# Patient Record
Sex: Female | Born: 1935 | Race: Black or African American | Hispanic: No | Smoking: Former smoker
Health system: Southern US, Community
[De-identification: ages and names within clinical notes are randomized; demographics above are authoritative.]

## PROBLEM LIST (undated history)

## (undated) DIAGNOSIS — K219 Gastro-esophageal reflux disease without esophagitis: Secondary | ICD-10-CM

## (undated) DIAGNOSIS — F329 Major depressive disorder, single episode, unspecified: Secondary | ICD-10-CM

## (undated) DIAGNOSIS — I639 Cerebral infarction, unspecified: Secondary | ICD-10-CM

## (undated) DIAGNOSIS — E215 Disorder of parathyroid gland, unspecified: Secondary | ICD-10-CM

## (undated) DIAGNOSIS — H269 Unspecified cataract: Secondary | ICD-10-CM

## (undated) DIAGNOSIS — J449 Chronic obstructive pulmonary disease, unspecified: Secondary | ICD-10-CM

## (undated) DIAGNOSIS — I10 Essential (primary) hypertension: Secondary | ICD-10-CM

## (undated) DIAGNOSIS — M1610 Unilateral primary osteoarthritis, unspecified hip: Secondary | ICD-10-CM

## (undated) HISTORY — DX: Unilateral primary osteoarthritis, unspecified hip: M16.10

## (undated) HISTORY — DX: Essential (primary) hypertension: I10

## (undated) HISTORY — DX: Major depressive disorder, single episode, unspecified: F32.9

## (undated) HISTORY — DX: Disorder of parathyroid gland, unspecified: E21.5

## (undated) HISTORY — DX: Chronic obstructive pulmonary disease, unspecified: J44.9

## (undated) HISTORY — PX: COLOSTOMY: SHX63

## (undated) HISTORY — DX: Cerebral infarction, unspecified: I63.9

## (undated) HISTORY — DX: Gastro-esophageal reflux disease without esophagitis: K21.9

## (undated) HISTORY — DX: Unspecified cataract: H26.9

---

## 1998-07-26 ENCOUNTER — Other Ambulatory Visit: Admission: RE | Admit: 1998-07-26 | Discharge: 1998-07-26 | Payer: Self-pay | Admitting: Gynecology

## 1999-09-15 ENCOUNTER — Encounter: Admission: RE | Admit: 1999-09-15 | Discharge: 1999-09-15 | Payer: Self-pay | Admitting: Gynecology

## 1999-09-15 ENCOUNTER — Other Ambulatory Visit: Admission: RE | Admit: 1999-09-15 | Discharge: 1999-09-15 | Payer: Self-pay | Admitting: Gynecology

## 1999-10-27 ENCOUNTER — Encounter (INDEPENDENT_AMBULATORY_CARE_PROVIDER_SITE_OTHER): Payer: Self-pay

## 1999-10-27 ENCOUNTER — Ambulatory Visit (HOSPITAL_COMMUNITY): Admission: RE | Admit: 1999-10-27 | Discharge: 1999-10-27 | Payer: Self-pay | Admitting: Gastroenterology

## 2000-09-27 ENCOUNTER — Encounter: Admission: RE | Admit: 2000-09-27 | Discharge: 2000-09-27 | Payer: Self-pay | Admitting: Gynecology

## 2000-09-27 ENCOUNTER — Encounter: Payer: Self-pay | Admitting: Gynecology

## 2000-09-27 ENCOUNTER — Other Ambulatory Visit: Admission: RE | Admit: 2000-09-27 | Discharge: 2000-09-27 | Payer: Self-pay | Admitting: Gynecology

## 2001-10-03 ENCOUNTER — Other Ambulatory Visit: Admission: RE | Admit: 2001-10-03 | Discharge: 2001-10-03 | Payer: Self-pay | Admitting: Gynecology

## 2001-10-03 ENCOUNTER — Encounter: Payer: Self-pay | Admitting: Gynecology

## 2001-10-03 ENCOUNTER — Encounter: Admission: RE | Admit: 2001-10-03 | Discharge: 2001-10-03 | Payer: Self-pay | Admitting: Gynecology

## 2002-01-02 ENCOUNTER — Ambulatory Visit (HOSPITAL_COMMUNITY): Admission: RE | Admit: 2002-01-02 | Discharge: 2002-01-02 | Payer: Self-pay | Admitting: Gastroenterology

## 2002-10-16 ENCOUNTER — Other Ambulatory Visit: Admission: RE | Admit: 2002-10-16 | Discharge: 2002-10-16 | Payer: Self-pay | Admitting: Gynecology

## 2002-10-16 ENCOUNTER — Encounter: Payer: Self-pay | Admitting: Gynecology

## 2002-10-16 ENCOUNTER — Encounter: Admission: RE | Admit: 2002-10-16 | Discharge: 2002-10-16 | Payer: Self-pay | Admitting: Gynecology

## 2003-08-08 ENCOUNTER — Encounter: Admission: RE | Admit: 2003-08-08 | Discharge: 2003-08-08 | Payer: Self-pay | Admitting: Cardiology

## 2003-08-08 ENCOUNTER — Encounter: Payer: Self-pay | Admitting: Cardiology

## 2003-10-22 ENCOUNTER — Encounter: Admission: RE | Admit: 2003-10-22 | Discharge: 2003-10-22 | Payer: Self-pay | Admitting: Gynecology

## 2003-10-22 ENCOUNTER — Other Ambulatory Visit: Admission: RE | Admit: 2003-10-22 | Discharge: 2003-10-22 | Payer: Self-pay | Admitting: Gynecology

## 2004-10-27 ENCOUNTER — Other Ambulatory Visit: Admission: RE | Admit: 2004-10-27 | Discharge: 2004-10-27 | Payer: Self-pay | Admitting: Gynecology

## 2004-10-27 ENCOUNTER — Encounter: Admission: RE | Admit: 2004-10-27 | Discharge: 2004-10-27 | Payer: Self-pay | Admitting: Gynecology

## 2005-01-20 ENCOUNTER — Inpatient Hospital Stay (HOSPITAL_COMMUNITY): Admission: EM | Admit: 2005-01-20 | Discharge: 2005-01-27 | Payer: Self-pay | Admitting: Emergency Medicine

## 2005-03-09 ENCOUNTER — Ambulatory Visit: Payer: Self-pay | Admitting: Family Medicine

## 2005-04-20 ENCOUNTER — Ambulatory Visit: Payer: Self-pay | Admitting: Family Medicine

## 2005-10-28 ENCOUNTER — Other Ambulatory Visit: Admission: RE | Admit: 2005-10-28 | Discharge: 2005-10-28 | Payer: Self-pay | Admitting: Gynecology

## 2005-11-25 ENCOUNTER — Encounter: Admission: RE | Admit: 2005-11-25 | Discharge: 2005-11-25 | Payer: Self-pay | Admitting: Gynecology

## 2006-04-21 ENCOUNTER — Ambulatory Visit: Payer: Self-pay | Admitting: Family Medicine

## 2006-05-04 ENCOUNTER — Encounter: Admission: RE | Admit: 2006-05-04 | Discharge: 2006-05-04 | Payer: Self-pay | Admitting: Family Medicine

## 2006-06-23 ENCOUNTER — Ambulatory Visit: Payer: Self-pay | Admitting: Family Medicine

## 2006-06-30 ENCOUNTER — Ambulatory Visit: Payer: Self-pay | Admitting: Family Medicine

## 2006-10-28 ENCOUNTER — Other Ambulatory Visit: Admission: RE | Admit: 2006-10-28 | Discharge: 2006-10-28 | Payer: Self-pay | Admitting: Gynecology

## 2007-04-11 ENCOUNTER — Ambulatory Visit: Payer: Self-pay | Admitting: Family Medicine

## 2007-04-11 LAB — CONVERTED CEMR LAB
ALT: 16 units/L (ref 0–40)
AST: 23 units/L (ref 0–37)
Basophils Relative: 0.1 % (ref 0.0–1.0)
Bilirubin, Direct: 0.1 mg/dL (ref 0.0–0.3)
CO2: 33 meq/L — ABNORMAL HIGH (ref 19–32)
Calcium: 11.2 mg/dL — ABNORMAL HIGH (ref 8.4–10.5)
Chloride: 104 meq/L (ref 96–112)
Creatinine, Ser: 0.9 mg/dL (ref 0.4–1.2)
Eosinophils Relative: 4.6 % (ref 0.0–5.0)
Glucose, Bld: 99 mg/dL (ref 70–99)
HDL: 49.2 mg/dL (ref >39.0)
Lymphocytes Relative: 23.2 % (ref 12.0–46.0)
Neutro Abs: 3.4 10*3/uL (ref 1.4–7.7)
Platelets: 231 10*3/uL (ref 150–400)
RBC: 4.14 M/uL (ref 3.87–5.11)
Total Bilirubin: 0.6 mg/dL (ref 0.3–1.2)
Total Protein: 7.6 g/dL (ref 6.0–8.3)
VLDL: 27 mg/dL (ref 0–40)
WBC: 5.6 10*3/uL (ref 4.5–10.5)

## 2007-04-19 ENCOUNTER — Ambulatory Visit: Payer: Self-pay | Admitting: Internal Medicine

## 2007-07-08 DIAGNOSIS — F3289 Other specified depressive episodes: Secondary | ICD-10-CM

## 2007-07-08 DIAGNOSIS — F32A Depression, unspecified: Secondary | ICD-10-CM | POA: Insufficient documentation

## 2007-07-08 DIAGNOSIS — K219 Gastro-esophageal reflux disease without esophagitis: Secondary | ICD-10-CM

## 2007-07-08 DIAGNOSIS — I10 Essential (primary) hypertension: Secondary | ICD-10-CM

## 2007-07-08 DIAGNOSIS — F329 Major depressive disorder, single episode, unspecified: Secondary | ICD-10-CM

## 2007-07-08 HISTORY — DX: Gastro-esophageal reflux disease without esophagitis: K21.9

## 2007-07-08 HISTORY — DX: Major depressive disorder, single episode, unspecified: F32.9

## 2007-07-08 HISTORY — DX: Essential (primary) hypertension: I10

## 2007-07-08 HISTORY — DX: Other specified depressive episodes: F32.89

## 2007-10-26 ENCOUNTER — Other Ambulatory Visit: Admission: RE | Admit: 2007-10-26 | Discharge: 2007-10-26 | Payer: Self-pay | Admitting: Gynecology

## 2008-04-18 ENCOUNTER — Ambulatory Visit: Payer: Self-pay | Admitting: Family Medicine

## 2008-04-18 DIAGNOSIS — J4489 Other specified chronic obstructive pulmonary disease: Secondary | ICD-10-CM

## 2008-04-18 DIAGNOSIS — J449 Chronic obstructive pulmonary disease, unspecified: Secondary | ICD-10-CM

## 2008-04-18 DIAGNOSIS — M1610 Unilateral primary osteoarthritis, unspecified hip: Secondary | ICD-10-CM

## 2008-04-18 DIAGNOSIS — H269 Unspecified cataract: Secondary | ICD-10-CM

## 2008-04-18 HISTORY — DX: Chronic obstructive pulmonary disease, unspecified: J44.9

## 2008-04-18 HISTORY — DX: Unspecified cataract: H26.9

## 2008-04-18 HISTORY — DX: Other specified chronic obstructive pulmonary disease: J44.89

## 2008-04-18 HISTORY — DX: Unilateral primary osteoarthritis, unspecified hip: M16.10

## 2008-04-18 LAB — CONVERTED CEMR LAB
Albumin: 3.9 g/dL (ref 3.5–5.2)
Alkaline Phosphatase: 68 units/L (ref 39–117)
BUN: 15 mg/dL (ref 6–23)
Calcium: 11.3 mg/dL — ABNORMAL HIGH (ref 8.4–10.5)
Eosinophils Absolute: 0.2 10*3/uL (ref 0.0–0.7)
Eosinophils Relative: 3.4 % (ref 0.0–5.0)
GFR calc Af Amer: 78 mL/min
GFR calc non Af Amer: 65 mL/min
HCT: 38.2 % (ref 36.0–46.0)
MCV: 94.6 fL (ref 78.0–100.0)
Monocytes Absolute: 0.6 10*3/uL (ref 0.1–1.0)
Neutro Abs: 4.2 10*3/uL (ref 1.4–7.7)
Platelets: 218 10*3/uL (ref 150–400)
Potassium: 4.1 meq/L (ref 3.5–5.1)
Protein, U semiquant: NEGATIVE
RDW: 14.4 % (ref 11.5–14.6)
Sodium: 141 meq/L (ref 135–145)
TSH: 1.01 microintl units/mL (ref 0.35–5.50)
Urobilinogen, UA: 0.2
WBC Urine, dipstick: NEGATIVE

## 2008-05-09 ENCOUNTER — Ambulatory Visit: Payer: Self-pay | Admitting: Family Medicine

## 2008-06-18 ENCOUNTER — Telehealth: Payer: Self-pay | Admitting: *Deleted

## 2008-10-29 ENCOUNTER — Encounter: Admission: RE | Admit: 2008-10-29 | Discharge: 2008-10-29 | Payer: Self-pay | Admitting: Gynecology

## 2009-08-07 ENCOUNTER — Telehealth: Payer: Self-pay | Admitting: Family Medicine

## 2009-10-09 ENCOUNTER — Encounter (INDEPENDENT_AMBULATORY_CARE_PROVIDER_SITE_OTHER): Payer: Self-pay | Admitting: *Deleted

## 2009-10-09 ENCOUNTER — Ambulatory Visit: Payer: Self-pay | Admitting: Family Medicine

## 2009-10-09 LAB — CONVERTED CEMR LAB
Bilirubin Urine: NEGATIVE
Glucose, Urine, Semiquant: NEGATIVE
Ketones, urine, test strip: NEGATIVE
Specific Gravity, Urine: 1.02
Urobilinogen, UA: 0.2
pH: 6

## 2009-10-10 ENCOUNTER — Ambulatory Visit: Payer: Self-pay | Admitting: Family Medicine

## 2009-10-10 LAB — CONVERTED CEMR LAB
ALT: 15 units/L (ref 0–35)
AST: 24 units/L (ref 0–37)
Albumin: 4.2 g/dL (ref 3.5–5.2)
Alkaline Phosphatase: 66 units/L (ref 39–117)
Basophils Absolute: 0 10*3/uL (ref 0.0–0.1)
Calcium: 11.5 mg/dL — ABNORMAL HIGH (ref 8.4–10.5)
Eosinophils Relative: 4.1 % (ref 0.0–5.0)
GFR calc non Af Amer: 89.3 mL/min (ref >60)
HCT: 39.5 % (ref 36.0–46.0)
Hemoglobin: 13.3 g/dL (ref 12.0–15.0)
Lymphocytes Relative: 25 % (ref 12.0–46.0)
Lymphs Abs: 1.2 10*3/uL (ref 0.7–4.0)
Monocytes Relative: 10.1 % (ref 3.0–12.0)
Neutro Abs: 2.8 10*3/uL (ref 1.4–7.7)
Potassium: 4.4 meq/L (ref 3.5–5.1)
RBC: 3.99 M/uL (ref 3.87–5.11)
Sodium: 139 meq/L (ref 135–145)
TSH: 0.9 microintl units/mL (ref 0.35–5.50)
WBC: 4.7 10*3/uL (ref 4.5–10.5)

## 2009-10-18 LAB — CONVERTED CEMR LAB
Calcium, Total (PTH): 11.8 mg/dL — ABNORMAL HIGH (ref 8.4–10.5)
PTH: 67.6 pg/mL (ref 14.0–72.0)

## 2010-01-20 ENCOUNTER — Ambulatory Visit: Payer: Self-pay | Admitting: Family Medicine

## 2010-01-20 LAB — CONVERTED CEMR LAB
Calcium, Total (PTH): 11.4 mg/dL — ABNORMAL HIGH (ref 8.4–10.5)
PTH: 76.1 pg/mL — ABNORMAL HIGH (ref 14.0–72.0)
Phosphorus: 3.4 mg/dL (ref 2.3–4.6)

## 2010-01-21 ENCOUNTER — Telehealth: Payer: Self-pay | Admitting: Family Medicine

## 2010-02-17 ENCOUNTER — Ambulatory Visit (HOSPITAL_COMMUNITY): Admission: RE | Admit: 2010-02-17 | Discharge: 2010-02-17 | Payer: Self-pay | Admitting: Surgery

## 2010-03-11 ENCOUNTER — Telehealth: Payer: Self-pay | Admitting: Family Medicine

## 2010-09-03 ENCOUNTER — Telehealth: Payer: Self-pay | Admitting: Family Medicine

## 2010-10-06 ENCOUNTER — Ambulatory Visit (HOSPITAL_COMMUNITY): Admission: RE | Admit: 2010-10-06 | Discharge: 2010-10-06 | Payer: Self-pay | Admitting: Surgery

## 2010-11-03 ENCOUNTER — Encounter: Payer: Self-pay | Admitting: Family Medicine

## 2010-11-03 ENCOUNTER — Ambulatory Visit: Payer: Self-pay | Admitting: Family Medicine

## 2010-11-03 LAB — CONVERTED CEMR LAB
Bilirubin Urine: NEGATIVE
Glucose, Urine, Semiquant: NEGATIVE
Ketones, urine, test strip: NEGATIVE
Nitrite: NEGATIVE
Protein, U semiquant: NEGATIVE
Specific Gravity, Urine: 1.02
Urobilinogen, UA: 0.2
pH: 6

## 2010-11-04 LAB — CONVERTED CEMR LAB
AST: 24 units/L (ref 0–37)
Alkaline Phosphatase: 94 units/L (ref 39–117)
BUN: 25 mg/dL — ABNORMAL HIGH (ref 6–23)
Basophils Absolute: 0.1 10*3/uL (ref 0.0–0.1)
Bilirubin, Direct: 0 mg/dL (ref 0.0–0.3)
Calcium: 12.1 mg/dL — ABNORMAL HIGH (ref 8.4–10.5)
Eosinophils Relative: 4.3 % (ref 0.0–5.0)
GFR calc non Af Amer: 52.72 mL/min (ref >30.00)
Glucose, Bld: 94 mg/dL (ref 70–99)
Lymphocytes Relative: 23.3 % (ref 12.0–46.0)
Lymphs Abs: 1.5 10*3/uL (ref 0.7–4.0)
Monocytes Relative: 10 % (ref 3.0–12.0)
Neutrophils Relative %: 61.6 % (ref 43.0–77.0)
Platelets: 233 10*3/uL (ref 150.0–400.0)
RDW: 14 % (ref 11.5–14.6)
TSH: 0.87 microintl units/mL (ref 0.35–5.50)
Total Bilirubin: 0.6 mg/dL (ref 0.3–1.2)
WBC: 6.3 10*3/uL (ref 4.5–10.5)

## 2010-11-07 ENCOUNTER — Encounter: Payer: Self-pay | Admitting: Family Medicine

## 2010-11-07 ENCOUNTER — Ambulatory Visit: Payer: Self-pay | Admitting: Family Medicine

## 2010-11-14 DIAGNOSIS — E215 Disorder of parathyroid gland, unspecified: Secondary | ICD-10-CM

## 2010-11-14 HISTORY — DX: Disorder of parathyroid gland, unspecified: E21.5

## 2010-11-14 LAB — CONVERTED CEMR LAB: Calcium, Total (PTH): 11.6 mg/dL — ABNORMAL HIGH (ref 8.4–10.5)

## 2010-12-30 NOTE — Progress Notes (Signed)
Summary: fyi  Phone Note Call from Patient Call back at Home Phone [phone]   Caller: Patient-----voicemail Summary of Call: Need to speak to nurse about some tests? Initial call taken by: Warnell Forester,  March 11, 2010 3:54 PM  Follow-up for Phone Call        patient is calling for results from Dr Tenna Child office.  Additional Follow-up for Phone Call Additional follow up Details #1::        I advised patient to call Dr Tenna Child office in the morning. Additional Follow-up by: Kern Reap CMA Duncan Dull),  March 11, 2010 5:10 PM

## 2010-12-30 NOTE — Progress Notes (Signed)
  Phone Note Outgoing Call   Summary of Call: Adriana Middleton's calcium is elevated, and her PTH is increasing, therefore, referred to Dr. Cicero Duck for consultation regarding hyperparathyroidism Initial call taken by: Roderick Pee MD,  January 21, 2010 1:39 PM

## 2010-12-30 NOTE — Assessment & Plan Note (Signed)
Summary: emp/pt fasting/cjr/pt rescd from bump//ccm   Vital Signs:  Patient profile:   77 year old female Menstrual status:  postmenopausal Height:      61.5 inches Weight:      152 pounds BMI:     28.36 O2 Sat:      97 % on Room air Temp:     98.2 degrees F oral Pulse rate:   72 / minute BP sitting:   140 / 88  (left arm) Cuff size:   regular  Vitals Entered By: Kathlene November LPN (November 03, 2010 10:13 AM)  O2 Flow:  Room air CC: CPE no pap   CC:  CPE no pap.  History of Present Illness: Nil is a 77 year old female, who comes in today for evaluation of hypertension.  She takes Tenoretic one half tab daily BP 140/88.  She states she had a Pap smear a week ago, however, she 77 years old.  Has had no history of GYN problems, and, therefore, really doesn't need a Pap.  She states she gets routine eye care, dental care, last mammogram two years ago, does not check her breasts monthly, declines any flu shot tetanus or Pneumovax, and basically says she doesn't want to be examined.  She does want her medicine renewed.  I explained to her that does not have we do things that we do a complete physical examination.  She has a history of vague allergies and still declines to take any vaccinations  Current Medications (verified): 1)  Aspirin 81 Mg  Tabs (Aspirin) .... Take 1 Tablet By Mouth Once A Day 2)  Tylenol 325 Mg  Tabs (Acetaminophen) .... As Needed 3)  B-12 250 Mcg  Tabs (Cyanocobalamin) .... Once Daily 4)  Tenoretic 50 50-25 Mg  Tabs (Atenolol-Chlorthalidone) .... 1/2 Qam 5)  Multivitamins  Tabs (Multiple Vitamin) .... Once Daily  Allergies (verified): 1)  ! Codeine 2)  ! Zocor  Comments:  Nurse/Medical Assistant: The patient's medications and allergies were reviewed with the patient and were updated in the Medication and Allergy Lists. Kathlene November LPN (November 03, 2010 10:14 AM)  Past History:  Past medical, surgical, family and social histories (including risk  factors) reviewed, and no changes noted (except as noted below).  Past Medical History: Reviewed history from 07/08/2007 and no changes required. GERD Hypertension Depression  Past Surgical History: Reviewed history from 07/08/2007 and no changes required. Colostomy-partial  Family History: Reviewed history from 07/08/2007 and no changes required. Family History Diabetes 1st degree relative Family History Hypertension Family History of Prostate CA 1st degree relative <50 Family History of Cardiovascular disorder  Social History: Reviewed history from 07/08/2007 and no changes required. Retired Former Smoker Alcohol use-yes Drug use-no Regular exercise-no  Review of Systems      See HPI  Physical Exam  General:  Well-developed,well-nourished,in no acute distress; alert,appropriate and cooperative throughout examination Head:  Normocephalic and atraumatic without obvious abnormalities. No apparent alopecia or balding. Eyes:  No corneal or conjunctival inflammation noted. EOMI. Perrla. Funduscopic exam benign, without hemorrhages, exudates or papilledema. Vision grossly normal. Ears:  External ear exam shows no significant lesions or deformities.  Otoscopic examination reveals clear canals, tympanic membranes are intact bilaterally without bulging, retraction, inflammation or discharge. Hearing is grossly normal bilaterally. Nose:  External nasal examination shows no deformity or inflammation. Nasal mucosa are pink and moist without lesions or exudates. Mouth:  Oral mucosa and oropharynx without lesions or exudates.  Teeth in good repair. Neck:  No deformities, masses, or tenderness noted. Chest Wall:  No deformities, masses, or tenderness noted. Breasts:  No mass, nodules, thickening, tenderness, bulging, retraction, inflamation, nipple discharge or skin changes noted.   Lungs:  Normal respiratory effort, chest expands symmetrically. Lungs are clear to auscultation, no crackles  or wheezes. Heart:  Normal rate and regular rhythm. S1 and S2 normal without gallop, murmur, click, rub or other extra sounds. Abdomen:  Bowel sounds positive,abdomen soft and non-tender without masses, organomegaly or hernias noted. Msk:  No deformity or scoliosis noted of thoracic or lumbar spine.   Pulses:  R and L carotid,radial,femoral,dorsalis pedis and posterior tibial pulses are full and equal bilaterally Extremities:  No clubbing, cyanosis, edema, or deformity noted with normal full range of motion of all joints.   Neurologic:  No cranial nerve deficits noted. Station and gait are normal. Plantar reflexes are down-going bilaterally. DTRs are symmetrical throughout. Sensory, motor and coordinative functions appear intact. Skin:  Intact without suspicious lesions or rashes Cervical Nodes:  No lymphadenopathy noted Axillary Nodes:  No palpable lymphadenopathy Inguinal Nodes:  No significant adenopathy Psych:  Cognition and judgment appear intact. Alert and cooperative with normal attention span and concentration. No apparent delusions, illusions, hallucinations   Impression & Recommendations:  Problem # 1:  HYPERTENSION (ICD-401.9) Assessment Improved  Her updated medication list for this problem includes:    Tenoretic 50 50-25 Mg Tabs (Atenolol-chlorthalidone) .Marland Kitchen... 1/2 qam  Orders: Venipuncture (04540) Prescription Created Electronically [phone]) Urinalysis-dipstick only (Medicare patient) (14782NF) Specimen Handling (62130) TLB-BMP (Basic Metabolic Panel-BMET) (80048-METABOL) TLB-CBC Platelet - w/Differential (85025-CBCD) TLB-Hepatic/Liver Function Pnl (80076-HEPATIC) TLB-TSH (Thyroid Stimulating Hormone) (84443-TSH)  Complete Medication List: 1)  Aspirin 81 Mg Tabs (Aspirin) .... Take 1 tablet by mouth once a day 2)  Tylenol 325 Mg Tabs (Acetaminophen) .... As needed 3)  B-12 250 Mcg Tabs (Cyanocobalamin) .... Once daily 4)  Tenoretic 50 50-25 Mg Tabs  (Atenolol-chlorthalidone) .... 1/2 qam 5)  Multivitamins Tabs (Multiple vitamin) .... Once daily  Patient Instructions: 1)  continue current medications follow-up in one year. 2)  I would recommend you update your vaccinations. 3)  Take calcium and vitamin D, and aspirin daily for bone strength 4)  Schedule your mammogram. 5)  Schedule a colonoscopy/sigmoidoscopy to help detect colon cancer. Prescriptions: TENORETIC 50 50-25 MG  TABS (ATENOLOL-CHLORTHALIDONE) 1/2 qam  #100 x 1   Entered and Authorized by:   Roderick Pee MD   Signed by:   Roderick Pee MD on 11/03/2010   Method used:   Electron[facility] to        [facility] [phone]* (retail)       [address]       [place], [place]  [address]       Ph: [phone] or [phone]       Fax: [phone]   RxID:   [id number]    Orders Added: 1)  Venipuncture [60630] 2)  Prescription Created Electronically [G8553] 3)  Est. Patient Level IV [16010] 4)  Urinalysis-dipstick only (Medicare patient) [81003QW] 5)  Specimen Handling [99000] 6)  TLB-BMP (Basic Metabolic Panel-BMET) [80048-METABOL] 7)  TLB-CBC Platelet - w/Differential [85025-CBCD] 8)  TLB-Hepatic/Liver Function Pnl [80076-HEPATIC] 9)  TLB-TSH (Thyroid Stimulating Hormone) [84443-TSH]    Laboratory Results   Urine Tests    Routine Urinalysis   Color: yellow Appearance: Clear Glucose: negative   (Normal Range: Negative) Bilirubin: negative   (Normal Range: Negative) Ketone: negative   (  Normal Range: Negative) Spec. Gravity: 1.020   (Normal Range: 1.003-1.035) Blood: trace-lysed   (Normal Range: Negative) pH: 6.0   (Normal Range: 5.0-8.0) Protein: negative   (Normal Range: Negative) Urobilinogen: 0.2   (Normal Range: 0-1) Nitrite: negative   (Normal Range: Negative) Leukocyte Esterace: 1+   (Normal Range: Negative)    Comments: Rita Ohara  November 03, 2010 1:51 PM

## 2010-12-30 NOTE — Progress Notes (Signed)
Summary: Refill Atenolol-Chlorthal  Phone Note From Pharmacy      Prescriptions: TENORETIC 50 50-25 MG  TABS (ATENOLOL-CHLORTHALIDONE) 1/2 qam  #100 x 1   Entered by:   Kathrynn Speed CMA   Authorized by:   Roderick Pee MD   Signed by:   Kathrynn Speed CMA on 09/03/2010   Method used:   [facility]        [facility]  [address] [phone]* (retail)       [address]       [place], [place]  [address]       Ph: [phone] or [phone]       Fax: [phone]   RxID:   [id number]

## 2011-03-30 ENCOUNTER — Other Ambulatory Visit (HOSPITAL_COMMUNITY): Payer: Self-pay | Admitting: Ophthalmology

## 2011-03-30 ENCOUNTER — Encounter (HOSPITAL_COMMUNITY)
Admission: RE | Admit: 2011-03-30 | Discharge: 2011-03-30 | Disposition: A | Discharge status: Home / Self Care | Payer: Medicare Other | Source: Ambulatory Visit | Attending: Ophthalmology | Admitting: Ophthalmology

## 2011-03-30 ENCOUNTER — Ambulatory Visit (HOSPITAL_COMMUNITY)
Admission: RE | Admit: 2011-03-30 | Discharge: 2011-03-30 | Disposition: A | Discharge status: Home / Self Care | Payer: Medicare Other | Source: Ambulatory Visit | Attending: Ophthalmology | Admitting: Ophthalmology

## 2011-03-30 DIAGNOSIS — H269 Unspecified cataract: Secondary | ICD-10-CM | POA: Insufficient documentation

## 2011-03-30 DIAGNOSIS — Z01818 Encounter for other preprocedural examination: Secondary | ICD-10-CM | POA: Insufficient documentation

## 2011-03-30 DIAGNOSIS — Z01812 Encounter for preprocedural laboratory examination: Secondary | ICD-10-CM | POA: Insufficient documentation

## 2011-03-30 DIAGNOSIS — I517 Cardiomegaly: Secondary | ICD-10-CM | POA: Insufficient documentation

## 2011-03-30 LAB — BASIC METABOLIC PANEL
Chloride: 102 mEq/L (ref 96–112)
GFR calc non Af Amer: 41 mL/min — ABNORMAL LOW (ref >60)
Glucose, Bld: 93 mg/dL (ref 70–99)
Potassium: 3.8 mEq/L (ref 3.5–5.1)
Sodium: 138 mEq/L (ref 135–145)

## 2011-03-30 LAB — CBC
HCT: 40.7 % (ref 36.0–46.0)
RBC: 4.36 MIL/uL (ref 3.87–5.11)
RDW: 13.5 % (ref 11.5–15.5)
WBC: 7.5 10*3/uL (ref 4.0–10.5)

## 2011-04-01 ENCOUNTER — Ambulatory Visit (HOSPITAL_COMMUNITY)
Admission: RE | Admit: 2011-04-01 | Discharge: 2011-04-01 | Disposition: A | Discharge status: Home / Self Care | Payer: Medicare Other | Source: Ambulatory Visit | Attending: Ophthalmology | Admitting: Ophthalmology

## 2011-04-01 DIAGNOSIS — J449 Chronic obstructive pulmonary disease, unspecified: Secondary | ICD-10-CM | POA: Insufficient documentation

## 2011-04-01 DIAGNOSIS — I10 Essential (primary) hypertension: Secondary | ICD-10-CM | POA: Insufficient documentation

## 2011-04-01 DIAGNOSIS — H269 Unspecified cataract: Secondary | ICD-10-CM | POA: Insufficient documentation

## 2011-04-01 DIAGNOSIS — J4489 Other specified chronic obstructive pulmonary disease: Secondary | ICD-10-CM | POA: Insufficient documentation

## 2011-04-14 NOTE — Assessment & Plan Note (Signed)
[facility]                             PULMONARY OFFICE NOTE   NAME:Adriana Middleton, Adriana Middleton                         MRN:          [id number]  DATE:04/19/2007                            DOB:          07/17/1930    REASON FOR CONSULTATION:  Cough/dyspnea.   HISTORY:  A 77 year old black female states she has had problems with  seasonal rhinitis for years associated with a cough immediately after  lying down that has been waxing and waning for the last 5-10 years.  The  cough waxes and wanes but has been worse this spring than ever but now  it is all better.  When it is bad, she coughs so hard she gags and  vomits and also notices urinary incontinence.  She also loses her breath  during coughing paroxysms.   The patient denies any improvement with Claritin.  Has not been treated  with prednisone or PPI therapy.   PAST MEDICAL HISTORY:  Significant for hypertension.  Patient denies  that she is on any ACE inhibitors.   ALLERGIES:  None known.   Medications include Celexa  and Tenoretic.   SOCIAL HISTORY:  She quit smoking eight years ago and had no pulmonary  symptoms then.  She is retired with no unusual travel, pet, or hobby  exposure.   FAMILY HISTORY:  Recorded in detail and negative for atypia or asthma.   REVIEW OF SYSTEMS:  Taken in detail on the work sheet and essentially  negative except as outlined above.   PHYSICAL EXAMINATION:  GENERAL:  This is a pleasant ambulatory black  female in no acute distress.  VITAL SIGNS:  She is afebrile with normal vital signs.  HEENT:  Minimal turbinate edema but no cyanosis, pallor, or polyps.  Oropharynx is clear.  No evidence of excessive postnasal drainage.  NECK:  Supple without cervical adenopathy or tenderness.  Trachea is  midline.  No thyromegaly.  LUNGS:  Perfectly clear bilaterally to auscultation and percussion.  HEART:  There is a regular rhythm without murmur, rub or gallop.  ABDOMEN:  Soft,  benign.  EXTREMITIES:  Warm without calf tenderness, clubbing, cyanosis or edema.   X-ray is not available for review.   IMPRESSION:  A chronic cough that dates back 5-10 years and waxes and  wanes with seasonal variation to initially suggest an allergic  mechanism; however, the more I talk with the patient, the more vague she  became about the exact setting in which she notes aggravation of chronic  cough.   DISCUSSION:  She decided that the cough comes and goes with no pattern  at all.  The fact that it does not respond to antihistamines rules  against allergic postnasal drip syndrome, although note the  antihistamines she has used are the newer generation antihistamines with  no anticholinergic properties, and therefore, we have not totally  excluded an intermittent postnasal drip syndrome from the differential.  The most likely explanation for the cough, although she does have mild  seasonal rhinitis, she has developed reflux as a secondary problem and  needs to  therefore try the following strategy:  Since she is better  now, all I would recommend is that she observe a prudent diet, which  includes the avoidance of cough drops (because they frequently contain  mint and/or menthol) and follow the diet I reviewed with her.  If the  cough begins to bother her, I have recommended a trial of Prilosec 20 mg  dose 30 minutes before supper (because the cough seems worse in the  evening) for at least a two week trial and then return here at the end  of two weeks if not impressed that it is helping.  One option might be  to proceed directly to a methacholine challenge test in that event (the  other option would be to add an anticholinergic antihistamine by  chlorpheniramine or brompheniramine, as per the National Cough  Guidelines, to rule out postnasal drip syndrome in the differential).     Charlaine Dalton. Sherene Sires, MD, [facility]  Electronically Signed    MBW/MedQ  DD: 04/19/2007  DT: 04/20/2007   Job #: [id number]   cc:   Tinnie Gens A. Tawanna Cooler, MD

## 2011-04-15 NOTE — Op Note (Signed)
  [name], Adriana Middleton                  ACCOUNT NO.:  [id number]  MEDICAL RECORD NO.:  [id number]           PATIENT TYPE:  O  LOCATION:  SDSC                         FACILITY:  [facility]  PHYSICIAN:  Shade Flood, MD       DATE OF BIRTH:  01/29/1930  DATE OF PROCEDURE:  04/01/2011 DATE OF DISCHARGE:  04/01/2011                              OPERATIVE REPORT   PREOPERATIVE DIAGNOSIS:  Cataract, right eye.  POSTOPERATIVE DIAGNOSIS:  Cataract, right eye.  PROCEDURE PERFORMED:  Phacoemulsification with posterior chamber intraocular lens, right eye.  There were no complications.  No specimens for pathology.  The patient was prepared and draped in the usual fashion for ocular surgery on the right eye and a solid lid speculum was placed.  A peripheral clear corneal groove was made with a Grieshaber 01 blade and then the keratome was used to make a shelving self-sealing incision into the anterior chamber.  Provisc was instilled and a bent 26-gauge needlewas used to perform a capsulorrhexis.  The lens was hydrodissected using balanced salt solution and the Chang chopper was used to rotate the lens within the capsular bag to ensure adequate mobility.  A separate stab incision was made at that 2:30 meridian of the cornea and the Chang chopper and phaco handpiece were inserted.  A combined phaco/chop technique was employed fracturing the lens into 4 sections and each section was then removed with the phacoemulsification handpiece.    The irrigation-aspiration cannula was then used to remove cortex from the capsular bag.  Provisc was then again instilled to distend the posterior capsule bag and deepen the anterior chamber.  The Monarch injector was then used to place folded AcrySof MA50BM intraocular lens into the capsular bag.  The trailing haptic was dialed in with the Sinskey lens hook.  The IA cannula was then used to remove viscoelastic from the anterior chamber and the wound was closed with an  interrupted 10-0 nylon suture.  The patient was given 100 mg of Ancef in the subconjunctival space with the needle directly visualized and then, the patient was additionally given 4 mg of Decadron in the subconjunctival space with the need directly visualized.  The lid speculum was removed and the patient's eye was patched with polymyxin/bacitracin ointment.  A plastic shield was placed and she was transferred alert and conversant from the operating room to the short-stay recovery area.          ______________________________ Shade Flood, MD     GG/MEDQ  D:  04/01/2011  T:  04/02/2011  Job:  [id number]  Electronically Signed by Shade Flood MD on 04/15/2011 08:50:38 AM

## 2011-04-17 NOTE — H&P (Signed)
Adriana Middleton, Adriana Middleton                  ACCOUNT NO.:  [id number]   MEDICAL RECORD NO.:  [id number]          PATIENT TYPE:  EMS   LOCATION:  MAJO                         FACILITY:  [facility]   PHYSICIAN:  Sharlet Salina T. Hoxworth, M.D.DATE OF BIRTH:  1930/06/20   DATE OF ADMISSION:  01/20/2005  DATE OF DISCHARGE:                                HISTORY & PHYSICAL   CHIEF COMPLAINT:  Abdominal pain.   HISTORY OF PRESENT ILLNESS:  Adriana Middleton is a 77 year old black female who  presents to the [facility] Emergency Room with a 24 hour history of  abdominal pain.  She was awakened last night with lower abdominal pain and  had frequent nausea and vomiting.  The pain continued.  She presented today.  She describes pain across her lower abdomen, right and left.  She remains  nauseated.  No fever or chills.  She has a history of diverticulitis and  suspected she had the same problem.  She has not had any recent abdominal  pain, change of bowel habits.   PAST MEDICAL HISTORY:   SURGERY:  1.  Status post colectomy for diverticulitis, about 1994.  2.  She states she has also had a laparotomy for postoperative bowel      obstruction in 1995.  3.  She has had lumbar back surgery x 2.   MEDICALLY:  She is treated for hypertension.   Her primary is Dr. Shana Chute.   MEDICATIONS:  Avalide 12.5/300, one daily.   ALLERGIES:  CODEINE.   SOCIAL HISTORY:  She is single.  Does not smoke cigarettes.  Drinks an  occasional drink.   FAMILY HISTORY:  Noncontributory.   REVIEW OF SYSTEMS:  GENERAL:  No fever, chills, malaise, weight change.  RESPIRATORY:  Denies shortness of breath, cough, wheezing.  CARDIAC:  Denies  cardiac disease, chest pain, palpitations.  ABDOMEN:  GI as above.  GU:  No  urinary burning, frequency, blood.  MUSCULOSKELETAL:  Positive for chronic  joint pain.   PHYSICAL EXAMINATION:  VITAL SIGNS:  Temperature is 99, pulse 93,  respirations 18, blood pressure 152/85.  GENERAL:  A mildly  overweight, alert, black female in no acute distress.  SKIN:  Warm and dry without rash or infection.  HEENT:  No palpable masses or thyromegaly.  Sclerae nonicteric.  LUNGS:  Clear to auscultation without increased work of breathing or  wheezing.  CARDIAC:  Regular rate and rhythm without murmurs.  No edema.  No JVD.  LYMPH NODES:  No cervical, supraclavicular, or inguinal nodes palpable.  ABDOMEN:  Mildly distended.  Well healed incision.  No hernias.  There is  moderate lower abdominal tenderness, greatest in the midline, equal  bilaterally with some guarding but no peritoneal signs, no masses, no  organomegaly appreciated.  EXTREMITIES:  No joint swelling, deformity.  NEUROLOGIC:  Alert and oriented.  Motor and sensory exams are grossly  normal.   LABORATORY:  Electrolytes, LFTs normal.  CBC normal.  Urinalysis negative.  Flat and upright abdomen with chest x-ray negative.  CT scan of the abdomen  and pelvis is reviewed which  shows definite evidence of acute appendicitis  without apparent abscess or perforation.   ASSESSMENT:  Acute appendicitis.   PLAN:  The patient will require appendectomy.  With her previous extensive  abdominal surgery, we will plan to do this with an open right lower quadrant  incision.  She is admitted for this procedure.  She has received broad  spectrum antibiotics.      BTH/MEDQ  D:  01/20/2005  T:  01/20/2005  Job:  [id number]

## 2011-04-17 NOTE — Discharge Summary (Signed)
[name], Adriana Middleton                  ACCOUNT NO.:  [id number]   MEDICAL RECORD NO.:  [id number]          PATIENT TYPE:  INP   LOCATION:  5736                         FACILITY:  [facility]   PHYSICIAN:  Guy Franco, P.A.       DATE OF BIRTH:  1930/05/17   DATE OF ADMISSION:  01/20/2005  DATE OF DISCHARGE:  01/27/2005                                 DISCHARGE SUMMARY   DISCHARGE DIAGNOSES:  1.  Small bowel obstruction, status post laparotomy and lysis of adhesions      for small bowel obstruction.  2.  Hypertension.  3.  History of diverticulosis in 1994 with colectomy.  4.  History of postoperative bowel obstruction in 1995 with subsequent      laparotomy.  5.  Status post back surgery times two.   HOSPITAL COURSE:  Adriana Middleton is a 77 year old female who presented to [facility] with a 24 hour history of abdominal pain, nausea, and vomiting. Her  pain was diffusely over the lower right quadrant. She does have a history of  diverticulitis and the patient thought she may have the same problem. CT  scan of the abdomen and pelvis were reviewed and showed definite evidence of  acute appendicitis with no apparent abscess or perforation. A flat and  upright abdomen was negative.   The patient went to the operating room under the care of Dr. Glenna Fellows. Intraoperatively, she was found to have a small bowel obstruction  and underwent a laparotomy and lysis of adhesions for such obstruction.  Intraoperatively, it was also noted that the appendix had been previously  removed.   The patient was taken to the recovery room in stable condition. Over the  next several days she began to eat and gradually began functioning with good  flatus and she was able to eat.   By postoperative day #7, she was felt to be ready for discharge home. She  was eating without problems, she was walking without problems, and was not  in any pain.   Laboratory studies during her stay included a sodium of 132,  potassium 3.5,  BUN 4, creatinine 0.8, white count 6.5, hemoglobin 11.9, hematocrit 34.4,  platelet count 268,000.   DISCHARGE MEDICATIONS:  She is to resume her Avalide one tablet daily, may  use Tylenol as needed for pain.   No straining or lifting over 10 pounds. Remain on a low fat diet. Clean her  incision with soap and water. No scrubbing. She is to call [phone] for  questions or concerns. She is to follow up with Dr. Odie Sera on February 19, 2005, at 10:30 a.m.      LB/MEDQ  D:  01/27/2005  T:  01/27/2005  Job:  [id number]   cc:   Lorne Skeens. Hoxworth, M.D.  [address]  [place]  [place] [address]   Osvaldo Shipper. Spruill, M.D.  [address] [address]  [place]  [place] [address]  Fax: [phone]

## 2011-04-17 NOTE — Op Note (Signed)
Adriana Middleton, Adriana Middleton                  ACCOUNT NO.:  [id number]   MEDICAL RECORD NO.:  [id number]          PATIENT TYPE:  INP   LOCATION:  1850                         FACILITY:  [facility]   PHYSICIAN:  Sharlet Salina T. Hoxworth, M.D.DATE OF BIRTH:  25-Jun-1930   DATE OF PROCEDURE:  01/20/2005  DATE OF DISCHARGE:                                 OPERATIVE REPORT   PREOPERATIVE DIAGNOSIS:  Acute appendicitis.   POSTOPERATIVE DIAGNOSIS:  Small-bowel obstruction.   SURGICAL PROCEDURES:  Laparotomy and lysis of adhesions for small bowel  obstruction.   SURGEON:  Lorne Skeens. Hoxworth, M.D.   ASSISTANT:  Gita Kudo, M.D.   ANESTHESIA:  General.   BRIEF HISTORY:  Adriana Middleton is a 77 year old black female with a previous  history of diverticulitis with colon resection and then laparotomy for small  bowel obstruction some years ago.  She presents with 24 hours of history of  diffuse lower abdominal pain and nausea and vomiting.  She has had a CT scan  from the emergency room has been interpreted as showing acute appendicitis.  She has fairly diffuse lower abdominal tenderness.  With these findings, I  recommended proceeding with appendectomy.  The nature of the  procedure,  indications, risks of bleeding, infection were discussed and understood  preoperatively.  She was brought out through this procedure.   DESCRIPTION OF OPERATION:  The patient was brought to the operating room and  placed in supine position on the operating table and general endotracheal  anesthesia was induced.  She received preoperative broad-spectrum  antibiotics.  The abdomen was widely sterilely prepped and draped.  An oblique incision  was made over McBurney's point and dissection carried down through  subcutaneous tissue.  External oblique was divided along the lines of its  fibers.  The internal oblique was bluntly split along the lines of its  fibers and the transversus peritoneum elevated and the peritoneum  entered  under direct vision.  There was a moderate amount of straw-colored  peritoneal fluid.  The cecum was brought up into the wound and it was seen  that the appendix had been previously removed.  There were quite a few  adhesions in the lower abdomen that made exploration through this small  right lower quadrant incision difficult, but I was able to see that there  were some quite thickened loops of small bowel with adhesions down into the  pelvis and some more dilated proximal small bowel.  In the operating room  the CT scan was reviewed again with the radiologist on the phone, and it was  felt that what had been identified as the appendix on the CT scan likely was  a thickened loop of terminal ileum and that there was moderately dilated  proximal small bowel consistent with a small bowel obstruction.  With these  findings and taking to account the patient's significant pain and tenderness  preoperatively, I felt that the wisest course was to proceed with laparotomy  for her small-bowel obstruction.  This could not be accomplished through the  small right lower quadrant incision, and therefore  a low midline incision  was used.  The abdomen had been widely prepped.  Dissection was carried down  through the subcutaneous tissue and midline fascia.  There were adhesions of the omentum to the anterior abdominal wall that were  all lysed.  There were quite extensive small bowel adhesions in the pelvis.  These were carefully sequentially lysed, which took approximately an hour  and half of dissection to get all this freed up.  The terminal ileum was  densely adherent down to the peritoneum around the bladder and tubes and  actually contained solid fecal material at this point and was quite  thickened.  The bowel more proximal to this was clearly dilated consistent  with a fairly high-grade obstruction.  All small bowel adhesions were  completely lysed from the terminal ileum to the ligament  of Treitz.  The  solid material in the terminal ileum was milked distally into the right  colon.  The left colon and previous anastomosis could be seen, and there was  no evidence of diverticulitis or abnormality here.  Following this, the  abdomen was copiously irrigated with warm saline.  The viscera were returned  to their anatomic position.  The NG tube was confirmed in position in the  stomach.  The right lower quadrant incision was then closed in two layers  with running 0 PDS on the peritoneum and transversus and then a running #1  PDS on the external oblique.  The midline incision was closed with running  #1 PDS begun at either end of the incision and tied centrally.  The  subcutaneous tissue was irrigated and both incisions closed with staples.  Sponge, needle and instrument counts were correct.  Dry sterile dressings  were applied, and the patient taken to the recovery room in good condition.      BTH/MEDQ  D:  01/20/2005  T:  01/21/2005  Job:  [id number]

## 2011-10-13 ENCOUNTER — Other Ambulatory Visit: Payer: Self-pay | Admitting: Family Medicine

## 2011-10-13 MED ORDER — ATENOLOL-CHLORTHALIDONE 50-25 MG PO TABS: 1.0000 | ORAL_TABLET | Freq: Every day | ORAL | Status: DC

## 2011-10-13 NOTE — Telephone Encounter (Signed)
Addended by: Duard Brady I on: 10/13/2011 05:38 PM   Modules accepted: Orders

## 2011-10-13 NOTE — Telephone Encounter (Signed)
Pt need refill on tenoretic 50-25 mg call into [address] [phone].Pt is out

## 2011-11-23 ENCOUNTER — Other Ambulatory Visit: Payer: Self-pay | Admitting: Family Medicine

## 2011-11-27 ENCOUNTER — Encounter: Payer: Self-pay | Admitting: Family Medicine

## 2011-11-27 ENCOUNTER — Encounter: Payer: Self-pay | Admitting: *Deleted

## 2011-12-03 ENCOUNTER — Ambulatory Visit (INDEPENDENT_AMBULATORY_CARE_PROVIDER_SITE_OTHER): Payer: Medicare Other | Admitting: Family Medicine

## 2011-12-03 ENCOUNTER — Encounter: Payer: Self-pay | Admitting: Family Medicine

## 2011-12-03 DIAGNOSIS — F329 Major depressive disorder, single episode, unspecified: Secondary | ICD-10-CM

## 2011-12-03 DIAGNOSIS — H269 Unspecified cataract: Secondary | ICD-10-CM

## 2011-12-03 DIAGNOSIS — I10 Essential (primary) hypertension: Secondary | ICD-10-CM

## 2011-12-03 LAB — CBC WITH DIFFERENTIAL/PLATELET
Eosinophils Relative: 3.2 % (ref 0.0–5.0)
HCT: 39.3 % (ref 36.0–46.0)
Hemoglobin: 13.2 g/dL (ref 12.0–15.0)
Lymphs Abs: 1.4 10*3/uL (ref 0.7–4.0)
Monocytes Relative: 8.5 % (ref 3.0–12.0)
Neutro Abs: 5.1 10*3/uL (ref 1.4–7.7)
RDW: 13.7 % (ref 11.5–14.6)
WBC: 7.4 10*3/uL (ref 4.5–10.5)

## 2011-12-03 LAB — HEPATIC FUNCTION PANEL
ALT: 12 U/L (ref 0–35)
AST: 24 U/L (ref 0–37)
Albumin: 4.2 g/dL (ref 3.5–5.2)

## 2011-12-03 LAB — POCT URINALYSIS DIPSTICK
Bilirubin, UA: NEGATIVE
Blood, UA: NEGATIVE
Ketones, UA: NEGATIVE
pH, UA: 6.5

## 2011-12-03 LAB — BASIC METABOLIC PANEL
BUN: 25 mg/dL — ABNORMAL HIGH (ref 6–23)
Creatinine, Ser: 1.2 mg/dL (ref 0.4–1.2)
GFR: 55.09 mL/min — ABNORMAL LOW (ref >60.00)
Glucose, Bld: 95 mg/dL (ref 70–99)
Potassium: 5.3 mEq/L — ABNORMAL HIGH (ref 3.5–5.1)

## 2011-12-03 MED ORDER — ATENOLOL-CHLORTHALIDONE 50-25 MG PO TABS: 1.0000 | ORAL_TABLET | Freq: Every day | ORAL | Status: DC

## 2011-12-03 NOTE — Patient Instructions (Signed)
Continue your blood pressure medication daily.  Walk outside at 20 minutes daily.  Use the earwax dissolving drops.......Marland Kitchen Two drops in each ear canal at bedtime for two weeks then returned for removal or  the earwax kit.  There is a bulb syringe which you can try to flush your ears, yourself with warm water  Return in one year, sooner if any problem.  Remember to call in October for a  January appointment

## 2011-12-03 NOTE — Progress Notes (Signed)
  Subjective:    Patient ID: Adriana Middleton, female    DOB: 11/09/1930, 78 y.o.   MRN: [id number]  [name] is a 78 year old single female, ex-smoker, who comes in today for Medicare wellness examination.  She has a history of underlying hypertension, for which he takes Tenoretic, one tablet daily.  BP today 132/78.  She recently had her right cataract removed.  She declines a flu vaccine, Pneumovax, tetanus booster 2006, declines a shingles, flexing.  She gets routine eye care as noted above, no dental care, her hearing is diminished, cognitive function, normal.  She does own finances.  Home health safety reviewed.  No issues identified.  No guns in the house.  She does not have a healthcare power of attorney, nor a living well.  She does not exercise on a regular basis.    Review of Systems  Constitutional: Negative.   HENT: Negative.   Eyes: Negative.   Respiratory: Negative.   Cardiovascular: Negative.   Gastrointestinal: Negative.   Genitourinary: Negative.   Musculoskeletal: Negative.   Neurological: Negative.   Hematological: Negative.   Psychiatric/Behavioral: Negative.        Objective:   Physical Exam  Constitutional: She appears well-developed and well-nourished.  HENT:  Head: Normocephalic and atraumatic.  Right Ear: External ear normal.  Left Ear: External ear normal.  Nose: Nose normal.  Mouth/Throat: Oropharynx is clear and moist.  Eyes: EOM are normal. Pupils are equal, round, and reactive to light.  Neck: Normal range of motion. Neck supple. No thyromegaly present.  Cardiovascular: Normal rate, regular rhythm, normal heart sounds and intact distal pulses.  Exam reveals no gallop and no friction rub.   No murmur heard. Pulmonary/Chest: Effort normal and breath sounds normal.  Abdominal: Soft. Bowel sounds are normal. She exhibits no distension and no mass. There is no tenderness. There is no rebound.  Musculoskeletal: Normal range of motion.  Lymphadenopathy:      She has no cervical adenopathy.  Neurological: She is alert. She has normal reflexes. No cranial nerve deficit. She exhibits normal muscle tone. Coordination normal.  Skin: Skin is warm and dry.  Psychiatric: She has a normal mood and affect. Her behavior is normal. Judgment and thought content normal.       Very combative personality  Bilateral cerumen impactions        Assessment & Plan:  Healthy female.  Hypertension.  Continue Tenoretic.  Hearing loss, secondary to ear wax used the earwax dissolving drops.  Return in two weeks for follow-up

## 2012-12-06 ENCOUNTER — Ambulatory Visit (INDEPENDENT_AMBULATORY_CARE_PROVIDER_SITE_OTHER): Payer: Medicare Other | Admitting: Family Medicine

## 2012-12-06 ENCOUNTER — Encounter: Payer: Self-pay | Admitting: Family Medicine

## 2012-12-06 VITALS — BP 160/90 | Temp 98.3°F | Ht 61.5 in | Wt 148.0 lb

## 2012-12-06 DIAGNOSIS — I10 Essential (primary) hypertension: Secondary | ICD-10-CM

## 2012-12-06 DIAGNOSIS — Z0000 Encounter for general adult medical examination without abnormal findings: Secondary | ICD-10-CM

## 2012-12-06 LAB — CBC WITH DIFFERENTIAL/PLATELET
Eosinophils Relative: 4 % (ref 0.0–5.0)
Monocytes Relative: 9.3 % (ref 3.0–12.0)
Neutrophils Relative %: 64.9 % (ref 43.0–77.0)
Platelets: 214 10*3/uL (ref 150.0–400.0)
RBC: 4.27 Mil/uL (ref 3.87–5.11)
WBC: 5.5 10*3/uL (ref 4.5–10.5)

## 2012-12-06 LAB — POCT URINALYSIS DIPSTICK
Bilirubin, UA: NEGATIVE
Glucose, UA: NEGATIVE
Ketones, UA: NEGATIVE

## 2012-12-06 LAB — BASIC METABOLIC PANEL
Chloride: 103 mEq/L (ref 96–112)
Creatinine, Ser: 1.2 mg/dL (ref 0.4–1.2)
Potassium: 4.9 mEq/L (ref 3.5–5.1)
Sodium: 140 mEq/L (ref 135–145)

## 2012-12-06 LAB — TSH: TSH: 0.94 u[IU]/mL (ref 0.35–5.50)

## 2012-12-06 MED ORDER — ATENOLOL-CHLORTHALIDONE 50-25 MG PO TABS: 1.0000 | ORAL_TABLET | Freq: Every day | ORAL | Status: DC

## 2012-12-06 NOTE — Progress Notes (Signed)
  Subjective:    Patient ID: Adriana Middleton, female    DOB: October 26, 1930, 79 y.o.   MRN: [id number]  HPI Adriana Middleton is a delightful 79 year old single female nonsmoker who comes in today accompanied by her daughter for a Medicare wellness examination  She takes Tenoretic one tablet daily for hypertension BP today 160/90. She states she didn't take her medication today. She also takes an aspirin tablet  Social history she,,,,,,,,,,,,,,, she lives with her daughter she is very sedentary she does not really leave the house.  Cognitive function normal she does not exercise on a regular basis, home health safety reviewed no issues identified, no guns in the house, she does not have a health care power of attorney and living will was encouraged to do that. She says her niece is an Pensions consultant and is in the process of doing that for her.   Review of Systems  Constitutional: Negative.   HENT: Negative.   Eyes: Negative.   Respiratory: Negative.   Cardiovascular: Negative.   Gastrointestinal: Negative.   Genitourinary: Negative.   Musculoskeletal: Negative.   Neurological: Negative.   Hematological: Negative.   Psychiatric/Behavioral: Negative.        Objective:   Physical Exam  Constitutional: She appears well-developed and well-nourished.  HENT:  Head: Normocephalic and atraumatic.  Right Ear: External ear normal.  Left Ear: External ear normal.  Nose: Nose normal.  Mouth/Throat: Oropharynx is clear and moist.  Eyes: EOM are normal. Pupils are equal, round, and reactive to light.  Neck: Normal range of motion. Neck supple. No thyromegaly present.  Cardiovascular: Normal rate, regular rhythm, normal heart sounds and intact distal pulses.  Exam reveals no gallop and no friction rub.   No murmur heard. Pulmonary/Chest: Effort normal and breath sounds normal.  Abdominal: Soft. Bowel sounds are normal. She exhibits no distension and no mass. There is no tenderness. There is no rebound and no  guarding.  Musculoskeletal: Normal range of motion.  Lymphadenopathy:    She has no cervical adenopathy.  Neurological: She is alert. She has normal reflexes. No cranial nerve deficit. She exhibits normal muscle tone. Coordination normal.  Skin: Skin is warm and dry.  Psychiatric: She has a normal mood and affect. Her behavior is normal. Judgment and thought content normal.          Assessment & Plan:  Healthy 79 year old female  Hypertension continue Tenoretic  Return in one year sooner if any problems  Health care power of attorney and living well

## 2012-12-06 NOTE — Patient Instructions (Signed)
Take your blood pressure medication daily  Have your family attorney draw up the health care power of attorney and living will  Labs today  Return in one year for general physical examination sooner if any problems

## 2012-12-31 ENCOUNTER — Other Ambulatory Visit: Payer: Self-pay | Admitting: Family Medicine

## 2014-03-12 ENCOUNTER — Other Ambulatory Visit: Payer: Self-pay | Admitting: Family Medicine

## 2014-12-30 ENCOUNTER — Other Ambulatory Visit: Payer: Self-pay | Admitting: Family Medicine

## 2015-02-18 DIAGNOSIS — H902 Conductive hearing loss, unspecified: Secondary | ICD-10-CM | POA: Diagnosis not present

## 2015-02-18 DIAGNOSIS — I10 Essential (primary) hypertension: Secondary | ICD-10-CM | POA: Diagnosis not present

## 2015-02-18 DIAGNOSIS — E780 Pure hypercholesterolemia: Secondary | ICD-10-CM | POA: Diagnosis not present

## 2015-02-28 DIAGNOSIS — H93299 Other abnormal auditory perceptions, unspecified ear: Secondary | ICD-10-CM | POA: Diagnosis not present

## 2015-02-28 DIAGNOSIS — H9313 Tinnitus, bilateral: Secondary | ICD-10-CM | POA: Diagnosis not present

## 2015-02-28 DIAGNOSIS — H905 Unspecified sensorineural hearing loss: Secondary | ICD-10-CM | POA: Diagnosis not present

## 2015-03-18 DIAGNOSIS — H9319 Tinnitus, unspecified ear: Secondary | ICD-10-CM | POA: Diagnosis not present

## 2015-03-18 DIAGNOSIS — H9041 Sensorineural hearing loss, unilateral, right ear, with unrestricted hearing on the contralateral side: Secondary | ICD-10-CM | POA: Diagnosis not present

## 2015-03-18 DIAGNOSIS — H905 Unspecified sensorineural hearing loss: Secondary | ICD-10-CM | POA: Diagnosis not present

## 2015-04-03 ENCOUNTER — Other Ambulatory Visit: Payer: Self-pay | Admitting: Family Medicine

## 2015-04-18 DIAGNOSIS — E780 Pure hypercholesterolemia: Secondary | ICD-10-CM | POA: Diagnosis not present

## 2015-04-18 DIAGNOSIS — I10 Essential (primary) hypertension: Secondary | ICD-10-CM | POA: Diagnosis not present

## 2015-04-18 DIAGNOSIS — H902 Conductive hearing loss, unspecified: Secondary | ICD-10-CM | POA: Diagnosis not present

## 2015-06-10 ENCOUNTER — Encounter (HOSPITAL_COMMUNITY): Payer: Self-pay | Admitting: Emergency Medicine

## 2015-06-10 ENCOUNTER — Emergency Department (HOSPITAL_COMMUNITY): Payer: Medicare Other

## 2015-06-10 ENCOUNTER — Inpatient Hospital Stay (HOSPITAL_COMMUNITY): Payer: Medicare Other

## 2015-06-10 ENCOUNTER — Inpatient Hospital Stay (HOSPITAL_COMMUNITY)
Admission: EM | Admit: 2015-06-10 | Discharge: 2015-06-10 | DRG: 066 | Discharge status: Against Medical Advice | Payer: Medicare Other | Attending: Emergency Medicine | Admitting: Emergency Medicine

## 2015-06-10 ENCOUNTER — Ambulatory Visit (INDEPENDENT_AMBULATORY_CARE_PROVIDER_SITE_OTHER): Payer: Self-pay | Admitting: Emergency Medicine

## 2015-06-10 VITALS — BP 180/110 | HR 110 | Temp 99.2°F | Resp 17

## 2015-06-10 DIAGNOSIS — R531 Weakness: Secondary | ICD-10-CM | POA: Diagnosis not present

## 2015-06-10 DIAGNOSIS — Z7982 Long term (current) use of aspirin: Secondary | ICD-10-CM

## 2015-06-10 DIAGNOSIS — R29810 Facial weakness: Secondary | ICD-10-CM | POA: Diagnosis not present

## 2015-06-10 DIAGNOSIS — J449 Chronic obstructive pulmonary disease, unspecified: Secondary | ICD-10-CM | POA: Diagnosis present

## 2015-06-10 DIAGNOSIS — Z87891 Personal history of nicotine dependence: Secondary | ICD-10-CM | POA: Diagnosis not present

## 2015-06-10 DIAGNOSIS — R4182 Altered mental status, unspecified: Secondary | ICD-10-CM

## 2015-06-10 DIAGNOSIS — Z79899 Other long term (current) drug therapy: Secondary | ICD-10-CM | POA: Diagnosis not present

## 2015-06-10 DIAGNOSIS — I638 Other cerebral infarction: Secondary | ICD-10-CM | POA: Diagnosis not present

## 2015-06-10 DIAGNOSIS — R29898 Other symptoms and signs involving the musculoskeletal system: Secondary | ICD-10-CM | POA: Diagnosis not present

## 2015-06-10 DIAGNOSIS — I639 Cerebral infarction, unspecified: Secondary | ICD-10-CM

## 2015-06-10 DIAGNOSIS — I6789 Other cerebrovascular disease: Secondary | ICD-10-CM | POA: Diagnosis not present

## 2015-06-10 DIAGNOSIS — I10 Essential (primary) hypertension: Secondary | ICD-10-CM | POA: Diagnosis not present

## 2015-06-10 DIAGNOSIS — G459 Transient cerebral ischemic attack, unspecified: Secondary | ICD-10-CM

## 2015-06-10 LAB — CBC WITH DIFFERENTIAL/PLATELET
BASOS PCT: 1 % (ref 0–1)
Basophils Absolute: 0 10*3/uL (ref 0.0–0.1)
EOS ABS: 0.1 10*3/uL (ref 0.0–0.7)
Eosinophils Relative: 2 % (ref 0–5)
HEMATOCRIT: 39.6 % (ref 36.0–46.0)
Hemoglobin: 13.3 g/dL (ref 12.0–15.0)
LYMPHS ABS: 1.1 10*3/uL (ref 0.7–4.0)
LYMPHS PCT: 22 % (ref 12–46)
MCH: 32.3 pg (ref 26.0–34.0)
MCHC: 33.6 g/dL (ref 30.0–36.0)
MCV: 96.1 fL (ref 78.0–100.0)
MONO ABS: 0.4 10*3/uL (ref 0.1–1.0)
Monocytes Relative: 8 % (ref 3–12)
NEUTROS ABS: 3.4 10*3/uL (ref 1.7–7.7)
Neutrophils Relative %: 67 % (ref 43–77)
PLATELETS: 157 10*3/uL (ref 150–400)
RBC: 4.12 MIL/uL (ref 3.87–5.11)
RDW: 12.8 % (ref 11.5–15.5)
WBC: 5 10*3/uL (ref 4.0–10.5)

## 2015-06-10 LAB — GLUCOSE, POCT (MANUAL RESULT ENTRY): POC Glucose: 98 mg/dl (ref 70–99)

## 2015-06-10 LAB — COMPREHENSIVE METABOLIC PANEL
ALBUMIN: 3.9 g/dL (ref 3.5–5.0)
ALT: 11 U/L — AB (ref 14–54)
AST: 24 U/L (ref 15–41)
Alkaline Phosphatase: 63 U/L (ref 38–126)
Anion gap: 9 (ref 5–15)
BILIRUBIN TOTAL: 0.6 mg/dL (ref 0.3–1.2)
BUN: 21 mg/dL — AB (ref 6–20)
CALCIUM: 11.3 mg/dL — AB (ref 8.9–10.3)
CO2: 26 mmol/L (ref 22–32)
Chloride: 101 mmol/L (ref 101–111)
Creatinine, Ser: 1.36 mg/dL — ABNORMAL HIGH (ref 0.44–1.00)
GFR calc Af Amer: 40 mL/min — ABNORMAL LOW (ref >60)
GFR, EST NON AFRICAN AMERICAN: 35 mL/min — AB (ref >60)
GLUCOSE: 87 mg/dL (ref 65–99)
Potassium: 3.5 mmol/L (ref 3.5–5.1)
Sodium: 136 mmol/L (ref 135–145)
TOTAL PROTEIN: 7.1 g/dL (ref 6.5–8.1)

## 2015-06-10 LAB — PROTIME-INR
INR: 1.12 (ref 0.00–1.49)
Prothrombin Time: 14.6 seconds (ref 11.6–15.2)

## 2015-06-10 LAB — I-STAT TROPONIN, ED: TROPONIN I, POC: 0.01 ng/mL (ref 0.00–0.08)

## 2015-06-10 MED ORDER — ASPIRIN EC 325 MG PO TBEC: 325.0000 mg | DELAYED_RELEASE_TABLET | Freq: Every day | ORAL | Status: DC

## 2015-06-10 MED ORDER — ASPIRIN 81 MG PO CHEW
324.0000 mg | CHEWABLE_TABLET | Freq: Once | ORAL | Status: AC
  Administered 2015-06-10: 324 mg via ORAL
  Filled 2015-06-10: qty 4

## 2015-06-10 NOTE — ED Notes (Signed)
To ED from [facility] for eval of left facial droop. Possibly started yesterday.

## 2015-06-10 NOTE — ED Provider Notes (Signed)
CSN: [id number]     Arrival date & time 06/10/15  1204 History   First MD Initiated Contact with Patient 06/10/15 1212     Chief Complaint  Patient presents with  . Facial Droop   (Consider location/radiation/quality/duration/timing/severity/associated sxs/prior Treatment) Patient is a 81 y.o. female presenting with neurologic complaint. The history is provided by the patient and a relative. No language interpreter was used.  Neurologic Problem This is a new problem. The current episode started yesterday. The problem occurs constantly. The problem has been unchanged. Associated symptoms include weakness. Pertinent negatives include no abdominal pain, change in bowel habit, chest pain, chills, congestion, coughing, diaphoresis, fatigue, headaches, myalgias, nausea, numbness, visual change or vomiting. Nothing aggravates the symptoms. She has tried nothing for the symptoms.    Past Medical History  Diagnosis Date  . DEPRESSION 07/08/2007  . CATARACTS, BILATERAL 04/18/2008  . HYPERTENSION 07/08/2007  . COPD 04/18/2008  . GERD 07/08/2007  . ARTHRITIS, LEFT HIP 04/18/2008  . 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 Colostomy      partial   Family History  Problem Relation Age of Onset  . Diabetes Other   . Hypertension Other   . Cancer Other     prostate  . Heart disease Other    History  Substance Use Topics  . Smoking status: Former Games developer  . Smokeless tobacco: Never Used     Comment: quit 15 yrs ago.    . Alcohol Use: Yes   OB History    No data available     Review of Systems  Constitutional: Negative for chills, diaphoresis and fatigue.  HENT: Negative for congestion.   Respiratory: Negative for cough, chest tightness and shortness of breath.   Cardiovascular: Negative for chest pain.  Gastrointestinal: Negative for nausea, vomiting, abdominal pain and change in bowel habit.  Musculoskeletal: Negative for  myalgias and gait problem.  Neurological: Positive for facial asymmetry and weakness. Negative for speech difficulty, light-headedness, numbness and headaches.  Psychiatric/Behavioral: Negative for confusion.  All other systems reviewed and are negative.     Allergies  Codeine and Simvastatin  Home Medications   Prior to Admission medications   Medication Sig Start Date End Date Taking? Authorizing Provider  aspirin 81 MG tablet Take 160 mg by mouth daily.      Historical Provider, MD  atenolol-chlorthalidone (TENORETIC) 50-25 MG per tablet Take 1 tablet by mouth daily. 12/06/12   Roderick Pee, MD  atenolol-chlorthalidone (TENORETIC) 50-25 MG per tablet TAKE 1 TABLET BY MOUTH DAILY. 12/31/14   Roderick Pee, MD  vitamin B-12 (CYANOCOBALAMIN) 250 MCG tablet Take 250 mcg by mouth daily.      Historical Provider, MD   BP 170/83 mmHg  Pulse 63  Temp(Src) 99.3 F (37.4 C) (Oral)  Resp 18  SpO2 99%   Filed Vitals:   06/10/15 1235 06/10/15 1300 06/10/15 1315 06/10/15 1330  BP: 170/83 163/65 166/58 162/98  Pulse:  57 59 59  Temp:      TempSrc:      Resp: 18 22 20 18   SpO2:  94% 98% 100%   Physical Exam  Constitutional: She is oriented to person, place, and time. She appears well-developed and well-nourished. No distress.  HENT:  Head: Normocephalic and atraumatic.  Nose: Nose normal.  Mouth/Throat: Oropharynx is clear and moist. No oropharyngeal exudate.  Eyes: EOM are normal. Pupils are equal, round, and reactive to light.  Neck:  Normal range of motion. Neck supple.  Cardiovascular: Normal rate, regular rhythm, normal heart sounds and intact distal pulses.   No murmur heard. Pulmonary/Chest: Effort normal and breath sounds normal. No respiratory distress. She has no wheezes. She exhibits no tenderness.  Abdominal: Soft. There is no tenderness. There is no rebound and no guarding.  Musculoskeletal: Normal range of motion. She exhibits no tenderness.  Lymphadenopathy:    She  has no cervical adenopathy.  Neurological: She is alert and oriented to person, place, and time. No cranial nerve deficit. Coordination normal.  Alert and oriented, clear and fluent speech, mild left facial droop with nasolabial flattening on left.  Moving all extremities.  4/5 left grip strength and left ankle flexion/exteion, vs 5/5 in other extremities.  Sensation throughout   Skin: Skin is warm and dry. She is not diaphoretic.  Psychiatric: She has a normal mood and affect. Her behavior is normal. Judgment and thought content normal.  Nursing note and vitals reviewed.   ED Course  Procedures (including critical care time) Labs Review Labs Reviewed  COMPREHENSIVE METABOLIC PANEL - Abnormal; Notable for the following:    BUN 21 (*)    Creatinine, Ser 1.36 (*)    Calcium 11.3 (*)    ALT 11 (*)    GFR calc non Af Amer 35 (*)    GFR calc Af Amer 40 (*)    All other components within normal limits  CBC WITH DIFFERENTIAL/PLATELET  PROTIME-INR  I-STAT TROPOININ, ED    Imaging Review Ct Head Wo Contrast  06/10/2015   CLINICAL DATA:  Left-sided weakness.  EXAM: CT HEAD WITHOUT CONTRAST  TECHNIQUE: Contiguous axial images were obtained from the base of the skull through the vertex without intravenous contrast.  COMPARISON:  None.  FINDINGS: There is no evidence of mass effect, midline shift, or extra-axial fluid collections. There is no evidence of a space-occupying lesion or intracranial hemorrhage. There is no evidence of a cortical-based area of acute infarction. There is generalized cerebral atrophy. There is periventricular white matter low attenuation likely secondary to microangiopathy.  The ventricles and sulci are appropriate for the patient's age. The basal cisterns are patent.  Visualized portions of the orbits are unremarkable. There is a small left ethmoid sinus osteoma. Cerebrovascular atherosclerotic calcifications are noted.  The osseous structures are unremarkable.  IMPRESSION:  1. No acute intracranial pathology. 2. Chronic microvascular disease and cerebral atrophy.   Electronically Signed   By: Elige Ko   On: 06/10/2015 14:38     EKG Interpretation None      MDM   Final diagnoses:  Facial droop  Left-sided weakness   Pt is a 81 yo F with hx of MVP, HTN, COPD, and chronic back pain who presented with family concerned for possible facial droop.  Reports that people thought her "face was twisted up" at church yesterday morning.  She was still able to ambulate normally, denies numbness, weakness, speech deficits, vision changes, or confusion.  Her daughter saw her this morning and was concerned for persistent left facial droop, so called EMS.   Pt arrived alert and oriented, clear and fluent speech, mild left facial droop.  Moving all extremities.  4/5 left grip strength and left ankle flexion vs 5/5 in other extremities.   Last normal was over 24 hours ago, so well out of the window for any intervention.  Will work up for possible CVA with CT head.  Labs sent CT head showed chronic microvascular disease but no  acute pathology.  Given ASA 324.    Hosptialist and neuro teams paged.   1530: Hospitalist presented to bedside to discuss admission when patient voiced disagreement with admission now.  Previously was ok with getting admitted for further work up but now requested dc home.  Will obtain an MRI brain to help evaluate further.  Pt agreeable to this.   Plan to obtain MRI brain.  Ok to [facility] if normal vs strongly encourage admission if abnormal, but she is alert and oriented and able to make her own medical decisions and ok to sign out AMA if she requests.   Please see Dr. Arlington CalixPickering's notes for final disposition of the patient after MRI.     If performed, labs, EKGs, and imaging were reviewed and interpreted by myself and my attending, and incorporated in the medical decision making.  Patient was seen with ED Attending, Dr. Suzzette RighterGentry  Kayston Jodoin,  MD  Lenell AntuJamie Koron Godeaux, MD 06/10/15 2313  Mirian MoMatthew Gentry, MD 06/14/15 [phone]

## 2015-06-10 NOTE — ED Provider Notes (Signed)
  Physical Exam  BP 157/102 mmHg  Pulse 61  Temp(Src) 99.3 F (37.4 C) (Oral)  Resp 18  SpO2 99%  Physical Exam  ED Course  Procedures  MDM Patient's MRI shows a stroke. She is still unwilling to stay. She is aware the risks including death or serious disability. Will discharge home and start on full dose aspirin.      Benjiman CoreNathan Keionte Swicegood, MD 06/10/15 [phone]

## 2015-06-10 NOTE — ED Notes (Signed)
Pt returned from CT scan.

## 2015-06-10 NOTE — Progress Notes (Signed)
   Subjective:  This chart was scribed for Earl LitesSteve Brianni Manthe, MD by [facility] Abu Hashem, [facility] sc[facility].The patient was seen in exam room 07 and the patient's care was started at 11:22 AM.   Patient ID: Adriana Middleton, female    DOB: 1930-01-25, 81 y.o.   MRN: [id number] Chief Complaint  Patient presents with  . Altered Mental Status    disoriented   . Facial Droop    L side   HPI HPI Comments: Adriana Kovelyn E Graver is a 81 y.o. female who presents to [facility] for left sided facial drooping, altered mental status and left sided weakness. Members of her church first noticed the facial drooping yesterday at church but she refused to go to the hospital. Her daughter says she is disoriented, she has left the stove on. Pt is able to ambulate on her own. She quit smoking 30 years ago. She denies abdominal pain, chest pain and shortness of breath   Review of Systems  Respiratory: Negative for shortness of breath.   Cardiovascular: Negative for chest pain.  Gastrointestinal: Negative for abdominal pain.  Neurological: Positive for facial asymmetry and weakness.  Psychiatric/Behavioral: Positive for confusion.       Objective:  BP 180/110 mmHg  Pulse 110  Temp(Src) 99.2 F (37.3 C) (Oral)  Resp 17  SpO2 83% Physical Exam  Vitals reviewed. CONSTITUTIONAL: Well developed/well nourished, alert and cooperative, answers questions  Oriented time and person but not place. Recognizes her daughter and knows her birthday. HEAD: Normocephalic/atraumatic EYES: EOMI/PERRL ENMT: Mucous membranes moist NECK: supple no meningeal signs SPINE/BACK:entire spine nontender CV: S1/S2 noted, no murmurs/rubs/gallops noted, no carotid bruit LUNGS: Lungs are clear to auscultation bilaterally, no apparent distress ABDOMEN: soft, nontender, no rebound or guarding, bowel sounds noted throughout abdomen GU:no cva tenderness NEURO: Pt is awake/alert/appropriate, moves all  extremitiesx4.  Minimal amount of weakness of the left upper lip. Appears to have decreased left grip strength and left leg weakness. Reflexes are 2+ and toes are down going. EXTREMITIES: pulses normal/equal, full ROM SKIN: warm, color normal PSYCH: no abnormalities of mood noted, alert and oriented to situation 11:25 AM- 2L oxygen placed    Assessment & Plan:   Patient presents with a greater than 24 hour history of facial droop and altered mental status. She is sent to the hospital or further evaluation. Note she has a low grade temp of 99.2 so infection can be a trigger for these symptoms, need to rule out a CVA. She was initially hypoxic on arrival O2 started immediately went to 98 on 2 L this was decreased to 1 L.

## 2015-06-10 NOTE — ED Notes (Signed)
Pt returned from MRI,

## 2015-06-10 NOTE — Consult Note (Signed)
this 81 year old African-American femaleH/ohSBO, HTN, diverticulosis, status post back surgery [facility] being [facility] this morning. She was at her church on 7/10 and it was noticed by the choir that patient was not looking like "her normal self" At baseline highly functional able to cook clean and do all of her ADLs and still drives. She lives with her daughter and has done so for the past 5 years. Her husband died about 12 years ago . She states that she "wasn't feeling any different than previously" according to notes in Epic from Dr. Cleta Albertsaub, daughter said she was disoriented left the stove on.  It was noted thatUrgent medical that pulse is 110 blood pressure was elevated and her sats were 83% with mild temperature of 99.2 in the emergency department she wastriaged appropriately and CT scan of the head showed chronic microvascular disease and atrophy  She is somewhat hungry and states that she wants to go home She is amenable to discussion but really feels like there is no problem going on with her and that she is at her baseline labs reveal a mild drop in her GFR from 45-50  WBC is 5 CT scan head as above  alert pleasant oriented no apparent distress No facial asymmetry at present buccinator is within normal limits, masseter within normal limits Sternocleidomastoid normal limits Shoulder shrug power full and strong Biceps triceps strength 5/5 Finger-nose-finger test normal Straight leg raise test and knee to chest normal on the right somewhat diminished on the left secondary to back issues Sensory is grossly intact S1-S2 no murmur rub or gallop, no JVD, no bruit, no abdominal pain no tenderness  patient passed a swallow eval and tells me that she wants to go home I explained to her for  15 minutes with use of the white board in the room that the highest risk for CVAs within the 24-48 hours outside of a TIA which I think she has had She's not having  any active chest pain and is looking fair on telemetry I do not think an EKG or troponin is warranted I do however think that she at least needs an MRI and should be admitted for full stroke workup inclusive of carotid Doppler, echocardiogram, lipid panel, A1c, and should be evaluated by therapy services  The emergency room physician is to let me know what the patient decides-I've left in the patient's hands that she is completely competent and able to make a decision for herself She understands that [facility] and [facility] will in no way be liable if something were to happen to her one she leans the confines of the compound  Pleas KochJai Zurisadai Helminiak, MD [facility] [phone]

## 2015-06-10 NOTE — ED Notes (Signed)
Patient transported to MRI 

## 2015-06-10 NOTE — Discharge Instructions (Signed)
You have had a stroke in her leaving against advice. Feel free to return for follow-up as soon as possible with your primary care doctor and neurology. Start taking a full dose aspirin once a day.

## 2015-06-12 ENCOUNTER — Telehealth: Payer: Self-pay | Admitting: *Deleted

## 2015-06-12 NOTE — Telephone Encounter (Signed)
Amy of [employer] called to confirm pt was not admitted to floor. Amy states she will work up so OBS billing can be completed.

## 2015-06-13 DIAGNOSIS — H902 Conductive hearing loss, unspecified: Secondary | ICD-10-CM | POA: Diagnosis not present

## 2015-06-13 DIAGNOSIS — I639 Cerebral infarction, unspecified: Secondary | ICD-10-CM | POA: Diagnosis not present

## 2015-06-13 DIAGNOSIS — I10 Essential (primary) hypertension: Secondary | ICD-10-CM | POA: Diagnosis not present

## 2015-06-16 ENCOUNTER — Inpatient Hospital Stay (HOSPITAL_COMMUNITY): Payer: Medicare Other

## 2015-06-16 ENCOUNTER — Inpatient Hospital Stay (HOSPITAL_COMMUNITY)
Admission: EM | Admit: 2015-06-16 | Discharge: 2015-06-19 | DRG: 065 | Disposition: A | Discharge status: Skilled Nursing Facility | Payer: Medicare Other | Attending: Internal Medicine | Admitting: Internal Medicine

## 2015-06-16 ENCOUNTER — Encounter (HOSPITAL_COMMUNITY): Payer: Self-pay | Admitting: Emergency Medicine

## 2015-06-16 DIAGNOSIS — R262 Difficulty in walking, not elsewhere classified: Secondary | ICD-10-CM | POA: Diagnosis not present

## 2015-06-16 DIAGNOSIS — N183 Chronic kidney disease, stage 3 (moderate): Secondary | ICD-10-CM | POA: Diagnosis not present

## 2015-06-16 DIAGNOSIS — I700 Atherosclerosis of aorta: Secondary | ICD-10-CM | POA: Insufficient documentation

## 2015-06-16 DIAGNOSIS — I10 Essential (primary) hypertension: Secondary | ICD-10-CM | POA: Diagnosis present

## 2015-06-16 DIAGNOSIS — J449 Chronic obstructive pulmonary disease, unspecified: Secondary | ICD-10-CM | POA: Diagnosis not present

## 2015-06-16 DIAGNOSIS — M6281 Muscle weakness (generalized): Secondary | ICD-10-CM | POA: Diagnosis not present

## 2015-06-16 DIAGNOSIS — G8194 Hemiplegia, unspecified affecting left nondominant side: Secondary | ICD-10-CM | POA: Diagnosis not present

## 2015-06-16 DIAGNOSIS — R001 Bradycardia, unspecified: Secondary | ICD-10-CM | POA: Diagnosis present

## 2015-06-16 DIAGNOSIS — S0990XA Unspecified injury of head, initial encounter: Secondary | ICD-10-CM | POA: Diagnosis not present

## 2015-06-16 DIAGNOSIS — I351 Nonrheumatic aortic (valve) insufficiency: Secondary | ICD-10-CM | POA: Diagnosis not present

## 2015-06-16 DIAGNOSIS — I6601 Occlusion and stenosis of right middle cerebral artery: Secondary | ICD-10-CM | POA: Diagnosis not present

## 2015-06-16 DIAGNOSIS — I63411 Cerebral infarction due to embolism of right middle cerebral artery: Principal | ICD-10-CM | POA: Diagnosis present

## 2015-06-16 DIAGNOSIS — R4189 Other symptoms and signs involving cognitive functions and awareness: Secondary | ICD-10-CM | POA: Diagnosis not present

## 2015-06-16 DIAGNOSIS — Q211 Atrial septal defect: Secondary | ICD-10-CM

## 2015-06-16 DIAGNOSIS — I1310 Hypertensive heart and chronic kidney disease without heart failure, with stage 1 through stage 4 chronic kidney disease, or unspecified chronic kidney disease: Secondary | ICD-10-CM | POA: Diagnosis present

## 2015-06-16 DIAGNOSIS — R404 Transient alteration of awareness: Secondary | ICD-10-CM | POA: Diagnosis not present

## 2015-06-16 DIAGNOSIS — E785 Hyperlipidemia, unspecified: Secondary | ICD-10-CM | POA: Diagnosis present

## 2015-06-16 DIAGNOSIS — R4781 Slurred speech: Secondary | ICD-10-CM | POA: Diagnosis not present

## 2015-06-16 DIAGNOSIS — I63511 Cerebral infarction due to unspecified occlusion or stenosis of right middle cerebral artery: Secondary | ICD-10-CM | POA: Diagnosis not present

## 2015-06-16 DIAGNOSIS — F329 Major depressive disorder, single episode, unspecified: Secondary | ICD-10-CM | POA: Diagnosis not present

## 2015-06-16 DIAGNOSIS — I341 Nonrheumatic mitral (valve) prolapse: Secondary | ICD-10-CM | POA: Diagnosis not present

## 2015-06-16 DIAGNOSIS — Z87891 Personal history of nicotine dependence: Secondary | ICD-10-CM | POA: Diagnosis not present

## 2015-06-16 DIAGNOSIS — I6789 Other cerebrovascular disease: Secondary | ICD-10-CM | POA: Diagnosis not present

## 2015-06-16 DIAGNOSIS — R531 Weakness: Secondary | ICD-10-CM | POA: Diagnosis not present

## 2015-06-16 DIAGNOSIS — R29810 Facial weakness: Secondary | ICD-10-CM | POA: Diagnosis not present

## 2015-06-16 DIAGNOSIS — I639 Cerebral infarction, unspecified: Secondary | ICD-10-CM | POA: Diagnosis not present

## 2015-06-16 DIAGNOSIS — Z7982 Long term (current) use of aspirin: Secondary | ICD-10-CM

## 2015-06-16 DIAGNOSIS — I63239 Cerebral infarction due to unspecified occlusion or stenosis of unspecified carotid arteries: Secondary | ICD-10-CM | POA: Diagnosis not present

## 2015-06-16 DIAGNOSIS — I6523 Occlusion and stenosis of bilateral carotid arteries: Secondary | ICD-10-CM | POA: Diagnosis not present

## 2015-06-16 DIAGNOSIS — I253 Aneurysm of heart: Secondary | ICD-10-CM | POA: Diagnosis not present

## 2015-06-16 DIAGNOSIS — R414 Neurologic neglect syndrome: Secondary | ICD-10-CM | POA: Diagnosis present

## 2015-06-16 DIAGNOSIS — I6529 Occlusion and stenosis of unspecified carotid artery: Secondary | ICD-10-CM

## 2015-06-16 DIAGNOSIS — Z8673 Personal history of transient ischemic attack (TIA), and cerebral infarction without residual deficits: Secondary | ICD-10-CM | POA: Diagnosis not present

## 2015-06-16 DIAGNOSIS — I69392 Facial weakness following cerebral infarction: Secondary | ICD-10-CM | POA: Diagnosis not present

## 2015-06-16 DIAGNOSIS — I4891 Unspecified atrial fibrillation: Secondary | ICD-10-CM | POA: Diagnosis not present

## 2015-06-16 DIAGNOSIS — M6289 Other specified disorders of muscle: Secondary | ICD-10-CM | POA: Diagnosis not present

## 2015-06-16 LAB — CBC WITH DIFFERENTIAL/PLATELET
BASOS ABS: 0 10*3/uL (ref 0.0–0.1)
Basophils Relative: 0 % (ref 0–1)
Eosinophils Absolute: 0.1 10*3/uL (ref 0.0–0.7)
Eosinophils Relative: 2 % (ref 0–5)
HCT: 36.9 % (ref 36.0–46.0)
Hemoglobin: 12.9 g/dL (ref 12.0–15.0)
Lymphocytes Relative: 15 % (ref 12–46)
Lymphs Abs: 1 10*3/uL (ref 0.7–4.0)
MCH: 32.2 pg (ref 26.0–34.0)
MCHC: 35 g/dL (ref 30.0–36.0)
MCV: 92 fL (ref 78.0–100.0)
Monocytes Absolute: 1 10*3/uL (ref 0.1–1.0)
Monocytes Relative: 15 % — ABNORMAL HIGH (ref 3–12)
NEUTROS ABS: 4.4 10*3/uL (ref 1.7–7.7)
Neutrophils Relative %: 68 % (ref 43–77)
PLATELETS: 195 10*3/uL (ref 150–400)
RBC: 4.01 MIL/uL (ref 3.87–5.11)
RDW: 12.3 % (ref 11.5–15.5)
WBC: 6.5 10*3/uL (ref 4.0–10.5)

## 2015-06-16 LAB — URINALYSIS, ROUTINE W REFLEX MICROSCOPIC
BILIRUBIN URINE: NEGATIVE
GLUCOSE, UA: NEGATIVE mg/dL
Hgb urine dipstick: NEGATIVE
Ketones, ur: NEGATIVE mg/dL
Nitrite: NEGATIVE
PH: 5 (ref 5.0–8.0)
Protein, ur: NEGATIVE mg/dL
SPECIFIC GRAVITY, URINE: 1.007 (ref 1.005–1.030)
Urobilinogen, UA: 1 mg/dL (ref 0.0–1.0)

## 2015-06-16 LAB — BASIC METABOLIC PANEL
ANION GAP: 7 (ref 5–15)
BUN: 21 mg/dL — ABNORMAL HIGH (ref 6–20)
CHLORIDE: 93 mmol/L — AB (ref 101–111)
CO2: 31 mmol/L (ref 22–32)
Calcium: 11.5 mg/dL — ABNORMAL HIGH (ref 8.9–10.3)
Creatinine, Ser: 1.45 mg/dL — ABNORMAL HIGH (ref 0.44–1.00)
GFR, EST AFRICAN AMERICAN: 37 mL/min — AB (ref >60)
GFR, EST NON AFRICAN AMERICAN: 32 mL/min — AB (ref >60)
Glucose, Bld: 118 mg/dL — ABNORMAL HIGH (ref 65–99)
POTASSIUM: 3.2 mmol/L — AB (ref 3.5–5.1)
Sodium: 131 mmol/L — ABNORMAL LOW (ref 135–145)

## 2015-06-16 LAB — URINE MICROSCOPIC-ADD ON

## 2015-06-16 LAB — I-STAT TROPONIN, ED: Troponin i, poc: 0 ng/mL (ref 0.00–0.08)

## 2015-06-16 MED ORDER — ATENOLOL-CHLORTHALIDONE 50-25 MG PO TABS: 1.0000 | ORAL_TABLET | Freq: Every day | ORAL | Status: DC

## 2015-06-16 MED ORDER — ASPIRIN EC 81 MG PO TBEC
162.0000 mg | DELAYED_RELEASE_TABLET | Freq: Every day | ORAL | Status: DC
  Administered 2015-06-17: 162 mg via ORAL
  Filled 2015-06-16: qty 2

## 2015-06-16 MED ORDER — HEPARIN SODIUM (PORCINE) 5000 UNIT/ML IJ SOLN
5000.0000 [IU] | Freq: Three times a day (TID) | INTRAMUSCULAR | Status: DC
  Administered 2015-06-17 – 2015-06-18 (×6): 5000 [IU] via SUBCUTANEOUS
  Filled 2015-06-16 (×6): qty 1

## 2015-06-16 MED ORDER — ATORVASTATIN CALCIUM 10 MG PO TABS
10.0000 mg | ORAL_TABLET | Freq: Every day | ORAL | Status: DC
  Administered 2015-06-17: 10 mg via ORAL
  Filled 2015-06-16: qty 1

## 2015-06-16 MED ORDER — ATENOLOL 25 MG PO TABS
50.0000 mg | ORAL_TABLET | Freq: Every day | ORAL | Status: DC
  Administered 2015-06-17 – 2015-06-19 (×3): 50 mg via ORAL
  Filled 2015-06-16 (×3): qty 2

## 2015-06-16 MED ORDER — STROKE: EARLY STAGES OF RECOVERY BOOK
Freq: Once | Status: AC
  Administered 2015-06-17: 02:00:00
  Filled 2015-06-16: qty 1

## 2015-06-16 MED ORDER — CHLORTHALIDONE 25 MG PO TABS
25.0000 mg | ORAL_TABLET | Freq: Every day | ORAL | Status: DC
  Administered 2015-06-17 – 2015-06-19 (×3): 25 mg via ORAL
  Filled 2015-06-16 (×3): qty 1

## 2015-06-16 MED ORDER — IBUPROFEN 200 MG PO TABS: 400.0000 mg | ORAL_TABLET | Freq: Every day | ORAL | Status: DC | PRN

## 2015-06-16 NOTE — ED Notes (Signed)
Provider at the bedside.  

## 2015-06-16 NOTE — ED Notes (Signed)
Attempted report.  States call us back in about ten minutes.

## 2015-06-16 NOTE — H&P (Signed)
[facility] History and Physical  Adriana Middleton BJY:[id number] DOB: [birth year] DOA: 06/16/2015  Referring physician: EDP PCP: No primary care provider on file.   Chief Complaint: Lef sided Weakness   HPI: Adriana Middleton is a 81 y.o. female diagnosed with acute multifocal R MCA territory ischemic stroke on 06/10/15 when she presented to the ED at that time.  Attempt was made by EDP and our service to admit the patient; however, patient elected to leave AMA instead.  Since leaving the hospital she has had ongoing difficulty with left sided weakness, slurred-speech, memory difficulty, and fall yesterday.  Symptoms havent worsened since that time but havent improved any either.  Review of Systems: Systems reviewed.  As above, otherwise negative  Past Medical History  Diagnosis Date  . DEPRESSION 07/08/2007  . CATARACTS, BILATERAL 04/18/2008  . HYPERTENSION 07/08/2007  . COPD 04/18/2008  . GERD 07/08/2007  . ARTHRITIS, LEFT HIP 04/18/2008  . 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 Colostomy      partial   Social History:  reports that she has quit smoking. She has never used smokeless tobacco. She reports that she drinks alcohol. Her drug history is not on file.  Allergies  Allergen Reactions  . Codeine Itching  . Simvastatin Itching    Family History  Problem Relation Age of Onset  . Diabetes Other   . Hypertension Other   . Cancer Other     prostate  . Heart disease Other      Prior to Admission medications   Medication Sig Start Date End Date Taking? Authorizing Provider  aspirin EC 81 MG tablet Take 162 mg by mouth daily.   Yes Historical Provider, MD  atenolol-chlorthalidone (TENORETIC) 50-25 MG per tablet TAKE 1 TABLET BY MOUTH DAILY. 12/31/14  Yes Roderick Pee, MD  atorvastatin (LIPITOR) 10 MG tablet Take 10 mg by mouth daily.   Yes Historical Provider, MD  ibuprofen (ADVIL,MOTRIN) 200 MG tablet Take  400 mg by mouth daily as needed (pain).   Yes Historical Provider, MD   Physical Exam: Filed Vitals:   06/16/15 2104  BP: 147/112  Pulse: 58  Temp: 98.5 F (36.9 C)  Resp: 16    BP 147/112 mmHg  Pulse 58  Temp(Src) 98.5 F (36.9 C) (Oral)  Resp 16  Ht  (1.575 m)  Wt 61.689 kg (136 lb)  BMI 24.87 kg/m2  SpO2 100%  General Appearance:    Alert, oriented, no distress, appears stated age  Head:    Normocephalic, atraumatic  Eyes:    PERRL, EOMI, sclera non-icteric        Nose:   Nares without drainage or epistaxis. Mucosa, turbinates normal  Throat:   Moist mucous membranes. Oropharynx without erythema or exudate.  Neck:   Supple. No carotid bruits.  No thyromegaly.  No lymphadenopathy.   Back:     No CVA tenderness, no spinal tenderness  Lungs:     Clear to auscultation bilaterally, without wheezes, rhonchi or rales  Chest wall:    No tenderness to palpitation  Heart:    Regular rate and rhythm without murmurs, gallops, rubs  Abdomen:     Soft, non-tender, nondistended, normal bowel sounds, no organomegaly  Genitalia:    deferred  Rectal:    deferred  Extremities:   No clubbing, cyanosis or edema.  Pulses:   2+ and symmetric all extremities  Skin:   Skin color, texture,  turgor normal, no rashes or lesions  Lymph nodes:   Cervical, supraclavicular, and axillary nodes normal  Neurologic:   CNII-XII intact. Normal strength, sensation and reflexes      throughout    Labs on Admission:  Basic Metabolic Panel:  Recent Labs Lab 06/10/15 1352 06/16/15 2130  NA 136 131*  K 3.5 3.2*  CL 101 93*  CO2 26 31  GLUCOSE 87 118*  BUN 21* 21*  CREATININE 1.36* 1.45*  CALCIUM 11.3* 11.5*   Liver Function Tests:  Recent Labs Lab 06/10/15 1352  AST 24  ALT 11*  ALKPHOS 63  BILITOT 0.6  PROT 7.1  ALBUMIN 3.9   No results for input(s): LIPASE, AMYLASE in the last 168 hours. No results for input(s): AMMONIA in the last 168 hours. CBC:  Recent Labs Lab  06/10/15 1352 06/16/15 2130  WBC 5.0 6.5  NEUTROABS 3.4 4.4  HGB 13.3 12.9  HCT 39.6 36.9  MCV 96.1 92.0  PLT 157 195   Cardiac Enzymes: No results for input(s): CKTOTAL, CKMB, CKMBINDEX, TROPONINI in the last 168 hours.  BNP (last 3 results) No results for input(s): PROBNP in the last 8760 hours. CBG: No results for input(s): GLUCAP in the last 168 hours.  Radiological Exams on Admission: No results found.  EKG: Independently reviewed.  Assessment/Plan Principal Problem:   Acute right arterial ischemic stroke, MCA (middle cerebral artery) Active Problems:   Essential hypertension   Left-sided weakness   1. Acute R MCA ischemic stroke - Now subacute 1. Stroke pathway 2. Repeat MRI to see if there is any extension of stroke area 3. MRA ordered 4. Neurology consulted and going to see patient 5. PT/OT/SLP 6. NPO until she passes bedside swallow at least (or formal SLP swallow if needed). 2. HTN - continue home meds    Code Status: Full Code  Family Communication: Daughter at bedside Disposition Plan: Admit to inpatient   Time spent: 70 min  GARDNER, JARED M. [facility] Pager [phone]  If 7AM-7PM, please contact the day team taking care of the patient [email or web address] Password [email or web address] 06/16/2015, 10:20 PM

## 2015-06-16 NOTE — ED Provider Notes (Signed)
CSN: [id number]     Arrival date & time 06/16/15  2052 History   First MD Initiated Contact with Patient 06/16/15 2058     Chief Complaint  Patient presents with  . Weakness     (Consider location/radiation/quality/duration/timing/severity/associated sxs/prior Treatment) HPI Comments: Patient presents emergency department with chief complaint of generalized weakness. Patient states that she had a stroke 1 week ago. She left the hospital AMA. She has a outpatient neurology appointment scheduled for tomorrow. She states that she cannot tolerate the weakness anymore and needs something to be done. She complains of left-sided weakness, some slurred speech, and memory difficulty. She has also had difficulty with chewing and swallowing. There have been no improvements or worsening of her symptoms. She states that she lost her balance yesterday and felt to the ground. She denies any pain. She normally walks with a walker. Additionally, she states that she has been urinating on herself, which is new. She is accompanied by her daughter, who states that the patient is now willing to be admitted showed the need arise.  The history is provided by the patient. No language interpreter was used.    Past Medical History  Diagnosis Date  . DEPRESSION 07/08/2007  . CATARACTS, BILATERAL 04/18/2008  . HYPERTENSION 07/08/2007  . COPD 04/18/2008  . GERD 07/08/2007  . ARTHRITIS, LEFT HIP 04/18/2008  . 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 Colostomy      partial   Family History  Problem Relation Age of Onset  . Diabetes Other   . Hypertension Other   . Cancer Other     prostate  . Heart disease Other    History  Substance Use Topics  . Smoking status: Former Games developer  . Smokeless tobacco: Never Used     Comment: quit 15 yrs ago.    . Alcohol Use: Yes   OB History    No data available     Review of Systems  Constitutional: Negative  for fever and chills.  Respiratory: Negative for shortness of breath.   Cardiovascular: Negative for chest pain.  Gastrointestinal: Negative for nausea, vomiting, diarrhea and constipation.  Genitourinary: Negative for dysuria.  Neurological: Positive for speech difficulty and weakness.  All other systems reviewed and are negative.     Allergies  Codeine and Simvastatin  Home Medications   Prior to Admission medications   Medication Sig Start Date End Date Taking? Authorizing Provider  aspirin EC 325 MG tablet Take 1 tablet (325 mg total) by mouth daily. 06/10/15   Benjiman Core, MD  atenolol-chlorthalidone (TENORETIC) 50-25 MG per tablet Take 1 tablet by mouth daily. 12/06/12   Roderick Pee, MD  atenolol-chlorthalidone (TENORETIC) 50-25 MG per tablet TAKE 1 TABLET BY MOUTH DAILY. Patient not taking: Reported on 06/10/2015 12/31/14   Roderick Pee, MD  atorvastatin (LIPITOR) 10 MG tablet Take 10 mg by mouth daily.    Historical Provider, MD  vitamin B-12 (CYANOCOBALAMIN) 250 MCG tablet Take 250 mcg by mouth daily.      Historical Provider, MD   BP 147/112 mmHg  Pulse 58  Temp(Src) 98.5 F (36.9 C) (Oral)  Resp 16  Ht 5\' 2"  (1.575 m)  Wt 136 lb (61.689 kg)  BMI 24.87 kg/m2  SpO2 100% Physical Exam  Constitutional: She is oriented to person, place, and time. She appears well-developed and well-nourished.  HENT:  Head: Normocephalic and atraumatic.  Eyes: Conjunctivae and EOM are normal.  Pupils are equal, round, and reactive to light.  Neck: Normal range of motion. Neck supple.  Cardiovascular: Normal rate and regular rhythm.  Exam reveals no gallop and no friction rub.   No murmur heard. Pulmonary/Chest: Effort normal and breath sounds normal. No respiratory distress. She has no wheezes. She has no rales. She exhibits no tenderness.  Abdominal: Soft. Bowel sounds are normal. She exhibits no distension and no mass. There is no tenderness. There is no rebound and no guarding.   Musculoskeletal: Normal range of motion. She exhibits no edema or tenderness.  Neurological: She is alert and oriented to person, place, and time.  4 out of 5 strength of left upper and lower extremities, 5 out of 5 strength of right upper and lower extremities  Normal finger to nose, CN III-12 grossly intact, mild loss of left-sided nasolabial fold, mildly asymmetric smile  Speech is clear and nonpressured  Skin: Skin is warm and dry.  Psychiatric: She has a normal mood and affect. Her behavior is normal. Judgment and thought content normal.  Nursing note and vitals reviewed.   ED Course  Procedures (including critical care time) Results for orders placed or performed during the hospital encounter of 06/16/15  CBC with Differential/Platelet  Result Value Ref Range   WBC 6.5 4.0 - 10.5 K/uL   RBC 4.01 3.87 - 5.11 MIL/uL   Hemoglobin 12.9 12.0 - 15.0 g/dL   HCT 16.1 09.6 - 04.5 %   MCV 92.0 78.0 - 100.0 fL   MCH 32.2 26.0 - 34.0 pg   MCHC 35.0 30.0 - 36.0 g/dL   RDW 40.9 81.1 - 91.4 %   Platelets 195 150 - 400 K/uL   Neutrophils Relative % 68 43 - 77 %   Neutro Abs 4.4 1.7 - 7.7 K/uL   Lymphocytes Relative 15 12 - 46 %   Lymphs Abs 1.0 0.7 - 4.0 K/uL   Monocytes Relative 15 (H) 3 - 12 %   Monocytes Absolute 1.0 0.1 - 1.0 K/uL   Eosinophils Relative 2 0 - 5 %   Eosinophils Absolute 0.1 0.0 - 0.7 K/uL   Basophils Relative 0 0 - 1 %   Basophils Absolute 0.0 0.0 - 0.1 K/uL  Basic metabolic panel  Result Value Ref Range   Sodium 131 (L) 135 - 145 mmol/L   Potassium 3.2 (L) 3.5 - 5.1 mmol/L   Chloride 93 (L) 101 - 111 mmol/L   CO2 31 22 - 32 mmol/L   Glucose, Bld 118 (H) 65 - 99 mg/dL   BUN 21 (H) 6 - 20 mg/dL   Creatinine, Ser 7.82 (H) 0.44 - 1.00 mg/dL   Calcium 95.6 (H) 8.9 - 10.3 mg/dL   GFR calc non Af Amer 32 (L) >60 mL/min   GFR calc Af Amer 37 (L) >60 mL/min   Anion gap 7 5 - 15  Urinalysis, Routine w reflex microscopic (not at [facility])  Result Value Ref Range    Color, Urine YELLOW YELLOW   APPearance CLEAR CLEAR   Specific Gravity, Urine 1.007 1.005 - 1.030   pH 5.0 5.0 - 8.0   Glucose, UA NEGATIVE NEGATIVE mg/dL   Hgb urine dipstick NEGATIVE NEGATIVE   Bilirubin Urine NEGATIVE NEGATIVE   Ketones, ur NEGATIVE NEGATIVE mg/dL   Protein, ur NEGATIVE NEGATIVE mg/dL   Urobilinogen, UA 1.0 0.0 - 1.0 mg/dL   Nitrite NEGATIVE NEGATIVE   Leukocytes, UA TRACE (A) NEGATIVE  Urine microscopic-add on  Result Value Ref Range  Squamous Epithelial / LPF RARE RARE   WBC, UA 0-2 <3 WBC/hpf  I-stat troponin, ED  Result Value Ref Range   Troponin i, poc 0.00 0.00 - 0.08 ng/mL   Comment 3           Ct Head Wo Contrast  06/10/2015   CLINICAL DATA:  Left-sided weakness.  EXAM: CT HEAD WITHOUT CONTRAST  TECHNIQUE: Contiguous axial images were obtained from the base of the skull through the vertex without intravenous contrast.  COMPARISON:  None.  FINDINGS: There is no evidence of mass effect, midline shift, or extra-axial fluid collections. There is no evidence of a space-occupying lesion or intracranial hemorrhage. There is no evidence of a cortical-based area of acute infarction. There is generalized cerebral atrophy. There is periventricular white matter low attenuation likely secondary to microangiopathy.  The ventricles and sulci are appropriate for the patient's age. The basal cisterns are patent.  Visualized portions of the orbits are unremarkable. There is a small left ethmoid sinus osteoma. Cerebrovascular atherosclerotic calcifications are noted.  The osseous structures are unremarkable.  IMPRESSION: 1. No acute intracranial pathology. 2. Chronic microvascular disease and cerebral atrophy.   Electronically Signed   By: Elige Ko   On: 06/10/2015 14:38   Mr Brain Wo Contrast  06/10/2015   CLINICAL DATA:  Left-sided facial droop. Left upper and lower extremity weakness. Altered mental status.  EXAM: MRI HEAD WITHOUT CONTRAST  TECHNIQUE: Multiplanar, multiecho  pulse sequences of the brain and surrounding structures were obtained without intravenous contrast.  COMPARISON:  CT head without contrast from the same day.  FINDINGS: Acute nonhemorrhagic infarct involves the right insular cortex, external capsule, and anterior right lentiform nucleus. The infarct extends into the right coronal radiata with tear through other punctate cortical lesions noted in the right temporal and frontal lobe.  T2 changes are associated.  Moderate generalized atrophy and white matter disease is also present.  Markedly attenuated flow is present in the cavernous right internal carotid artery. There is no definite flow in the right middle cerebral artery or at the right MCA bifurcation. Flow is present in both ACA vessels and in the posterior circulation. The left internal carotid artery demonstrates normal flow.  No acute hemorrhage or mass lesion is present. The sagittal images are significantly degraded by patient motion.  IMPRESSION: 1. Multi focal areas of acute nonhemorrhagic infarction involving the right MCA territory. These predominantly within the right insular cortex, portions the right temporal lobe and extending superiorly in the external capsule and coronal radiata. Additional foci are present in the right frontal lobe. 2. Attenuated or occluded flow in the cavernous right internal carotid artery and right M1 segment to lease the bifurcation. 3. Moderate atrophy and white matter disease otherwise without other acute foci of infarction. These results were called by telephone at the time of interpretation on 06/10/2015 at 5:26 pm to Dr. Carmell Austria , who verbally acknowledged these results.   Electronically Signed   By: Marin Roberts M.D.   On: 06/10/2015 17:30      EKG Interpretation None      MDM   Final diagnoses:  Weakness    Patient with generalized weakness. She had a stroke last week. She left AGAINST MEDICAL ADVICE. She returns with persistent  left-sided weakness.  Patient seen by and discussed with Dr. Rhunette Croft, who recommends consulting neurology and hospitalist.  Appreciate consult by Dr. Cyril Mourning, who recommends admission and MRI/MRA.  Also appreciate Dr. Julian Reil for admitting the patient.  Patient understands that she is going to be admitted.  She is willing to stay this time, though she comments she is not happy about it.    Roxy Horsemanobert Thais Silberstein, PA-C 06/16/15 2223  Derwood KaplanAnkit Nanavati, MD 06/17/15 [phone]

## 2015-06-16 NOTE — ED Notes (Signed)
Per EMS patient had a stroke a week ago.  Has left sided weakness, slurred speech, memory difficulty, off balance at times and difficulty chewing and swallowing.  No change since last week.  C/O just feeling weak now.  Fell yesterday.  Denies any injury just states I lost balance because I didn't have my walker.  Alert and Oriented X 4.

## 2015-06-16 NOTE — Consult Note (Signed)
Referring Physician: Dr Kathrynn Humble    Chief Complaint: left sided weakness, imbalance, fall, recent right brain infarct  HPI:                                                                                                                                         Adriana Middleton is an 81 y.o. female with a past medical history significant for HTN , MVP, depression, COPD, brought in by family for further evaluation of the above stgated symptoms. Patient was seen in this ED on 7/11 with complains of left face weakness, slurred speech, and left sided weakness and had MRI brain that demonstrated an multifocal areas of acute right MCA distribution infarcts as well as findings suggestive of markedly attenuated flow in the cavernous right internal  carotid artery and  no definite flow in the right middle cerebral artery or at the right MCA bifurcation. Patient refused to be admitted to the hospital and left against medical advised. She has been taking aspirin. Adriana Middleton returns to the ED today complaining of poor balance needing to use a walker in the house, fall at least x 1 time, and increasing weakness. She states that she cannot tolerate the weakness anymore and needs something to be done. She complains of left-sided weakness. Daughter at the bedside said that her speech is actually improved. Denies HA, vertigo, double vision, difficulty swallowing, confusion, slurred speech, language or vision impairment. Serologies significant for Na 31, K 3.2, Cr 1.45. UA without obvious infection At baseline highly functional able to cook clean and do all of her ADLs and was still driving until 3 weeks ago. She lives with her daughter and has done so for the past 5 years. Date last known well: 7/10 Time last known well: late presentation tPA Given: no, late presentation   Past Medical History  Diagnosis Date  . DEPRESSION 07/08/2007  . CATARACTS, BILATERAL 04/18/2008  . HYPERTENSION 07/08/2007  . COPD 04/18/2008  . GERD  07/08/2007  . ARTHRITIS, LEFT HIP 04/18/2008  . 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 Colostomy      partial    Family History  Problem Relation Age of Onset  . Diabetes Other   . Hypertension Other   . Cancer Other     prostate  . Heart disease Other    Social History:  reports that she has quit smoking. She has never used smokeless tobacco. She reports that she drinks alcohol. Her drug history is not on file.  Allergies:  Allergies  Allergen Reactions  . Codeine Itching  . Simvastatin Itching    Medications:  I have reviewed the patient's current medications.  ROS:                                                                                                                                       History obtained from daughter, chart review and the patient  General ROS: negative for - chills, fatigue, fever, night sweats, weight gain or weight loss Psychological ROS: negative for - behavioral disorder, hallucinations, memory difficulties, mood swings or suicidal ideation Ophthalmic ROS: negative for - blurry vision, double vision, eye pain or loss of vision ENT ROS: negative for - epistaxis, nasal discharge, oral lesions, sore throat, tinnitus or vertigo Allergy and Immunology ROS: negative for - hives or itchy/watery eyes Hematological and Lymphatic ROS: negative for - bleeding problems, bruising or swollen lymph nodes Endocrine ROS: negative for - galactorrhea, hair pattern changes, polydipsia/polyuria or temperature intolerance Respiratory ROS: negative for - cough, hemoptysis, shortness of breath or wheezing Cardiovascular ROS: negative for - chest pain, dyspnea on exertion, edema or irregular heartbeat Gastrointestinal ROS: negative for - abdominal pain, diarrhea, hematemesis,  nausea/vomiting or stool incontinence Genito-Urinary ROS: negative for - dysuria, hematuria, incontinence or urinary frequency/urgency Musculoskeletal ROS: negative for - joint swelling Neurological ROS: as noted in HPI Dermatological ROS: negative for rash and skin lesion changes  Physical exam: pleasant female in no apparent distress. Blood pressure 147/112, pulse 58, temperature 98.5 F (36.9 C), temperature source Oral, resp. rate 16, height 5' 2"  (1.575 m), weight 61.689 kg (136 lb), SpO2 100 %. Head: normocephalic. Neck: supple, no bruits, no JVD. Cardiac: no murmurs. Lungs: clear. Abdomen: soft, no tender, no mass. Extremities: no edema. Skin: no rash  Neurologic Examination:                                                                                                      General: Mental Status: Alert, oriented, thought content appropriate.  Speech fluent without evidence of aphasia.  Able to follow 3 step commands without difficulty. Cranial Nerves: II: Discs flat bilaterally; Visual fields grossly normal, pupils equal, round, reactive to light and accommodation III,IV, VI: ptosis not present, extra-ocular motions intact bilaterally V,VII: smile asymmetric due to mild left lower face wekness, facial light touch sensation normal bilaterally VIII: hearing normal bilaterally IX,X: uvula rises symmetrically XI: bilateral shoulder shrug XII: midline tongue extension without atrophy or fasciculations Motor: Significant for subtle left arm weakness Tone and bulk:normal tone throughout; no atrophy noted Sensory: Pinprick and light touch intact throughout,  bilaterally Deep Tendon Reflexes:  Right: Upper Extremity   Left: Upper extremity   biceps (C-5 to C-6) 2/4   biceps (C-5 to C-6) 2/4 tricep (C7) 2/4    triceps (C7) 2/4 Brachioradialis (C6) 2/4  Brachioradialis (C6) 2/4  Lower Extremity Lower Extremity  quadriceps (L-2 to L-4) 2/4   quadriceps (L-2 to L-4) 2/4 Achilles  (S1) 2/4   Achilles (S1) 2/4  Plantars: Right: downgoing   Left: downgoing Cerebellar: normal finger-to-nose,  normal heel-to-shin test Gait:  No tested due to multiple leads  Results for orders placed or performed during the hospital encounter of 06/16/15 (from the past 48 hour(s))  Urinalysis, Routine w reflex microscopic (not at [facility])     Status: Abnormal   Collection Time: 06/16/15  9:18 PM  Result Value Ref Range   Color, Urine YELLOW YELLOW   APPearance CLEAR CLEAR   Specific Gravity, Urine 1.007 1.005 - 1.030   pH 5.0 5.0 - 8.0   Glucose, UA NEGATIVE NEGATIVE mg/dL   Hgb urine dipstick NEGATIVE NEGATIVE   Bilirubin Urine NEGATIVE NEGATIVE   Ketones, ur NEGATIVE NEGATIVE mg/dL   Protein, ur NEGATIVE NEGATIVE mg/dL   Urobilinogen, UA 1.0 0.0 - 1.0 mg/dL   Nitrite NEGATIVE NEGATIVE   Leukocytes, UA TRACE (A) NEGATIVE  Urine microscopic-add on     Status: None   Collection Time: 06/16/15  9:18 PM  Result Value Ref Range   Squamous Epithelial / LPF RARE RARE   WBC, UA 0-2 <3 WBC/hpf  CBC with Differential/Platelet     Status: Abnormal   Collection Time: 06/16/15  9:30 PM  Result Value Ref Range   WBC 6.5 4.0 - 10.5 K/uL   RBC 4.01 3.87 - 5.11 MIL/uL   Hemoglobin 12.9 12.0 - 15.0 g/dL   HCT 36.9 36.0 - 46.0 %   MCV 92.0 78.0 - 100.0 fL   MCH 32.2 26.0 - 34.0 pg   MCHC 35.0 30.0 - 36.0 g/dL   RDW 12.3 11.5 - 15.5 %   Platelets 195 150 - 400 K/uL   Neutrophils Relative % 68 43 - 77 %   Neutro Abs 4.4 1.7 - 7.7 K/uL   Lymphocytes Relative 15 12 - 46 %   Lymphs Abs 1.0 0.7 - 4.0 K/uL   Monocytes Relative 15 (H) 3 - 12 %   Monocytes Absolute 1.0 0.1 - 1.0 K/uL   Eosinophils Relative 2 0 - 5 %   Eosinophils Absolute 0.1 0.0 - 0.7 K/uL   Basophils Relative 0 0 - 1 %   Basophils Absolute 0.0 0.0 - 0.1 K/uL  Basic metabolic panel     Status: Abnormal   Collection Time: 06/16/15  9:30 PM  Result Value Ref Range   Sodium 131 (L) 135 - 145 mmol/L   Potassium 3.2 (L) 3.5 -  5.1 mmol/L   Chloride 93 (L) 101 - 111 mmol/L   CO2 31 22 - 32 mmol/L   Glucose, Bld 118 (H) 65 - 99 mg/dL   BUN 21 (H) 6 - 20 mg/dL   Creatinine, Ser 1.45 (H) 0.44 - 1.00 mg/dL   Calcium 11.5 (H) 8.9 - 10.3 mg/dL   GFR calc non Af Amer 32 (L) >60 mL/min   GFR calc Af Amer 37 (L) >60 mL/min    Comment: (NOTE) The eGFR has been calculated using the CKD EPI equation. This calculation has not been validated in all clinical situations. eGFR's persistently <60 mL/min signify possible Chronic Kidney Disease.  Anion gap 7 5 - 15  I-stat troponin, ED     Status: None   Collection Time: 06/16/15  9:33 PM  Result Value Ref Range   Troponin i, poc 0.00 0.00 - 0.08 ng/mL   Comment 3            Comment: Due to the release kinetics of cTnI, a negative result within the first hours of the onset of symptoms does not rule out myocardial infarction with certainty. If myocardial infarction is still suspected, repeat the test at appropriate intervals.    No results found.   Assessment: 81 y.o. female with recent (06/10/15) ischemic infarct right MCA distribution. She refused admission to the hospital on 7/11 but comes back today with complains of worsening left sided weakness, imbalance, and fall x 1. Agree with admission to the hospital, complete stroke work up, PT. Aspirin pending results stroke work up. Stroke team will resume care tomorrow.  Stroke Risk Factors - age, HTN, recent stroke  Plan: 1. HgbA1c, fasting lipid panel 2. MRI, MRA  of the brain without contrast 3. Echocardiogram 4. Carotid dopplers 5. Prophylactic therapy-aspirin 6. Risk factor modification 7. Telemetry monitoring 8. Frequent neuro checks 9. PT/OT SLP 10. NPO  Dorian Pod, MD [facility] [phone]  06/16/2015, 10:35 PM

## 2015-06-16 NOTE — ED Notes (Signed)
Information booklet sent on stroke.  Given to daughter.

## 2015-06-17 ENCOUNTER — Encounter (HOSPITAL_COMMUNITY): Payer: Self-pay | Admitting: Radiology

## 2015-06-17 ENCOUNTER — Ambulatory Visit: Payer: Self-pay | Admitting: Neurology

## 2015-06-17 ENCOUNTER — Inpatient Hospital Stay (HOSPITAL_COMMUNITY): Payer: Medicare Other

## 2015-06-17 DIAGNOSIS — Z8673 Personal history of transient ischemic attack (TIA), and cerebral infarction without residual deficits: Secondary | ICD-10-CM | POA: Insufficient documentation

## 2015-06-17 DIAGNOSIS — I63511 Cerebral infarction due to unspecified occlusion or stenosis of right middle cerebral artery: Secondary | ICD-10-CM

## 2015-06-17 DIAGNOSIS — I6523 Occlusion and stenosis of bilateral carotid arteries: Secondary | ICD-10-CM

## 2015-06-17 DIAGNOSIS — I10 Essential (primary) hypertension: Secondary | ICD-10-CM

## 2015-06-17 DIAGNOSIS — E785 Hyperlipidemia, unspecified: Secondary | ICD-10-CM | POA: Insufficient documentation

## 2015-06-17 DIAGNOSIS — I6789 Other cerebrovascular disease: Secondary | ICD-10-CM

## 2015-06-17 DIAGNOSIS — I639 Cerebral infarction, unspecified: Secondary | ICD-10-CM

## 2015-06-17 DIAGNOSIS — I6529 Occlusion and stenosis of unspecified carotid artery: Secondary | ICD-10-CM | POA: Insufficient documentation

## 2015-06-17 LAB — LIPID PANEL
CHOL/HDL RATIO: 4 ratio
Cholesterol: 209 mg/dL — ABNORMAL HIGH (ref 0–200)
HDL: 52 mg/dL (ref >40)
LDL Cholesterol: 138 mg/dL — ABNORMAL HIGH (ref 0–99)
Triglycerides: 96 mg/dL (ref <150)
VLDL: 19 mg/dL (ref 0–40)

## 2015-06-17 LAB — BASIC METABOLIC PANEL
Anion gap: 10 (ref 5–15)
BUN: 18 mg/dL (ref 6–20)
CALCIUM: 11.6 mg/dL — AB (ref 8.9–10.3)
CHLORIDE: 95 mmol/L — AB (ref 101–111)
CO2: 26 mmol/L (ref 22–32)
CREATININE: 1.42 mg/dL — AB (ref 0.44–1.00)
GFR calc Af Amer: 38 mL/min — ABNORMAL LOW (ref >60)
GFR calc non Af Amer: 33 mL/min — ABNORMAL LOW (ref >60)
Glucose, Bld: 103 mg/dL — ABNORMAL HIGH (ref 65–99)
Potassium: 3.5 mmol/L (ref 3.5–5.1)
Sodium: 131 mmol/L — ABNORMAL LOW (ref 135–145)

## 2015-06-17 LAB — CBC
HCT: 38.4 % (ref 36.0–46.0)
HEMOGLOBIN: 13.2 g/dL (ref 12.0–15.0)
MCH: 31.8 pg (ref 26.0–34.0)
MCHC: 34.4 g/dL (ref 30.0–36.0)
MCV: 92.5 fL (ref 78.0–100.0)
Platelets: 174 10*3/uL (ref 150–400)
RBC: 4.15 MIL/uL (ref 3.87–5.11)
RDW: 12.4 % (ref 11.5–15.5)
WBC: 5 10*3/uL (ref 4.0–10.5)

## 2015-06-17 MED ORDER — ATORVASTATIN CALCIUM 10 MG PO TABS
20.0000 mg | ORAL_TABLET | Freq: Every day | ORAL | Status: DC
  Administered 2015-06-18 – 2015-06-19 (×2): 20 mg via ORAL
  Filled 2015-06-17 (×2): qty 2

## 2015-06-17 MED ORDER — ASPIRIN EC 325 MG PO TBEC
325.0000 mg | DELAYED_RELEASE_TABLET | Freq: Every day | ORAL | Status: DC
  Administered 2015-06-18 – 2015-06-19 (×2): 325 mg via ORAL
  Filled 2015-06-17 (×2): qty 1

## 2015-06-17 MED ORDER — IOHEXOL 350 MG/ML SOLN
50.0000 mL | Freq: Once | INTRAVENOUS | Status: AC | PRN
  Administered 2015-06-17: 50 mL via INTRAVENOUS

## 2015-06-17 NOTE — Progress Notes (Signed)
  Echocardiogram 2D Echocardiogram has been performed.  Adriana SavoyCasey N Kiaria Middleton 06/17/2015, 10:38 AM

## 2015-06-17 NOTE — Consult Note (Signed)
ELECTROPHYSIOLOGY CONSULT NOTE  Patient ID: Adriana Middleton MRN: [id number], DOB/AGE: Jul 15, 1930   Admit date: 06/16/2015 Date of Consult: 06/17/2015  Primary Physician: Geraldo Pitter, MD Primary Cardiologist: new- saw Dr. Daphane Shepherd in past Reason for Consultation: Cryptogenic stroke; recommendations regarding Implantable Loop Recorder  History of Present Illness Adriana Middleton was admitted on 06/16/2015 with acute multifocal R MCA territory ischemic stroke on 06/10/15 pt was to be admitted but left AMA.  She continued with Lt sided weakness, slurred speech and a fall.  She returned on 06/16/15.    Imaging demonstrated MRI: Multifocal acute on chronic RIGHT middle cerebral artery territory infarct .  MRA: MRA Occluded R MCA, high grade stenosis R P2, possible R ICA origin stenosis   SHE has undergone workup for stroke including echocardiogram and carotid dopplers ( which are Pending).  The patient has been monitored on telemetry which has demonstrated sinus rhythm with no arrhythmias.  Inpatient stroke work-up is to be completed with a TEE.   She has hx of MVP per pt.  No recent cardiology visits.  Occ has palpitations.  occ chest pain- "drinks a beer and goes to bed"  Echocardiogram this admission demonstrated - Left ventricle: The cavity size was normal. Systolic function was normal. The estimated ejection fraction was in the range of 55% to 60%. Wall motion was normal; there were no regional wall motion abnormalities. There was an increased relative contribution of atrial contraction to ventricular filling. Doppler parameters are consistent with abnormal left ventricular relaxation (grade 1 diastolic dysfunction). - Aortic valve: There was mild regurgitation. - Mitral valve: Calcified annulus .    Lab work is remarkable for Na 131, K+ 3.5, Cr 1.42, LDL 138;.CBC normal.  Prior to admission, the patient denies shortness of breath, dizziness, occ. palpitations, no syncope.  They  are recovering from their stroke with plans to currently go to CIR at discharge.  She has been fatigued recently.   EP has been asked to evaluate for placement of an implantable loop recorder to monitor for atrial fibrillation.  ROS is negative except as outlined above.    Past Medical History  Diagnosis Date  . DEPRESSION 07/08/2007  . CATARACTS, BILATERAL 04/18/2008  . HYPERTENSION 07/08/2007  . COPD 04/18/2008  . GERD 07/08/2007  . ARTHRITIS, LEFT HIP 04/18/2008  . 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 Colostomy      partial     Prescriptions prior to admission  Medication Sig Dispense Refill Last Dose  . aspirin EC 81 MG tablet Take 162 mg by mouth daily.   06/16/2015 at Unknown time  . atenolol-chlorthalidone (TENORETIC) 50-25 MG per tablet TAKE 1 TABLET BY MOUTH DAILY. 100 tablet 0 06/16/2015 at 830  . atorvastatin (LIPITOR) 10 MG tablet Take 10 mg by mouth daily.   06/16/2015 at Unknown time  . ibuprofen (ADVIL,MOTRIN) 200 MG tablet Take 400 mg by mouth daily as needed (pain).   06/16/2015 at 1900    Inpatient Medications:  . [START ON 06/18/2015] aspirin EC  325 mg Oral Daily  . atenolol  50 mg Oral Daily   And  . chlorthalidone  25 mg Oral Daily  . [START ON 06/18/2015] atorvastatin  20 mg Oral Daily  . heparin  5,000 Units Subcutaneous 3 times per day    Allergies:  Allergies  Allergen Reactions  . Codeine Itching  . Simvastatin Itching  History   Social History  . Marital Status: Widowed    Spouse Name: N/A  . Number of Children: N/A  . Years of Education: N/A   Occupational History  . Not on file.   Social History Main Topics  . Smoking status: Former Games developer  . Smokeless tobacco: Never Used     Comment: quit 15 yrs ago.    . Alcohol Use: No  . Drug Use: Not on file  . Sexual Activity: Not on file   Other Topics Concern  . Not on file   Social History  Narrative     Family History  Problem Relation Age of Onset  . Diabetes Other   . Hypertension Other   . Cancer Other     prostate  . Heart disease Other      Physical Exam: Filed Vitals:   06/17/15 0300 06/17/15 0500 06/17/15 0958 06/17/15 1432  BP: 158/52 141/63 150/62 126/59  Pulse: 52 47 54 62  Temp: 97.9 F (36.6 C) 98.1 F (36.7 C) 98.6 F (37 C) 98.7 F (37.1 C)  TempSrc: Oral Oral Oral Oral  Resp: Height:      Weight:      SpO2: 97% 100% 100% 100%    GEN- The patient is well appearing, alert and oriented x 3 today.   Head- normocephalic, atraumatic Eyes-  Sclera clear, conjunctiva pink Ears- hearing intact Oropharynx- clear Neck- supple, Lungs- Clear to ausculation bilaterally, normal work of breathing Heart- Regular rate and rhythm, no murmurs, rubs or gallops, PMI not laterally displaced GI- soft, NT, ND, + BS Extremities- no clubbing, cyanosis, or edema MS- no significant deformity or atrophy Skin- no rash or lesion Psych- euthymic mood, full affect   Labs:   Lab Results  Component Value Date   WBC 5.0 06/17/2015   HGB 13.2 06/17/2015   HCT 38.4 06/17/2015   MCV 92.5 06/17/2015   PLT 174 06/17/2015    Recent Labs Lab 06/17/15 0915  NA 131*  K 3.5  CL 95*  CO2 26  BUN 18  CREATININE 1.42*  CALCIUM 11.6*  GLUCOSE 103*   No results found for: CKTOTAL, CKMB, CKMBINDEX, TROPONINI Lab Results  Component Value Date   CHOL 209* 06/17/2015   CHOL 260* 04/11/2007   Lab Results  Component Value Date   HDL 52 06/17/2015   HDL 49.2 04/11/2007   Lab Results  Component Value Date   LDLCALC 138* 06/17/2015   Lab Results  Component Value Date   TRIG 96 06/17/2015   TRIG 137 04/11/2007   Lab Results  Component Value Date   CHOLHDL 4.0 06/17/2015   CHOLHDL 5.3 CALC 04/11/2007   Lab Results  Component Value Date   LDLDIRECT 173.0 04/11/2007    No results found for: DDIMER   Radiology/Studies: Ct Head Wo  Contrast  06/10/2015   CLINICAL DATA:  Left-sided weakness.  EXAM: CT HEAD WITHOUT CONTRAST  TECHNIQUE: Contiguous axial images were obtained from the base of the skull through the vertex without intravenous contrast.  COMPARISON:  None.  FINDINGS: There is no evidence of mass effect, midline shift, or extra-axial fluid collections. There is no evidence of a space-occupying lesion or intracranial hemorrhage. There is no evidence of a cortical-based area of acute infarction. There is generalized cerebral atrophy. There is periventricular white matter low attenuation likely secondary to microangiopathy.  The ventricles and sulci are appropriate for the patient's age. The basal cisterns are patent.  Visualized  portions of the orbits are unremarkable. There is a small left ethmoid sinus osteoma. Cerebrovascular atherosclerotic calcifications are noted.  The osseous structures are unremarkable.  IMPRESSION: 1. No acute intracranial pathology. 2. Chronic microvascular disease and cerebral atrophy.   Electronically Signed   By: Elige Ko   On: 06/10/2015 14:38   Mr Brain Wo Contrast  06/17/2015   CLINICAL DATA:  LEFT-sided weakness, and balance, fall. Recent RIGHT brain infarct. History of hypertension, COPD, depression.  EXAM: MRI HEAD WITHOUT CONTRAST  MRA HEAD WITHOUT CONTRAST  TECHNIQUE: Multiplanar, multiecho pulse sequences of the brain and surrounding structures were obtained without intravenous contrast. Angiographic images of the head were obtained using MRA technique without contrast.  COMPARISON:  MRI head June 10, 2015  FINDINGS: MRI HEAD FINDINGS  Motion degraded examination.  Patchy reduced diffusion RIGHT temporal lobe/insula, RIGHT basal ganglia, RIGHT parietal lobes with slight local further propagation. Corresponding low ADC values. No susceptibility artifact to suggest hemorrhage.  Ventricles and sulci are overall normal for patient's age. Patchy supratentorial white matter T2 hyperintensities  exclusive of the aforementioned abnormality, similar. Old small LEFT cerebellar infarcts. No midline shift, mass effect or mass lesions.  No abnormal extra-axial fluid collections. Status post RIGHT ocular lens implant. Mild paranasal sinus mucosal thickening with LEFT maxillary mucosal retention cyst. Trace RIGHT mastoid effusion. No abnormal sellar expansion. No cerebellar tonsillar ectopia. No suspicious calvarial bone marrow signal.  MRA HEAD FINDINGS  Motion degraded examination.  Anterior circulation: Included view of the proximal RIGHT internal carotid artery appears attenuated. Flow related enhancement of the included cervical, petrous, cavernous and supra clinoid internal carotid arteries. Mild stenosis RIGHT supraclinoid internal carotid artery. Complete loss of flow related enhancement RIGHT middle cerebral artery at the level of the mid M1 segment. Mild stenosis LEFT M1 segment. Patent anterior communicating artery. Flow related enhancement of the anterior cerebral arteries, including more distal segments.  No high-grade stenosis, aneurysm. Moderate luminal irregularity of the LEFT middle cerebral artery, to lesser extent the anterior cerebral arteries compatible with atherosclerosis.  Posterior circulation: LEFT vertebral artery is dominant. Basilar artery is patent, with normal flow related enhancement of the main branch vessels. Flow related enhancement of the posterior cerebral arteries. Focal high-grade stenosis versus pulsation artifact RIGHT P2 segments. Moderate stenosis LEFT P2 segment. Mild luminal regularity of the LEFT vertebral artery. Moderate luminal regularity of the mid to distal posterior cerebral arteries.  No large vessel occlusion, aneurysm.  IMPRESSION: MRI HEAD: Motion degraded examination. Multifocal acute on chronic RIGHT middle cerebral artery territory infarcts, similar distribution. No hemorrhagic conversion.  Involutional changes. Moderate white matter changes compatible with  chronic small vessel ischemic disease. Fall LEFT cerebellar small infarct.  MRA HEAD: Motion degraded examination. Occluded RIGHT middle cerebral artery.  Multiple stenosis of the cerebral arteries including high-grade stenosis versus pulsation artifact RIGHT P2 segment. Moderate luminal irregularity of the intracranial vessels compatible with atherosclerosis.  Possible RIGHT internal carotid artery origin stenosis, partially characterized. Considering degree of motion, findings may be better characterized on CT angiogram of the head and neck as clinically indicated.   Electronically Signed   By: Awilda Metro M.D.   On: 06/17/2015 00:16   Mr Brain Wo Contrast  06/10/2015   CLINICAL DATA:  Left-sided facial droop. Left upper and lower extremity weakness. Altered mental status.  EXAM: MRI HEAD WITHOUT CONTRAST  TECHNIQUE: Multiplanar, multiecho pulse sequences of the brain and surrounding structures were obtained without intravenous contrast.  COMPARISON:  CT head without contrast from  the same day.  FINDINGS: Acute nonhemorrhagic infarct involves the right insular cortex, external capsule, and anterior right lentiform nucleus. The infarct extends into the right coronal radiata with tear through other punctate cortical lesions noted in the right temporal and frontal lobe.  T2 changes are associated.  Moderate generalized atrophy and white matter disease is also present.  Markedly attenuated flow is present in the cavernous right internal carotid artery. There is no definite flow in the right middle cerebral artery or at the right MCA bifurcation. Flow is present in both ACA vessels and in the posterior circulation. The left internal carotid artery demonstrates normal flow.  No acute hemorrhage or mass lesion is present. The sagittal images are significantly degraded by patient motion.  IMPRESSION: 1. Multi focal areas of acute nonhemorrhagic infarction involving the right MCA territory. These predominantly  within the right insular cortex, portions the right temporal lobe and extending superiorly in the external capsule and coronal radiata. Additional foci are present in the right frontal lobe. 2. Attenuated or occluded flow in the cavernous right internal carotid artery and right M1 segment to lease the bifurcation. 3. Moderate atrophy and white matter disease otherwise without other acute foci of infarction. These results were called by telephone at the time of interpretation on 06/10/2015 at 5:26 pm to Dr. Carmell AustriaNate Pickering , who verbally acknowledged these results.   Electronically Signed   By: Marin Robertshristopher  Mattern M.D.   On: 06/10/2015 17:30   Mr Maxine GlennMra Head/brain Wo Cm  06/17/2015   CLINICAL DATA:  LEFT-sided weakness, and balance, fall. Recent RIGHT brain infarct. History of hypertension, COPD, depression.  EXAM: MRI HEAD WITHOUT CONTRAST  MRA HEAD WITHOUT CONTRAST  TECHNIQUE: Multiplanar, multiecho pulse sequences of the brain and surrounding structures were obtained without intravenous contrast. Angiographic images of the head were obtained using MRA technique without contrast.  COMPARISON:  MRI head June 10, 2015  FINDINGS: MRI HEAD FINDINGS  Motion degraded examination.  Patchy reduced diffusion RIGHT temporal lobe/insula, RIGHT basal ganglia, RIGHT parietal lobes with slight local further propagation. Corresponding low ADC values. No susceptibility artifact to suggest hemorrhage.  Ventricles and sulci are overall normal for patient's age. Patchy supratentorial white matter T2 hyperintensities exclusive of the aforementioned abnormality, similar. Old small LEFT cerebellar infarcts. No midline shift, mass effect or mass lesions.  No abnormal extra-axial fluid collections. Status post RIGHT ocular lens implant. Mild paranasal sinus mucosal thickening with LEFT maxillary mucosal retention cyst. Trace RIGHT mastoid effusion. No abnormal sellar expansion. No cerebellar tonsillar ectopia. No suspicious calvarial bone  marrow signal.  MRA HEAD FINDINGS  Motion degraded examination.  Anterior circulation: Included view of the proximal RIGHT internal carotid artery appears attenuated. Flow related enhancement of the included cervical, petrous, cavernous and supra clinoid internal carotid arteries. Mild stenosis RIGHT supraclinoid internal carotid artery. Complete loss of flow related enhancement RIGHT middle cerebral artery at the level of the mid M1 segment. Mild stenosis LEFT M1 segment. Patent anterior communicating artery. Flow related enhancement of the anterior cerebral arteries, including more distal segments.  No high-grade stenosis, aneurysm. Moderate luminal irregularity of the LEFT middle cerebral artery, to lesser extent the anterior cerebral arteries compatible with atherosclerosis.  Posterior circulation: LEFT vertebral artery is dominant. Basilar artery is patent, with normal flow related enhancement of the main branch vessels. Flow related enhancement of the posterior cerebral arteries. Focal high-grade stenosis versus pulsation artifact RIGHT P2 segments. Moderate stenosis LEFT P2 segment. Mild luminal regularity of the LEFT vertebral artery. Moderate  luminal regularity of the mid to distal posterior cerebral arteries.  No large vessel occlusion, aneurysm.  IMPRESSION: MRI HEAD: Motion degraded examination. Multifocal acute on chronic RIGHT middle cerebral artery territory infarcts, similar distribution. No hemorrhagic conversion.  Involutional changes. Moderate white matter changes compatible with chronic small vessel ischemic disease. Fall LEFT cerebellar small infarct.  MRA HEAD: Motion degraded examination. Occluded RIGHT middle cerebral artery.  Multiple stenosis of the cerebral arteries including high-grade stenosis versus pulsation artifact RIGHT P2 segment. Moderate luminal irregularity of the intracranial vessels compatible with atherosclerosis.  Possible RIGHT internal carotid artery origin stenosis,  partially characterized. Considering degree of motion, findings may be better characterized on CT angiogram of the head and neck as clinically indicated.   Electronically Signed   By: Awilda Metro M.D.   On: 06/17/2015 00:16    12-lead ECG Sinus Brady at 54 Nonspecific T abnrm, anterolateral leads Prolonged QT interval Nonspecific ST and T wave abnormality All prior EKG's in [facility] reviewed with no documented atrial fibrillation  Telemetry SR- SB  Assessment and Plan:  1. Cryptogenic stroke The patient presents with cryptogenic stroke.  The patient has a TEE planned for tomorrow AM.  I spoke at length with the patient about monitoring for afib with either a 30 day event monitor or an implantable loop recorder.  Risks, benefits, and alteratives to implantable loop recorder were discussed with the patient today.   At this time, the patient is very clear in their decision to proceed with implantable loop recorder.   Wound care was reviewed with the patient (keep incision clean and dry for 3 days).  Wound check scheduled for the office will call with date and time.  Please call with questions.  Nada Boozer, FNP-C At [facility]  Pgr:[phone] or after 5pm and on weekends call [phone] 06/17/2015.now     06/17/2015 4:46 PM  EP Attending  Patient seen and examined. I have reviewed the findings of Nada Boozer and concur. The patient has undergone TEE which did not demonstrate a cause of her stroke. We will plan to proceed with ILR insertion.  Leonia Reeves.D.

## 2015-06-17 NOTE — Progress Notes (Signed)
TEE (and loop) discussed with patient.     [facility] has been requested to perform a transesophageal echocardiogram on 7/19 at 2:00 pm for CVA.  After careful review of history and examination, the risks and benefits of transesophageal echocardiogram have been explained including risks of esophageal damage, perforation (1:10,000 risk), bleeding, pharyngeal hematoma as well as other potential complications associated with conscious sedation including aspiration, arrhythmia, respiratory failure and death. Alternatives to treatment were discussed, questions were answered. Patient is willing to proceed.   She is agreeable to the procedure, but wished me to discuss it with her daughter, [name]. Phone [phone].  Contacted the daughter by phone. She is in agreement with the TEE (and loop if needed).    Theodore DemarkBarrett, Rhonda, PA-C 06/17/2015 1:28 PM

## 2015-06-17 NOTE — Progress Notes (Signed)
PROGRESS NOTE  Adriana Middleton WUJ:[id number] DOB: Apr 24, 1930 DOA: 06/16/2015 PCP: Geraldo Pitter, MD  Assessment/Plan: Acute R MCA ischemic stroke - Now subacute 1. MRI: + for CVA 2. Neurology consulted 3. ASA 4. PT/OT/SLP  HTN - continue home meds  CKD -repeat BMP today   Code Status: full Family Communication: patient Disposition Plan: home soon   Consultants:  neuro  Procedures:      HPI/Subjective: Anxious to go home  Objective: Filed Vitals:   06/17/15 0500  BP: 141/63  Pulse: 47  Temp: 98.1 F (36.7 C)  Resp: 17   No intake or output data in the 24 hours ending 06/17/15 0811 Filed Weights   06/16/15 2104 06/17/15 0024  Weight: 61.689 kg (136 lb) 71.5 kg (157 lb 10.1 oz)    Exam:   General:  Awake, NAD  Cardiovascular: rrr  Respiratory: clear  Abdomen: +BS, soft  Musculoskeletal: uncooperative  Data Reviewed: Basic Metabolic Panel:  Recent Labs Lab 06/10/15 1352 06/16/15 2130  NA 136 131*  K 3.5 3.2*  CL 101 93*  CO2 26 31  GLUCOSE 87 118*  BUN 21* 21*  CREATININE 1.36* 1.45*  CALCIUM 11.3* 11.5*   Liver Function Tests:  Recent Labs Lab 06/10/15 1352  AST 24  ALT 11*  ALKPHOS 63  BILITOT 0.6  PROT 7.1  ALBUMIN 3.9   No results for input(s): LIPASE, AMYLASE in the last 168 hours. No results for input(s): AMMONIA in the last 168 hours. CBC:  Recent Labs Lab 06/10/15 1352 06/16/15 2130  WBC 5.0 6.5  NEUTROABS 3.4 4.4  HGB 13.3 12.9  HCT 39.6 36.9  MCV 96.1 92.0  PLT 157 195   Cardiac Enzymes: No results for input(s): CKTOTAL, CKMB, CKMBINDEX, TROPONINI in the last 168 hours. BNP (last 3 results) No results for input(s): BNP in the last 8760 hours.  ProBNP (last 3 results) No results for input(s): PROBNP in the last 8760 hours.  CBG: No results for input(s): GLUCAP in the last 168 hours.  No results found for this or any previous visit (from the past 240 hour(s)).   Studies: Mr Brain Wo  Contrast  06/17/2015   CLINICAL DATA:  LEFT-sided weakness, and balance, fall. Recent RIGHT brain infarct. History of hypertension, COPD, depression.  EXAM: MRI HEAD WITHOUT CONTRAST  MRA HEAD WITHOUT CONTRAST  TECHNIQUE: Multiplanar, multiecho pulse sequences of the brain and surrounding structures were obtained without intravenous contrast. Angiographic images of the head were obtained using MRA technique without contrast.  COMPARISON:  MRI head June 10, 2015  FINDINGS: MRI HEAD FINDINGS  Motion degraded examination.  Patchy reduced diffusion RIGHT temporal lobe/insula, RIGHT basal ganglia, RIGHT parietal lobes with slight local further propagation. Corresponding low ADC values. No susceptibility artifact to suggest hemorrhage.  Ventricles and sulci are overall normal for patient's age. Patchy supratentorial white matter T2 hyperintensities exclusive of the aforementioned abnormality, similar. Old small LEFT cerebellar infarcts. No midline shift, mass effect or mass lesions.  No abnormal extra-axial fluid collections. Status post RIGHT ocular lens implant. Mild paranasal sinus mucosal thickening with LEFT maxillary mucosal retention cyst. Trace RIGHT mastoid effusion. No abnormal sellar expansion. No cerebellar tonsillar ectopia. No suspicious calvarial bone marrow signal.  MRA HEAD FINDINGS  Motion degraded examination.  Anterior circulation: Included view of the proximal RIGHT internal carotid artery appears attenuated. Flow related enhancement of the included cervical, petrous, cavernous and supra clinoid internal carotid arteries. Mild stenosis RIGHT supraclinoid internal carotid artery. Complete loss of flow related  enhancement RIGHT middle cerebral artery at the level of the mid M1 segment. Mild stenosis LEFT M1 segment. Patent anterior communicating artery. Flow related enhancement of the anterior cerebral arteries, including more distal segments.  No high-grade stenosis, aneurysm. Moderate luminal  irregularity of the LEFT middle cerebral artery, to lesser extent the anterior cerebral arteries compatible with atherosclerosis.  Posterior circulation: LEFT vertebral artery is dominant. Basilar artery is patent, with normal flow related enhancement of the main branch vessels. Flow related enhancement of the posterior cerebral arteries. Focal high-grade stenosis versus pulsation artifact RIGHT P2 segments. Moderate stenosis LEFT P2 segment. Mild luminal regularity of the LEFT vertebral artery. Moderate luminal regularity of the mid to distal posterior cerebral arteries.  No large vessel occlusion, aneurysm.  IMPRESSION: MRI HEAD: Motion degraded examination. Multifocal acute on chronic RIGHT middle cerebral artery territory infarcts, similar distribution. No hemorrhagic conversion.  Involutional changes. Moderate white matter changes compatible with chronic small vessel ischemic disease. Fall LEFT cerebellar small infarct.  MRA HEAD: Motion degraded examination. Occluded RIGHT middle cerebral artery.  Multiple stenosis of the cerebral arteries including high-grade stenosis versus pulsation artifact RIGHT P2 segment. Moderate luminal irregularity of the intracranial vessels compatible with atherosclerosis.  Possible RIGHT internal carotid artery origin stenosis, partially characterized. Considering degree of motion, findings may be better characterized on CT angiogram of the head and neck as clinically indicated.   Electronically Signed   By: Awilda Metro M.D.   On: 06/17/2015 00:16   Mr Maxine Glenn Head/brain Wo Cm  06/17/2015   CLINICAL DATA:  LEFT-sided weakness, and balance, fall. Recent RIGHT brain infarct. History of hypertension, COPD, depression.  EXAM: MRI HEAD WITHOUT CONTRAST  MRA HEAD WITHOUT CONTRAST  TECHNIQUE: Multiplanar, multiecho pulse sequences of the brain and surrounding structures were obtained without intravenous contrast. Angiographic images of the head were obtained using MRA technique  without contrast.  COMPARISON:  MRI head June 10, 2015  FINDINGS: MRI HEAD FINDINGS  Motion degraded examination.  Patchy reduced diffusion RIGHT temporal lobe/insula, RIGHT basal ganglia, RIGHT parietal lobes with slight local further propagation. Corresponding low ADC values. No susceptibility artifact to suggest hemorrhage.  Ventricles and sulci are overall normal for patient's age. Patchy supratentorial white matter T2 hyperintensities exclusive of the aforementioned abnormality, similar. Old small LEFT cerebellar infarcts. No midline shift, mass effect or mass lesions.  No abnormal extra-axial fluid collections. Status post RIGHT ocular lens implant. Mild paranasal sinus mucosal thickening with LEFT maxillary mucosal retention cyst. Trace RIGHT mastoid effusion. No abnormal sellar expansion. No cerebellar tonsillar ectopia. No suspicious calvarial bone marrow signal.  MRA HEAD FINDINGS  Motion degraded examination.  Anterior circulation: Included view of the proximal RIGHT internal carotid artery appears attenuated. Flow related enhancement of the included cervical, petrous, cavernous and supra clinoid internal carotid arteries. Mild stenosis RIGHT supraclinoid internal carotid artery. Complete loss of flow related enhancement RIGHT middle cerebral artery at the level of the mid M1 segment. Mild stenosis LEFT M1 segment. Patent anterior communicating artery. Flow related enhancement of the anterior cerebral arteries, including more distal segments.  No high-grade stenosis, aneurysm. Moderate luminal irregularity of the LEFT middle cerebral artery, to lesser extent the anterior cerebral arteries compatible with atherosclerosis.  Posterior circulation: LEFT vertebral artery is dominant. Basilar artery is patent, with normal flow related enhancement of the main branch vessels. Flow related enhancement of the posterior cerebral arteries. Focal high-grade stenosis versus pulsation artifact RIGHT P2 segments.  Moderate stenosis LEFT P2 segment. Mild luminal regularity of the LEFT  vertebral artery. Moderate luminal regularity of the mid to distal posterior cerebral arteries.  No large vessel occlusion, aneurysm.  IMPRESSION: MRI HEAD: Motion degraded examination. Multifocal acute on chronic RIGHT middle cerebral artery territory infarcts, similar distribution. No hemorrhagic conversion.  Involutional changes. Moderate white matter changes compatible with chronic small vessel ischemic disease. Fall LEFT cerebellar small infarct.  MRA HEAD: Motion degraded examination. Occluded RIGHT middle cerebral artery.  Multiple stenosis of the cerebral arteries including high-grade stenosis versus pulsation artifact RIGHT P2 segment. Moderate luminal irregularity of the intracranial vessels compatible with atherosclerosis.  Possible RIGHT internal carotid artery origin stenosis, partially characterized. Considering degree of motion, findings may be better characterized on CT angiogram of the head and neck as clinically indicated.   Electronically Signed   By: Awilda Metroourtnay  Bloomer M.D.   On: 06/17/2015 00:16    Scheduled Meds: . aspirin EC  162 mg Oral Daily  . atenolol  50 mg Oral Daily   And  . chlorthalidone  25 mg Oral Daily  . atorvastatin  10 mg Oral Daily  . heparin  5,000 Units Subcutaneous 3 times per day   Continuous Infusions:  Antibiotics Given (last 72 hours)    None      Principal Problem:   Acute right arterial ischemic stroke, MCA (middle cerebral artery) Active Problems:   Essential hypertension   Left-sided weakness    Time spent: 25 min    VANN, JESSICA  [facility] Pager [phone]. If 7PM-7AM, please contact night-coverage at [email or web address], password [email or web address] 06/17/2015, 8:11 AM  LOS: 1 day

## 2015-06-17 NOTE — Evaluation (Signed)
Speech Language Pathology Evaluation Patient Details Name: Adriana Middleton MRN: [id number] DOB: [birth year] Today's Date: 06/17/2015 Time: [id number] SLP Time Calculation (min) (ACUTE ONLY): 15 min  Problem List:  Patient Active Problem List   Diagnosis Date Noted  . Left-sided weakness 06/16/2015  . Acute right arterial ischemic stroke, MCA (middle cerebral artery) 06/16/2015  . Facial weakness 06/10/2015  . TIA (transient ischemic attack) 06/10/2015  . UNSPECIFIED DISORDER OF PARATHYROID GLAND 11/14/2010  . CATARACTS, BILATERAL 04/18/2008  . COPD 04/18/2008  . ARTHRITIS, LEFT HIP 04/18/2008  . DEPRESSION 07/08/2007  . Essential hypertension 07/08/2007  . GERD 07/08/2007   Past Medical History:  Past Medical History  Diagnosis Date  . DEPRESSION 07/08/2007  . CATARACTS, BILATERAL 04/18/2008  . HYPERTENSION 07/08/2007  . COPD 04/18/2008  . GERD 07/08/2007  . ARTHRITIS, LEFT HIP 04/18/2008  . Unspecified disorder of parathyroid gland 11/14/2010   Past Surgical History:  Past Surgical History  Procedure Laterality Date  . Colostomy      partial   HPI:  Adriana Middleton is an 81 y.o. female with a past medical history significant for HTN , MVP, depression, COPD, brought in by family for further evaluation of left sided weakness, imbalance, fall, recent right brain infarct. Patient was seen in this ED on 7/11 with complains of left face weakness, slurred speech, and left sided weakness (LKW 06/09/2015, time unknown). MRI brain demonstrated multifocal areas of acute right MCA distribution infarcts as well as findings suggestive of markedly attenuated flow in the cavernous right internal carotid artery and no definite flow in the right middle cerebral artery or at the right MCA bifurcation. Patient refused to be admitted to the hospital and left against medical advice. Mrs. Adriana HaiLee returned to the ED 06/16/2015 complaining of poor balance needing to use a walker in the house, fall at least x 1 time, and  increasing weakness. Diagnosed with Acute and subacute right MCA territory infarcts and small L cerebellar infarct.    Assessment / Plan / Recommendation Clinical Impression  Cognitive-linguistic evaluation complete. Evaluation limited by lethargy today however patient with notable cognitive impairements impacting orientation, awareness, problem solving, and reasoning. In addition, patient with what appeared to be language of confusion early in evaluation which lessened over time, question impact of lethargy. Patient will benefit from SLP f/u including continued differential diagnosis of abilities. Recommend CIR consult.        Follow Up Recommendations  Inpatient Rehab    Frequency and Duration min 2x/week  2 weeks   Pertinent Vitals/Pain Pain Assessment: No/denies pain   SLP Goals  Potential to Achieve Goals (ACUTE ONLY): Good  SLP Evaluation Prior Functioning  Cognitive/Linguistic Baseline: Information not available (appears patient may have baseline cognitive impairements)   Cognition  Overall Cognitive Status: No family/caregiver present to determine baseline cognitive functioning Arousal/Alertness: Lethargic Orientation Level: Oriented to person;Disoriented to place;Disoriented to time;Disoriented to situation Attention: Sustained Sustained Attention: Impaired Sustained Attention Impairment: Verbal basic Memory: Impaired Memory Impairment: Retrieval deficit;Decreased recall of new information Awareness: Impaired Awareness Impairment: Intellectual impairment Problem Solving: Impaired Problem Solving Impairment: Verbal complex Safety/Judgment: Appears intact (based on questioning)    Comprehension  Auditory Comprehension Overall Auditory Comprehension: Appears within functional limits for tasks assessed Reading Comprehension Reading Status: Not tested    Expression Expression Primary Mode of Expression: Verbal Verbal Expression Overall Verbal Expression: Impaired  (intermittent language of confusion)   Oral / Motor Oral Motor/Sensory Function Overall Oral Motor/Sensory Function: Appears within functional limits for tasks assessed  Motor Speech Overall Motor Speech: Impaired Respiration: Within functional limits Phonation: Normal Resonance: Within functional limits Articulation: Impaired Level of Impairment: Conversation (mildly dysarthric) Motor Planning: Witnin functional limits   GO    [employer] MA, CCC-SLP [phone]  Adriana Middleton 06/17/2015, 3:28 PM

## 2015-06-17 NOTE — Progress Notes (Signed)
STROKE TEAM PROGRESS NOTE   HISTORY Adriana Middleton is an 81 y.o. female with a past medical history significant for HTN , MVP, depression, COPD, brought in by family for further evaluation of left sided weakness, imbalance, fall, recent right brain infarct. Patient was seen in this ED on 7/11 with complains of left face weakness, slurred speech, and left sided weakness (LKW 06/09/2015, time unknown). MRI brain demonstrated multifocal areas of acute right MCA distribution infarcts as well as findings suggestive of markedly attenuated flow in the cavernous right internal carotid artery and no definite flow in the right middle cerebral artery or at the right MCA bifurcation. Patient refused to be admitted to the hospital and left against medical advice. She has been taking aspirin. Adriana Middleton returns to the ED today 06/16/2015 complaining of poor balance needing to use a walker in the house, fall at least x 1 time, and increasing weakness. She states that she cannot tolerate the weakness anymore and needs something to be done. She complains of left-sided weakness. Daughter at the bedside said that her speech is actually improved. Denies HA, vertigo, double vision, difficulty swallowing, confusion, slurred speech, language or vision impairment. Serologies significant for Na 31, K 3.2, Cr 1.45. UA without obvious infection.  At baseline highly functional able to cook clean and do all of her ADLs and was still driving until 3 weeks ago. She lives with her daughter and has done so for the past 5 years.  Patient was not administered TPA secondary to late presentation. She was admitted for further evaluation and treatment.   SUBJECTIVE (INTERVAL HISTORY) Her daughter is at the bedside.  Overall she feels her condition is gradually improving. She reports she did not stay last week because she didn't want to. Family member feels she is getting worse - more difficulty walking using walker more than cane than she used to,  had a fall 2 days ago, poor memory. Has not driven x 3 weeks.    OBJECTIVE Temp:  [97.9 F (36.6 C)-98.7 F (37.1 C)] 98.1 F (36.7 C) (07/18 0500) Pulse Rate:  [44-60] 47 (07/18 0500) Cardiac Rhythm:  [-] Sinus bradycardia (07/18 0042) Resp:  [16-23] 17 (07/18 0500) BP: (100-173)/(45-112) 141/63 mmHg (07/18 0500) SpO2:  [96 %-100 %] 100 % (07/18 0500) Weight:  [61.689 kg (136 lb)-71.5 kg (157 lb 10.1 oz)] 71.5 kg (157 lb 10.1 oz) (07/18 0024)  No results for input(s): GLUCAP in the last 168 hours.  Recent Labs Lab 06/10/15 1352 06/16/15 2130 06/17/15 0915  NA 136 131* 131*  K 3.5 3.2* 3.5  CL 101 93* 95*  CO2 26 31 26   GLUCOSE 87 118* 103*  BUN 21* 21* 18  CREATININE 1.36* 1.45* 1.42*  CALCIUM 11.3* 11.5* 11.6*    Recent Labs Lab 06/10/15 1352  AST 24  ALT 11*  ALKPHOS 63  BILITOT 0.6  PROT 7.1  ALBUMIN 3.9    Recent Labs Lab 06/10/15 1352 06/16/15 2130 06/17/15 0915  WBC 5.0 6.5 5.0  NEUTROABS 3.4 4.4  --   HGB 13.3 12.9 13.2  HCT 39.6 36.9 38.4  MCV 96.1 92.0 92.5  PLT 157 195 174   No results for input(s): CKTOTAL, CKMB, CKMBINDEX, TROPONINI in the last 168 hours. No results for input(s): LABPROT, INR in the last 72 hours.  Recent Labs  06/16/15 2118  COLORURINE YELLOW  LABSPEC 1.007  PHURINE 5.0  GLUCOSEU NEGATIVE  HGBUR NEGATIVE  BILIRUBINUR NEGATIVE  KETONESUR NEGATIVE  PROTEINUR NEGATIVE  UROBILINOGEN 1.0  NITRITE NEGATIVE  LEUKOCYTESUR TRACE*       Component Value Date/Time   CHOL 209* 06/17/2015 0606   TRIG 96 06/17/2015 0606   HDL 52 06/17/2015 0606   CHOLHDL 4.0 06/17/2015 0606   VLDL 19 06/17/2015 0606   LDLCALC 138* 06/17/2015 0606   No results found for: HGBA1C No results found for: LABOPIA, COCAINSCRNUR, LABBENZ, AMPHETMU, THCU, LABBARB  No results for input(s): ETH in the last 168 hours.   MRI HEAD 06/17/2015    Motion degraded examination. Multifocal acute on chronic RIGHT middle cerebral artery territory  infarcts, similar distribution. No hemorrhagic conversion.  Involutional changes. Moderate white matter changes compatible with chronic small vessel ischemic disease. Fall LEFT cerebellar small infarct.    MRA HEAD 06/17/2015    Motion degraded examination. Occluded RIGHT middle cerebral artery.  Multiple stenosis of the cerebral arteries including high-grade stenosis versus pulsation artifact RIGHT P2 segment. Moderate luminal irregularity of the intracranial vessels compatible with atherosclerosis.  Possible RIGHT internal carotid artery origin stenosis, partially characterized. Considering degree of motion, findings may be better characterized on CT angiogram of the head and neck as clinically indicated.   CUS - pending  2D echo - pending  TEE - pending  CTA neck - pending     PHYSICAL EXAM  Temp:  [97.9 F (36.6 C)-98.7 F (37.1 C)] 98.1 F (36.7 C) (07/18 1719) Pulse Rate:  [44-62] 60 (07/18 1719) Resp:  [16-23] 18 (07/18 1719) BP: (100-173)/(45-112) 163/61 mmHg (07/18 1719) SpO2:  [96 %-100 %] 100 % (07/18 1719) Weight:  [136 lb (61.689 kg)-157 lb 10.1 oz (71.5 kg)] 157 lb 10.1 oz (71.5 kg) (07/18 0024)  General - Well nourished, well developed, in no apparent distress but seems depressed.  Ophthalmologic - Fundi not visualized due to noncorporation.  Cardiovascular - Regular rate and rhythm with no murmur.  Mental Status -  Level of arousal and orientation to month, place were intact, but not to year and person. Language including expression, naming, repetition, comprehension was assessed and found intact. Fund of Knowledge was assessed and was impaired.  Cranial Nerves II - XII - II - Visual field intact OU. III, IV, VI - Extraocular movements intact. V - Facial sensation intact bilaterally. VII - Facial movement showed left facial droop. VIII - Hearing & vestibular intact bilaterally. X - Palate elevates symmetrically. XI - Chin turning & shoulder shrug intact  bilaterally. XII - Tongue protrusion intact.  Motor Strength - The patient's strength was 4/5 LUE and 5-/5 LLE, RUE and RLE 5/5 and pronator drift was present on the left.  Bulk was normal and fasciculations were absent.   Motor Tone - Muscle tone was assessed at the neck and appendages and was normal.  Reflexes - The patient's reflexes were 1+ in all extremities and she had no pathological reflexes.  Sensory - Light touch, temperature/pinprick were assessed and were symmetrical.    Coordination - The patient had normal movements in the hands and feet with no ataxia or dysmetria.  Tremor was absent.  Gait and Station - not tested due to safety concerns.   ASSESSMENT/PLAN Adriana Middleton is a 81 y.o. female with history of HTN , MVP, depression, COPD presenting with left sided weakness, imbalance, fall, recent right brain infarct. She did not receive IV t-PA due to late presentation.   Stroke:  Acute and subacute right MCA territory infarcts, embolic secondary to unknown source, suspicious for atrial fibrillation   Resultant  Left hemiparesis  MRI  Acute and subacute right MCA territory infarcts  MRA  Occluded R MCA, high grade stenosis R P2  Carotid Doppler  pending   2D Echo  pending  TEE to look for embolic source. Arranged with [facility] for tomorrow.  If positive for PFO (patent foramen ovale), check bilateral lower extremity venous dopplers to rule out DVT as possible source of stroke. (I have made patient NPO after midnight tonight). - if cardiology unable to schedule, they will let me know If TEE negative, a [facility] electrophysiologist will consult and consider placement of an implantable loop recorder to evaluate for atrial fibrillation as etiology of stroke. This has been explained to patient/family by Dr. Roda ShuttersXu and they are agreeable.   LDL 138  HgbA1c pending  Heparin 5000 units sq tid for VTE prophylaxis Diet Heart  Room service appropriate?: Yes; Fluid consistency:: Thin  aspirin 81 mg orally every day and aspirin 162 mg daily prior to admission, now on aspirin 162 mg daily. Will increase to 325 mg daily.  Patient counseled to be compliant with her antithrombotic medications  Ongoing aggressive stroke risk factor management  Therapy recommendations:  Pending. Likely will need CIR. Consults pending.  Disposition:  pending   Hypertension  Permissive hypertension (OK if < 220/120) but gradually normalize in 5-7 days  Given R MCA occlusion, recommend SBP goal 130-150  Hyperlipidemia  Home meds:  lipitor 10, resumed in hospital  LDL 138, goal < 70  Increase lipitor to 20 mg daily  Continue statin at discharge  Other Stroke Risk Factors  Advanced age  Hx stroke/TIA - 1 week ago (06/10/2015) for which she refused admission  Other Active Problems  CKD  MVP  Hospital day # 1  Rhoderick MoodyBIBY,SHARON  [facility] See Amion for Pager information 06/17/2015 10:42 AM   I, the attending vascular neurologist, have personally obtained a history, examined the patient, evaluated laboratory data, individually viewed imaging studies and agree with radiology interpretations. I obtained additional history from pt's daughter at bedside. Together with the NP/PA, we formulated the assessment and plan of care which reflects our mutual decision.  I have made any additions or clarifications directly to the above note and agree with the findings and plan as currently documented.   [age] yo F with hx of HTN, MVP, depression on home ASA was admitted for right MCA territory infarcts, more extension and multifocal than last week. MRA showed right MCA cut off. Suspicious for afib vs. Right ICA stenosis. Pending CUS, 2D echo as well as A1C. She likely need TEE and loop recorder, although will do CTA to rule out ICA stenosis. Increase ASA to 325 and lipitor to 20mg . PT/OT/speech.   Marvel PlanJindong Nobie Alleyne, MD PhD Stroke  Neurology 06/17/2015 9:11 PM  To contact Stroke Continuity provider, please refer to [email or web address]. After hours, contact General Neurology

## 2015-06-17 NOTE — Evaluation (Signed)
Physical Therapy Evaluation Patient Details Name: Adriana Middleton MRN: [id number] DOB: [birth year] Today's Date: 06/17/2015   History of Present Illness  Adriana Middleton is a 81 y.o. female diagnosed with acute multifocal R MCA territory ischemic stroke on 06/10/15 when she presented to the ED at that time. Attempt was made by EDP and our service to admit the patient; however, patient elected to leave AMA instead. Pt with con't L sided weakness and then had a fall.  Clinical Impression  Pt with noted confusion, L sided neglect/inattention, L sided weakness, and impaired balance. Unclear of accuracy of report of PLOF or home set up. Pt to benefit from CIR upon d/c to achieve independence with mobiltiy for safe d/c home.     Follow Up Recommendations CIR    Equipment Recommendations  None recommended by PT    Recommendations for Other Services Rehab consult     Precautions / Restrictions Precautions Precautions: Fall Restrictions Weight Bearing Restrictions: No      Mobility  Bed Mobility Overal bed mobility: Needs Assistance Bed Mobility: Sidelying to Sit;Sit to Sidelying   Sidelying to sit: Min guard     Sit to sidelying: Min guard General bed mobility comments: increased time, use of bed rail  Transfers Overall transfer level: Needs assistance Equipment used: Rolling walker (2 wheeled) Transfers: Sit to/from Stand Sit to Stand: Min assist         General transfer comment: max directional v/c's for hand placement and walker safety, minA to achieve anterior weight shift  Ambulation/Gait Ambulation/Gait assistance: Min assist Ambulation Distance (Feet): 75 Feet Assistive device: Rolling walker (2 wheeled) Gait Pattern/deviations: Step-through pattern;Decreased stride length Gait velocity: decreased Gait velocity interpretation: Below normal speed for age/gender General Gait Details: minA for walker management, slow, guarded  Stairs            Wheelchair  Mobility    Modified Rankin (Stroke Patients Only) Modified Rankin (Stroke Patients Only) Pre-Morbid Rankin Score: No significant disability Modified Rankin: Moderately severe disability     Balance Overall balance assessment: Needs assistance Sitting-balance support: Feet supported Sitting balance-Leahy Scale: Fair     Standing balance support: Bilateral upper extremity supported Standing balance-Leahy Scale: Poor                               Pertinent Vitals/Pain Pain Assessment: No/denies pain    Home Living Family/patient expects to be discharged to:: Private residence Living Arrangements: Children Available Help at Discharge: Family;Available PRN/intermittently (daughter works during the day) Type of Home: House Home Access: Level entry     Home Layout: 1/2 bath on main level [place]: [place] - 2 wheels;Cane - single point;Shower seat Additional Comments: unsure of accuracy of PLOF and home set up due to confusion    Prior Function Level of Independence: Independent with assistive device(s)         Comments: unsure of PLOF accuracy, pt reports she drives to grocery story and uses a can, walker or furniture tow alk     Hand Dominance   Dominant Hand: Right    Extremity/Trunk Assessment   Upper Extremity Assessment: LUE deficits/detail       LUE Deficits / Details: grossly 4-/5   Lower Extremity Assessment: LLE deficits/detail   LLE Deficits / Details: grossly 4/5  Cervical / Trunk Assessment: Normal  Communication   Communication: No difficulties  Cognition Arousal/Alertness: Awake/alert Behavior During Therapy: WFL for tasks assessed/performed Overall  Cognitive Status: Impaired/Different from baseline Area of Impairment: Orientation;Attention;Memory;Following commands;Safety/judgement;Awareness;Problem solving Orientation Level: Disoriented to;Time;Situation (perseverated on getting up tomorrow for sunday school) Current  Attention Level: Sustained Memory: Decreased short-term memory Following Commands: Follows one step commands with increased time Safety/Judgement: Decreased awareness of safety;Decreased awareness of deficits Awareness: Intellectual Problem Solving: Slow processing;Difficulty sequencing;Requires verbal cues;Requires tactile cues General Comments: pt with tactile speech, reports it's 2008, perseverated on leaving today to go home because she had to get up for sunday school tomorrow    General Comments General comments (skin integrity, edema, etc.): pt with confusion    Exercises        Assessment/Plan    PT Assessment Patient needs continued PT services  PT Diagnosis Difficulty walking   PT Problem List Decreased strength;Decreased activity tolerance;Decreased balance;Decreased mobility  PT Treatment Interventions DME instruction;Gait training;Stair training;Functional mobility training;Therapeutic activities;Therapeutic exercise;Balance training   PT Goals (Current goals can be found in the Care Plan section) Acute Rehab PT Goals Patient Stated Goal: didnt' state PT Goal Formulation: Patient unable to participate in goal setting Time For Goal Achievement: 06/24/15 Potential to Achieve Goals: Good    Frequency Min 4X/week   Barriers to discharge Decreased caregiver support alone during the day    Co-evaluation               End of Session Equipment Utilized During Treatment: Gait belt Activity Tolerance: Patient tolerated treatment well Patient left: in bed;with bed alarm set;with call bell/phone within reach Nurse Communication: Mobility status         Time: 5056-9794 PT Time Calculation (min) (ACUTE ONLY): 23 min   Charges:   PT Evaluation $Initial PT Evaluation Tier I: 1 Procedure PT Treatments $Gait Training: 8-22 mins   PT G CodesKingsley Middleton 06/17/2015, 4:51 PM   Adriana Middleton, PT, DPT Pager #: [phone] Office #:  [phone]

## 2015-06-17 NOTE — Progress Notes (Signed)
Patient received from the ED at 0020, alert and oriented. Patient moves all extremities well. VSS. Mild weakness noted to left side. All safety measures in place. Will monitor closely overnight.

## 2015-06-17 NOTE — Progress Notes (Addendum)
*  PRELIMINARY RESULTS* Vascular Ultrasound Carotid Duplex (Doppler) has been completed.  Preliminary findings: 40-59% ICA stenosis bilaterally, based on systolic velocity and ratio. However, based on diastolic velocity there is 1-39% stenosis.  Farrel DemarkJill Eunice, RDMS, RVT  06/17/2015, 11:31 AM

## 2015-06-18 ENCOUNTER — Encounter (HOSPITAL_COMMUNITY)
Admission: EM | Disposition: A | Discharge status: Skilled Nursing Facility | Payer: Self-pay | Source: Home / Self Care | Attending: Internal Medicine

## 2015-06-18 ENCOUNTER — Encounter (HOSPITAL_COMMUNITY): Payer: Self-pay | Admitting: Internal Medicine

## 2015-06-18 ENCOUNTER — Inpatient Hospital Stay (HOSPITAL_COMMUNITY): Payer: Medicare Other

## 2015-06-18 DIAGNOSIS — I63511 Cerebral infarction due to unspecified occlusion or stenosis of right middle cerebral artery: Secondary | ICD-10-CM

## 2015-06-18 DIAGNOSIS — E785 Hyperlipidemia, unspecified: Secondary | ICD-10-CM

## 2015-06-18 DIAGNOSIS — I639 Cerebral infarction, unspecified: Secondary | ICD-10-CM

## 2015-06-18 DIAGNOSIS — M6289 Other specified disorders of muscle: Secondary | ICD-10-CM

## 2015-06-18 DIAGNOSIS — I351 Nonrheumatic aortic (valve) insufficiency: Secondary | ICD-10-CM

## 2015-06-18 DIAGNOSIS — I700 Atherosclerosis of aorta: Secondary | ICD-10-CM

## 2015-06-18 HISTORY — PX: TEE WITHOUT CARDIOVERSION: SHX5443

## 2015-06-18 HISTORY — PX: EP IMPLANTABLE DEVICE: SHX172B

## 2015-06-18 LAB — CREATININE, SERUM
Creatinine, Ser: 1.44 mg/dL — ABNORMAL HIGH (ref 0.44–1.00)
GFR calc non Af Amer: 33 mL/min — ABNORMAL LOW (ref >60)
GFR, EST AFRICAN AMERICAN: 38 mL/min — AB (ref >60)

## 2015-06-18 LAB — CBC
HCT: 42.3 % (ref 36.0–46.0)
Hemoglobin: 14.5 g/dL (ref 12.0–15.0)
MCH: 32 pg (ref 26.0–34.0)
MCHC: 34.3 g/dL (ref 30.0–36.0)
MCV: 93.4 fL (ref 78.0–100.0)
Platelets: 213 10*3/uL (ref 150–400)
RBC: 4.53 MIL/uL (ref 3.87–5.11)
RDW: 12.6 % (ref 11.5–15.5)
WBC: 5.4 10*3/uL (ref 4.0–10.5)

## 2015-06-18 LAB — HEMOGLOBIN A1C
Hgb A1c MFr Bld: 5.6 % (ref 4.8–5.6)
Mean Plasma Glucose: 114 mg/dL

## 2015-06-18 SURGERY — LOOP RECORDER INSERTION

## 2015-06-18 SURGERY — ECHOCARDIOGRAM, TRANSESOPHAGEAL
Anesthesia: Moderate Sedation

## 2015-06-18 MED ORDER — BUTAMBEN-TETRACAINE-BENZOCAINE 2-2-14 % EX AERO
INHALATION_SPRAY | CUTANEOUS | Status: DC | PRN
  Administered 2015-06-18: 2 via TOPICAL

## 2015-06-18 MED ORDER — FENTANYL CITRATE (PF) 100 MCG/2ML IJ SOLN
INTRAMUSCULAR | Status: DC | PRN
  Administered 2015-06-18: 12.5 ug via INTRAVENOUS

## 2015-06-18 MED ORDER — MIDAZOLAM HCL 10 MG/2ML IJ SOLN
INTRAMUSCULAR | Status: DC | PRN
  Administered 2015-06-18 (×2): 1 mg via INTRAVENOUS

## 2015-06-18 MED ORDER — LIDOCAINE VISCOUS 2 % MT SOLN
OROMUCOSAL | Status: AC
  Filled 2015-06-18: qty 15

## 2015-06-18 MED ORDER — LIDOCAINE-EPINEPHRINE 1 %-1:100000 IJ SOLN
INTRAMUSCULAR | Status: DC | PRN
  Administered 2015-06-18: 10 mL

## 2015-06-18 MED ORDER — DIPHENHYDRAMINE HCL 50 MG/ML IJ SOLN
INTRAMUSCULAR | Status: AC
  Filled 2015-06-18: qty 1

## 2015-06-18 MED ORDER — SODIUM CHLORIDE 0.9 % IV SOLN: INTRAVENOUS | Status: DC

## 2015-06-18 MED ORDER — ACETAMINOPHEN 325 MG PO TABS: 325.0000 mg | ORAL_TABLET | ORAL | Status: DC | PRN

## 2015-06-18 MED ORDER — HEPARIN SODIUM (PORCINE) 5000 UNIT/ML IJ SOLN: 5000.0000 [IU] | Freq: Three times a day (TID) | INTRAMUSCULAR | Status: DC

## 2015-06-18 MED ORDER — MIDAZOLAM HCL 5 MG/ML IJ SOLN
INTRAMUSCULAR | Status: AC
  Filled 2015-06-18: qty 2

## 2015-06-18 MED ORDER — FENTANYL CITRATE (PF) 100 MCG/2ML IJ SOLN
INTRAMUSCULAR | Status: AC
  Filled 2015-06-18: qty 2

## 2015-06-18 MED ORDER — ONDANSETRON HCL 4 MG/2ML IJ SOLN: 4.0000 mg | Freq: Four times a day (QID) | INTRAMUSCULAR | Status: DC | PRN

## 2015-06-18 SURGICAL SUPPLY — 2 items
LOOP REVEAL LINQSYS (Prosthesis & Implant Heart) ×3 IMPLANT
PACK LOOP INSERTION (CUSTOM PROCEDURE TRAY) ×3 IMPLANT

## 2015-06-18 NOTE — Interval H&P Note (Signed)
History and Physical Interval Note:  06/18/2015 2:42 PM  Adriana Middleton  has presented today for surgery, with the diagnosis of stroke  The various methods of treatment have been discussed with the patient and family. After consideration of risks, benefits and other options for treatment, the patient has consented to  Procedure(s): Loop Recorder Insertion (N/A) as a surgical intervention .  The patient's history has been reviewed, patient examined, no change in status, stable for surgery.  I have reviewed the patient's chart and labs.  Questions were answered to the patient's satisfaction.     Lewayne BuntingGregg Loy Little

## 2015-06-18 NOTE — CV Procedure (Signed)
TRANSESOPHAGEAL ECHOCARDIOGRAM (TEE) NOTE  INDICATIONS: stroke  PROCEDURE:   Informed consent was obtained prior to the procedure. The risks, benefits and alternatives for the procedure were discussed and the patient comprehended these risks.  Risks include, but are not limited to, cough, sore throat, vomiting, nausea, somnolence, esophageal and stomach trauma or perforation, bleeding, low blood pressure, aspiration, pneumonia, infection, trauma to the teeth and death.    After a procedural time-out, the patient was given 2 mg versed and 25 mcg fentanyl for moderate sedation.  The oropharynx was anesthetized with 2 cetacaine sprays.  The transesophageal probe was insertedgus and stomach without difficulty and multiple views were obtained.  The patient was kept under observation until the patient left the procedure room.  The patient left the procedure room in stable condition.   Agitated microbubble saline contrast was administered.  COMPLICATIONS:    There were no immediate complications.  Findings:  1. LEFT VENTRICLE: The left ventricular wall thickness is mildly increased.  The left ventricular cavity is normal in size. Wall motion is normal.  LVEF is 55-60%.  2. RIGHT VENTRICLE:  The right ventricle is normal in structure and function without any thrombus or masses.    3. LEFT ATRIUM:  The left atrium is dilated in size without any thrombus or masses.  There is not spontaneous echo contrast ("smoke") in the left atrium consistent with a low flow state.  4. LEFT ATRIAL APPENDAGE:  The large left atrial appendage is free of any thrombus or masses. The appendage has single lobes. Pulse doppler indicates high flow in the appendage.  5. ATRIAL SEPTUM:  The atrial septum is aneurysmal, preferentially bowed toward the RA (Type 1R) and appears intact and is free of thrombus and/or masses.  There is no evidence for interatrial shunting by color doppler and saline microbubble.  6. RIGHT  ATRIUM:  The right atrium is normal in size and function without any thrombus or masses.  7. MITRAL VALVE:  The mitral valve is normal in structure and function with trace to mild regurgitation.  There were no vegetations or stenosis.  8. AORTIC VALVE:  The aortic valve is trileaflet and sclerosed. The non-coronary cusp has decreased mobility. There is mild to moderate regurgitation.  There were no vegetations or stenosis  9. TRICUSPID VALVE:  The tricuspid valve is normal in structure and function with trivial regurgitation.  There were no vegetations or stenosis  10.  PULMONIC VALVE:  The pulmonic valve is normal in structure and function with no regurgitation.  There were no vegetations or stenosis.   11. AORTIC ARCH, ASCENDING AND DESCENDING AORTA:  There was grade 3 Myrtis Ser(Katz et. Al, 1992) atherosclerosis of the distal ascending aorta, aortic arch, and proximal descending aorta.  12. PULMONARY VEINS: Anomalous pulmonary venous return was not noted.  13. PERICARDIUM: The pericardium appeared normal and non-thickened.  There is no pericardial effusion.  IMPRESSION:   1. No LAA thromubus 2. Type 1R atrial septal aneurysm, but no PFO by saline microbubble contrast (even with provocative maneuvers) 3. Dilated LAA 4. Mild LVH 5. LVEF 55-60%, normal wall motion 6. Grade 3 atheroma of the distal ascending aorta, aortic arch and proximal descending aorta (possible source of embolism)  RECOMMENDATIONS:    1. No clear intracardiac source of embolus. Cannot exclude atheroembolism from the aorta which has heavy plaque burden. 2. Given age and risk factors, still reasonable to consider an ILR. 3. Discussed findings with patient's daughter over the phone per her  request.  Time Spent Directly with the Patient:  30 minutes   Chrystie Nose, MD, [facility] Attending Cardiologist [facility]  06/18/2015, 2:16 PM

## 2015-06-18 NOTE — Consult Note (Signed)
Physical Medicine and Rehabilitation Consult  Reason for Consult: Left sided weakness, left neglect, cognitive deficits Referring Physician: Dr. Benjamine MolaVann   HPI: Adriana Middleton is a 81 y.o. RH-female with history of HTN, COPD, complains of left face weakness, slurred speech, and left sided weakness due to acuate R-MCA infarct on 06/10/15 but refused to be admitted and left AMA.  She was readmitted on 06/16/15 with poor balance, memory deficits, increasing weakness with fall and inability to walk. MRI/MRA brain revealed Multifocal acute on chronic RIGHT middle cerebral artery territory infarcts, occluded R-MCA artery and multiple stenosis of cerebral arteries including question of high grade stenosis Right P2 segment.  CTA neck with calcified atherosclerosis of the aortic arch with intimal hematoma resulting in moderate stenosis LEFT, 50% stenosis bilateral ICA and high grade stenosis/occusion R-VA at origin and high grade stenosis L-VA origin. 2D echo with EF 55-60% with grade 1 diastolic dysfunction and calcified mitral valve.  Dr. Roda ShuttersXu questions A fib as cause of embolic CVA and TEE (with Loop recorder if negative) ordered to rule out embolic source. Patient with resultant left inattention, left sided weakness, poor balance as well as cognitive deficits.  PT/OT evaluations completed today and CIR recommended by MD and Rehab team.    ROS    Past Medical History  Diagnosis Date  . DEPRESSION 07/08/2007  . CATARACTS, BILATERAL 04/18/2008  . HYPERTENSION 07/08/2007  . COPD 04/18/2008  . GERD 07/08/2007  . ARTHRITIS, LEFT HIP 04/18/2008  . 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 Colostomy      partial    Family History  Problem Relation Age of Onset  . Diabetes Other   . Hypertension Other   . Cancer Other     prostate  . Heart disease Other     Social History:  Lives with family. Independent and driving till four weeks  ago.  Used AD or furniture walked at home. Per reports she quit smoking--15 years ago.  She has never used smokeless tobacco. She reports that she does not drink alcohol. Her drug history is not on file.    Allergies  Allergen Reactions  . Codeine Itching  . Simvastatin Itching    Medications Prior to Admission  Medication Sig Dispense Refill  . aspirin EC 81 MG tablet Take 162 mg by mouth daily.    Marland Kitchen. atenolol-chlorthalidone (TENORETIC) 50-25 MG per tablet TAKE 1 TABLET BY MOUTH DAILY. 100 tablet 0  . atorvastatin (LIPITOR) 10 MG tablet Take 10 mg by mouth daily.    Marland Kitchen. ibuprofen (ADVIL,MOTRIN) 200 MG tablet Take 400 mg by mouth daily as needed (pain).      Home: Home Living Family/patient expects to be discharged to:: Private residence Living Arrangements: Children (daughter) Available Help at Discharge: Family, Available PRN/intermittently Type of Home: House Home Access: Stairs to enter Secretary/administratorntrance Stairs-Number of Steps: 4-5 Home Layout: One level Bathroom Shower/Tub: Engineer, manufacturing systemsTub/shower unit Bathroom Toilet: Standard Home Equipment: Environmental consultantWalker - 2 wheels, [employer] Service[employer] CompanyCane - single point, Information systems managerhower seat, Wheelchair - manual Additional Comments: unsure of accuracy of PLOF and home set up due to confusion and no family present  Functional History: Prior Function Level of Independence: Independent with assistive device(s) Comments: Pt reports using cane, walker or w/c depending on the day Functional Status:  Mobility: Bed Mobility Overal bed mobility: Needs Assistance Bed Mobility: Sidelying to Sit, Sit to Sidelying Sidelying to sit: Min guard Sit to sidelying:  Min guard General bed mobility comments: Pt sitting EOB upon OT arrival Transfers Overall transfer level: Needs assistance Equipment used: Rolling walker (2 wheeled) Transfers: Sit to/from Stand Sit to Stand: Min assist General transfer comment: VC's for hand placement and walker safety Ambulation/Gait Ambulation/Gait assistance: Min  assist Ambulation Distance (Feet): 75 Feet Assistive device: Rolling walker (2 wheeled) Gait Pattern/deviations: Step-through pattern, Decreased stride length General Gait Details: minA for walker management, slow, guarded Gait velocity: decreased Gait velocity interpretation: Below normal speed for age/gender    ADL: ADL Overall ADL's : Needs assistance/impaired Eating/Feeding: NPO Grooming: Wash/dry hands, Wash/dry face, Min guard, Standing Upper Body Bathing: Minimal assitance, Sitting Lower Body Bathing: Maximal assistance, Sit to/from stand Upper Body Dressing : Minimal assistance, Sitting Lower Body Dressing: Maximal assistance, Sit to/from stand Lower Body Dressing Details (indicate cue type and reason): When donning socks, Pt able to lift leg and point foot to assist; Pt unable to reach feet to don socks Toilet Transfer: Minimal assistance, Cueing for safety, Ambulation, Regular Toilet, RW Toilet Transfer Details (indicate cue type and reason): VC's for positioning on toilet Toileting- Clothing Manipulation and Hygiene: Min guard, Sit to/from Aeronautical engineer Details (indicate cue type and reason): Pt reports PLOF sitting on edge of tub and swinging legs into tub  Functional mobility during ADLs: Minimal assistance, Rolling walker, Cueing for safety (VC's to place L foot on floor) General ADL Comments: Pt able to follow 1 step commands with increased time; Pt incontinent sitting EOB upon OT arrival  Cognition: Cognition Overall Cognitive Status: No family/caregiver present to determine baseline cognitive functioning Arousal/Alertness: Lethargic Orientation Level: Oriented X4 Attention: Sustained Sustained Attention: Impaired Sustained Attention Impairment: Verbal basic Memory: Impaired Memory Impairment: Retrieval deficit, Decreased recall of new information Awareness: Impaired Awareness Impairment: Intellectual impairment Problem Solving: Impaired Problem  Solving Impairment: Verbal complex Safety/Judgment: Appears intact (based on questioning) Cognition Arousal/Alertness: Awake/alert Behavior During Therapy: WFL for tasks assessed/performed Overall Cognitive Status: No family/caregiver present to determine baseline cognitive functioning Area of Impairment: Orientation, Safety/judgement, Awareness, Problem solving, Following commands, Attention Orientation Level: Disoriented to, Time, Situation Current Attention Level: Sustained Memory: Decreased short-term memory Following Commands: Follows one step commands with increased time Safety/Judgement: Decreased awareness of safety, Decreased awareness of deficits Awareness: Intellectual Problem Solving: Slow processing, Difficulty sequencing, Requires verbal cues, Requires tactile cues General Comments: Pt reports it is 2005, and that she needs "to go home and get the rats out of her garden". When asked who she would call for help at home she says 911.   Blood pressure 140/58, pulse 50, temperature 98.2 F (36.8 C), temperature source Oral, resp. rate 16, height 5\' 1"  (1.549 m), weight 71.5 kg (157 lb 10.1 oz), SpO2 98 %. Physical Exam  Constitutional: No distress.  HENT:  Head: Atraumatic.  Eyes: Right eye exhibits no discharge. Left eye exhibits no discharge.  Neck: No thyromegaly present.  Cardiovascular: Normal rate.   Respiratory: Effort normal.  GI: Soft.  Neurological:  Alert. Poor insight and awareness. Moves all 4's but inconsistent effort. Left central 7. Left inattention. Senses pain in all 4's.   Psychiatric:  Flat and disengaged    No results found for this or any previous visit (from the past 24 hour(s)). Ct Angio Neck W/cm &/or Wo/cm  06/18/2015   CLINICAL DATA:  Carotid stenosis. History of LEFT-sided weakness, hypertension.  EXAM: CT ANGIOGRAPHY NECK  TECHNIQUE: Multidetector CT imaging of the neck was performed using the standard protocol during bolus administration of  intravenous contrast. Multiplanar CT image reconstructions and MIPs were obtained to evaluate the vascular anatomy. Carotid stenosis measurements (when applicable) are obtained utilizing NASCET criteria, using the distal internal carotid diameter as the denominator.  CONTRAST:  50mL OMNIPAQUE IOHEXOL 350 MG/ML SOLN  COMPARISON:  MRA  head June 16, 2015  FINDINGS: Severe calcific atherosclerosis of the aortic arch with intimal irregularity consistent with hematoma. Two vessel aortic arch, innominate artery and LEFT Common carotid artery are widely patent with mild intimal thickening. Calcific atherosclerosis of LEFT subclavian artery origin resulting in at least moderate stenosis though, pulsation artifact limits assessment. Origin of the RIGHT Common carotid artery is widely patent.  Bilateral Common carotid arteries are widely patent, coursing in a straight line fashion. Severe calcific atherosclerosis the LEFT carotid bulb resulting in 50% LEFT internal carotid artery stenosis by NASCET criteria. Calcific atherosclerosis in intimal thickening of the RIGHT carotid bulb resulting in 50% stenosis.  Calcific atherosclerosis of the LEFT vertebral artery origin resultant high-grade stenosis. High-grade stenosis RIGHT vertebral artery origin, with poststenotic dilatation.  No dissection, no pseudoaneurysm. No suspicious luminal irregularity. No contrast extravasation.  Soft tissues are nonsuspicious. No acute osseous process though bone windows have not been submitted. No destructive bony lesions. Moderate degenerative change of the cervical spine with grade 1 C3-4, grade 1 C4-5 anterolisthesis on degenerative basis.  IMPRESSION: Calcific atherosclerosis of the aortic arch with intimal hematoma resulting in moderate stenosis LEFT subclavian artery origin.  Atherosclerosis resulting in 50% stenosis of bilateral internal carotid artery origins.  High-grade stenosis/occlusion RIGHT vertebral artery origin with high-grade  stenosis LEFT vertebral artery origin.  No large vessel occlusion.   Electronically Signed   By: Awilda Metro M.D.   On: 06/18/2015 00:10   Mr Brain Wo Contrast  06/17/2015   CLINICAL DATA:  LEFT-sided weakness, and balance, fall. Recent RIGHT brain infarct. History of hypertension, COPD, depression.  EXAM: MRI HEAD WITHOUT CONTRAST  MRA HEAD WITHOUT CONTRAST  TECHNIQUE: Multiplanar, multiecho pulse sequences of the brain and surrounding structures were obtained without intravenous contrast. Angiographic images of the head were obtained using MRA technique without contrast.  COMPARISON:  MRI head June 10, 2015  FINDINGS: MRI HEAD FINDINGS  Motion degraded examination.  Patchy reduced diffusion RIGHT temporal lobe/insula, RIGHT basal ganglia, RIGHT parietal lobes with slight local further propagation. Corresponding low ADC values. No susceptibility artifact to suggest hemorrhage.  Ventricles and sulci are overall normal for patient's age. Patchy supratentorial white matter T2 hyperintensities exclusive of the aforementioned abnormality, similar. Old small LEFT cerebellar infarcts. No midline shift, mass effect or mass lesions.  No abnormal extra-axial fluid collections. Status post RIGHT ocular lens implant. Mild paranasal sinus mucosal thickening with LEFT maxillary mucosal retention cyst. Trace RIGHT mastoid effusion. No abnormal sellar expansion. No cerebellar tonsillar ectopia. No suspicious calvarial bone marrow signal.  MRA HEAD FINDINGS  Motion degraded examination.  Anterior circulation: Included view of the proximal RIGHT internal carotid artery appears attenuated. Flow related enhancement of the included cervical, petrous, cavernous and supra clinoid internal carotid arteries. Mild stenosis RIGHT supraclinoid internal carotid artery. Complete loss of flow related enhancement RIGHT middle cerebral artery at the level of the mid M1 segment. Mild stenosis LEFT M1 segment. Patent anterior communicating  artery. Flow related enhancement of the anterior cerebral arteries, including more distal segments.  No high-grade stenosis, aneurysm. Moderate luminal irregularity of the LEFT middle cerebral artery, to lesser extent the anterior cerebral arteries compatible with atherosclerosis.  Posterior circulation: LEFT vertebral artery is  dominant. Basilar artery is patent, with normal flow related enhancement of the main branch vessels. Flow related enhancement of the posterior cerebral arteries. Focal high-grade stenosis versus pulsation artifact RIGHT P2 segments. Moderate stenosis LEFT P2 segment. Mild luminal regularity of the LEFT vertebral artery. Moderate luminal regularity of the mid to distal posterior cerebral arteries.  No large vessel occlusion, aneurysm.  IMPRESSION: MRI HEAD: Motion degraded examination. Multifocal acute on chronic RIGHT middle cerebral artery territory infarcts, similar distribution. No hemorrhagic conversion.  Involutional changes. Moderate white matter changes compatible with chronic small vessel ischemic disease. Fall LEFT cerebellar small infarct.  MRA HEAD: Motion degraded examination. Occluded RIGHT middle cerebral artery.  Multiple stenosis of the cerebral arteries including high-grade stenosis versus pulsation artifact RIGHT P2 segment. Moderate luminal irregularity of the intracranial vessels compatible with atherosclerosis.  Possible RIGHT internal carotid artery origin stenosis, partially characterized. Considering degree of motion, findings may be better characterized on CT angiogram of the head and neck as clinically indicated.   Electronically Signed   By: Awilda Metro M.D.   On: 06/17/2015 00:16   Mr Maxine Glenn Head/brain Wo Cm  06/17/2015   CLINICAL DATA:  LEFT-sided weakness, and balance, fall. Recent RIGHT brain infarct. History of hypertension, COPD, depression.  EXAM: MRI HEAD WITHOUT CONTRAST  MRA HEAD WITHOUT CONTRAST  TECHNIQUE: Multiplanar, multiecho pulse sequences of  the brain and surrounding structures were obtained without intravenous contrast. Angiographic images of the head were obtained using MRA technique without contrast.  COMPARISON:  MRI head June 10, 2015  FINDINGS: MRI HEAD FINDINGS  Motion degraded examination.  Patchy reduced diffusion RIGHT temporal lobe/insula, RIGHT basal ganglia, RIGHT parietal lobes with slight local further propagation. Corresponding low ADC values. No susceptibility artifact to suggest hemorrhage.  Ventricles and sulci are overall normal for patient's age. Patchy supratentorial white matter T2 hyperintensities exclusive of the aforementioned abnormality, similar. Old small LEFT cerebellar infarcts. No midline shift, mass effect or mass lesions.  No abnormal extra-axial fluid collections. Status post RIGHT ocular lens implant. Mild paranasal sinus mucosal thickening with LEFT maxillary mucosal retention cyst. Trace RIGHT mastoid effusion. No abnormal sellar expansion. No cerebellar tonsillar ectopia. No suspicious calvarial bone marrow signal.  MRA HEAD FINDINGS  Motion degraded examination.  Anterior circulation: Included view of the proximal RIGHT internal carotid artery appears attenuated. Flow related enhancement of the included cervical, petrous, cavernous and supra clinoid internal carotid arteries. Mild stenosis RIGHT supraclinoid internal carotid artery. Complete loss of flow related enhancement RIGHT middle cerebral artery at the level of the mid M1 segment. Mild stenosis LEFT M1 segment. Patent anterior communicating artery. Flow related enhancement of the anterior cerebral arteries, including more distal segments.  No high-grade stenosis, aneurysm. Moderate luminal irregularity of the LEFT middle cerebral artery, to lesser extent the anterior cerebral arteries compatible with atherosclerosis.  Posterior circulation: LEFT vertebral artery is dominant. Basilar artery is patent, with normal flow related enhancement of the main branch  vessels. Flow related enhancement of the posterior cerebral arteries. Focal high-grade stenosis versus pulsation artifact RIGHT P2 segments. Moderate stenosis LEFT P2 segment. Mild luminal regularity of the LEFT vertebral artery. Moderate luminal regularity of the mid to distal posterior cerebral arteries.  No large vessel occlusion, aneurysm.  IMPRESSION: MRI HEAD: Motion degraded examination. Multifocal acute on chronic RIGHT middle cerebral artery territory infarcts, similar distribution. No hemorrhagic conversion.  Involutional changes. Moderate white matter changes compatible with chronic small vessel ischemic disease. Fall LEFT cerebellar small infarct.  MRA HEAD: Motion degraded examination.  Occluded RIGHT middle cerebral artery.  Multiple stenosis of the cerebral arteries including high-grade stenosis versus pulsation artifact RIGHT P2 segment. Moderate luminal irregularity of the intracranial vessels compatible with atherosclerosis.  Possible RIGHT internal carotid artery origin stenosis, partially characterized. Considering degree of motion, findings may be better characterized on CT angiogram of the head and neck as clinically indicated.   Electronically Signed   By: Awilda Metro M.D.   On: 06/17/2015 00:16    Assessment/Plan: Diagnosis: right MCA infarct 1. Does the need for close, 24 hr/day medical supervision in concert with the patient's rehab needs make it unreasonable for this patient to be served in a less intensive setting? Yes 2. Co-Morbidities requiring supervision/potential complications: htn, bradycardia 3. Due to bladder management, bowel management, safety, skin/wound care, disease management, medication administration, pain management and patient education, does the patient require 24 hr/day rehab nursing? Yes 4. Does the patient require coordinated care of a physician, rehab nurse, PT (1-2 hrs/day, 5 days/week), OT (1-2 hrs/day, 5 days/week) and SLP (1-2 hrs/day, 5 days/week)  to address physical and functional deficits in the context of the above medical diagnosis(es)? Yes Addressing deficits in the following areas: balance, endurance, locomotion, strength, transferring, bowel/bladder control, bathing, dressing, feeding, grooming, toileting, cognition and psychosocial support 5. Can the patient actively participate in an intensive therapy program of at least 3 hrs of therapy per day at least 5 days per week? Yes and Potentially 6. The potential for patient to make measurable gains while on inpatient rehab is good 7. Anticipated functional outcomes upon discharge from inpatient rehab are modified independent and supervision  with PT, modified independent and supervision with OT, modified independent and supervision with SLP. 8. Estimated rehab length of stay to reach the above functional goals is: 7-11 days 9. Does the patient have adequate social supports and living environment to accommodate these discharge functional goals? Yes and Potentially 10. Anticipated D/C setting: Home 11. Anticipated post D/C treatments: HH therapy 12. Overall Rehab/Functional Prognosis: good  RECOMMENDATIONS: This patient's condition is appropriate for continued rehabilitative care in the following setting: CIR Patient has agreed to participate in recommended program. Potentially Note that insurance prior authorization may be required for reimbursement for recommended care.  Comment: Await therapy evals. The patient has to want to participate in our program. Sister would like her to come as they can provide only intermittent help.    06/18/2015

## 2015-06-18 NOTE — Clinical Social Work Placement (Signed)
   CLINICAL SOCIAL WORK PLACEMENT  NOTE  Date:  06/18/2015  Patient Details  Name: Elige Kovelyn E Flye MRN: [id number] Date of Birth: [birth year]  Clinical Social Work is seeking post-discharge placement for this patient at the Skilled  Nursing Facility level of care (*CSW will initial, date and re-position this form in  chart as items are completed):  Yes   Patient/family provided with [facility] Clinical Social Work Department's list of facilities offering this level of care within the geographic area requested by the patient (or if unable, by the patient's family).  Yes   Patient/family informed of their freedom to choose among providers that offer the needed level of care, that participate in Medicare, Medicaid or managed care program needed by the patient, have an available bed and are willing to accept the patient.  Yes   Patient/family informed of [place] [facility] and [facility], as well as of the fact that they are under no obligation to receive care at these facilities.  PASRR submitted to EDS on 06/18/15     PASRR number received on 06/18/15     Existing PASRR number confirmed on       FL2 transmitted to all facilities in geographic area requested by pt/family on 06/18/15     FL2 transmitted to all facilities within larger geographic area on       Patient informed that his/her managed care company has contracts with or will negotiate with certain facilities, including the following:            Patient/family informed of bed offers received.  Patient chooses bed at       Physician recommends and patient chooses bed at      Patient to be transferred to   on  .  Patient to be transferred to facility by       Patient family notified on   of transfer.  Name of family member notified:        PHYSICIAN Please prepare prescriptions     Additional Comment:    _______________________________________________ Derenda FennelBashira Kamarah Bilotta, MSW,  LCSWA [phone] 06/18/2015 11:37 AM

## 2015-06-18 NOTE — Progress Notes (Signed)
Physical Therapy Treatment Patient Details Name: Adriana Middleton MRN: [id number] DOB: [birth year] Today's Date: 06/18/2015    History of Present Illness  81 y.o. female diagnosed with acute multifocal R MCA territory ischemic stroke on 06/10/15 when she presented to the ED at that time. Attempt was made by EDP to admit the patient; however, patient elected to leave AMA instead. Pt with con't L sided weakness and then had a fall.    PT Comments    Pt is progressing toward goals as noted in an increased ambulation tolerance. Pt still has mental and physical impairments. Pt continues to demonstrates L side neglect, impaired executive function and balance . Pt remains unsafe to be home alone. Spoke with Pt and family member at about CIR however Pt request to be DC to (SNF) [facility].Continue to recommended skilled PT to help encourage functional independence to be ready for DC.   Follow Up Recommendations  SNF (pt desires [facility])     Equipment Recommendations  None recommended by PT    Recommendations for Other Services Rehab consult     Precautions / Restrictions Precautions Precautions: Fall Restrictions Weight Bearing Restrictions: No    Mobility  Bed Mobility Overal bed mobility: Needs Assistance Bed Mobility: Sidelying to Sit;Sit to Sidelying   Sidelying to sit: Min guard     Sit to sidelying: Min guard General bed mobility comments: Pt is able to perform bed mobilty with use of bed railing.  Transfers Overall transfer level: Needs assistance Equipment used: Rolling walker (2 wheeled) Transfers: Sit to/from Stand Sit to Stand: Min guard         General transfer comment: VC's for hand placement and walker safety. Increased time for Pt sit to stand.  Ambulation/Gait Ambulation/Gait assistance: Min assist Ambulation Distance (Feet): 200 Feet Assistive device: Rolling walker (2 wheeled) Gait Pattern/deviations: Step-through pattern;Decreased  stride length;Trunk flexed;Narrow base of support Gait velocity: slow Gait velocity interpretation: Below normal speed for age/gender General Gait Details: Min A for balances and L side neglect, when asked to make the next left pt unable to identify left and turn. pt demo'd no peripheral vision on L side   Stairs            Wheelchair Mobility    Modified Rankin (Stroke Patients Only) Modified Rankin (Stroke Patients Only) Pre-Morbid Rankin Score: No significant disability Modified Rankin: Moderately severe disability     Balance Overall balance assessment: Needs assistance Sitting-balance support: Feet supported;No upper extremity supported Sitting balance-Leahy Scale: Fair     Standing balance support: Bilateral upper extremity supported;During functional activity Standing balance-Leahy Scale: Poor Standing balance comment: Min gaurd A to stand, increased time                    Cognition Arousal/Alertness: Awake/alert Behavior During Therapy: WFL for tasks assessed/performed Overall Cognitive Status: Impaired/Different from baseline Area of Impairment: Orientation;Attention;Following commands;Awareness;Problem solving Orientation Level: Disoriented to;Time;Situation Current Attention Level: Sustained Memory: Decreased short-term memory Following Commands: Follows one step commands with increased time Safety/Judgement: Decreased awareness of safety;Decreased awareness of deficits Awareness: Intellectual Problem Solving: Slow processing;Decreased initiation;Requires verbal cues General Comments: Pt still has difficultly recalling the present date and year. Pt also continues to show L side neglect.    Exercises      General Comments General comments (skin integrity, edema, etc.): Pt still shows L side neglect, Pt leans to the R lateral side when sitting unsupported for long period of time.  Pertinent Vitals/Pain Pain Assessment: No/denies pain     Home Living                      Prior Function            PT Goals (current goals can now be found in the care plan section) Acute Rehab PT Goals Patient Stated Goal: to go home Progress towards PT goals: Progressing toward goals    Frequency  Min 4X/week    PT Plan Discharge plan needs to be updated    Co-evaluation             End of Session Equipment Utilized During Treatment: Gait belt Activity Tolerance: Patient tolerated treatment well Patient left: in bed;with bed alarm set;with call bell/phone within reach     Time: 0925-1002 PT Time Calculation (min) (ACUTE ONLY): 37 min  Charges:  $Gait Training: 23-37 mins                    G Codes:      Carren RangRobby Aliveah Gallant 06/18/2015, 1:12 PM  Carren Rangobby Nickolus Wadding, SPTA (student physical therapy assistant) Acute Rehab [phone]

## 2015-06-18 NOTE — Clinical Social Work Note (Signed)
Clinical Social Work Assessment  Patient Details  Name: Adriana Middleton MRN: [id number] Date of Birth: [birth year]  Date of referral:  06/18/15               Reason for consult:  Facility Placement, Discharge Planning                Permission sought to share information with:  Case Manager, Facility Sport and exercise psychologist, PCP, Family Supports Permission granted to share information::  Yes, Verbal Permission Granted  Name::      Adriana Middleton )  Agency::   (SNF's )  Relationship::   (Daughter )  Contact Information:   [phone])  Housing/Transportation Living arrangements for the past 2 months:  [place] of Information:  Patient, Adult Children Patient Interpreter Needed:  None Criminal Activity/Legal Involvement Pertinent to Current Situation/Hospitalization:  No - Comment as needed Significant Relationships:  Adult Children Lives with:  Adult Children Do you feel safe going back to the place where you live?  Yes Need for family participation in patient care:  Yes (Comment)  Care giving concerns:  No care giving concerns reported by patient or family at this time.     Social Worker assessment / plan:  Holiday representative met with patient and pt's dtr, Adriana Middleton in reference to post-acute placement. CSW introduced CSW role and SNF process. CSW reviewed and provided SNF list. PT currently recommending CIR placement however pt wishes for SNF placement. Pt's dtr reported that pt lives with her and prefers [facility] due to facility being located in their neighborhood. Pt's dtr further reported pt has plenty of family support. Pt stated she has never been a resident of a facility however was employed in health care years ago. Pt further stated she has recieved positive feedback from resident of [facility]. CSW to complete FL-2 and fax clinicals in [place]. Patient to have TEE today. No further concerns reported by family at this time. CSW will continue to  follow pt and pt's family for continued support and to facilitate pt's discharge needs once medically stable.   Employment status:  Retired Nurse, adult PT Recommendations:  Inpatient [place] / Referral to community resources:  [place]  Patient/Family's Response to care:  Pt lying in bed alert and oriented x4. Pt declining CIR/ IP REHAB placement and wishes for SNF placement at [facility]). Pt's dtr also agreeable to placement at [facility]. Pt's dtr at bedside involved in pt's care. Pt and pt's dtr both pleasant and appreciated social work intervention.   Patient/Family's Understanding of and Emotional Response to Diagnosis, Current Treatment, and Prognosis:  Pt's dtr aware of pt having TEE scheduled for today. Pt and family knowledgeable of medical diagnosis and current on-going work up and treatment.   Emotional Assessment Appearance:  Appears stated age Attitude/Demeanor/Rapport:   (Pleasant ) Affect (typically observed):  Appropriate, Calm, Pleasant Orientation:  Oriented to Self, Oriented to Place, Oriented to  Time, Oriented to Situation Alcohol / Substance use:  Never Used Psych involvement (Current and /or in the community):  No (Comment)  Discharge Needs  Concerns to be addressed:  No discharge needs identified Readmission within the last 30 days:  No Current discharge risk:  None Barriers to Discharge:  No Barriers Identified   Glendon Axe, MSW, LCSWA [phone] 06/18/2015 11:11 AM

## 2015-06-18 NOTE — H&P (View-Only) (Signed)
ELECTROPHYSIOLOGY CONSULT NOTE  Patient ID: Adriana Middleton MRN: [id number], DOB/AGE: Jul 15, 1930   Admit date: 06/16/2015 Date of Consult: 06/17/2015  Primary Physician: Geraldo Pitter, MD Primary Cardiologist: new- saw Dr. Daphane Shepherd in past Reason for Consultation: Cryptogenic stroke; recommendations regarding Implantable Loop Recorder  History of Present Illness Adriana Middleton was admitted on 06/16/2015 with acute multifocal R MCA territory ischemic stroke on 06/10/15 pt was to be admitted but left AMA.  She continued with Lt sided weakness, slurred speech and a fall.  She returned on 06/16/15.    Imaging demonstrated MRI: Multifocal acute on chronic RIGHT middle cerebral artery territory infarct .  MRA: MRA Occluded R MCA, high grade stenosis R P2, possible R ICA origin stenosis   SHE has undergone workup for stroke including echocardiogram and carotid dopplers ( which are Pending).  The patient has been monitored on telemetry which has demonstrated sinus rhythm with no arrhythmias.  Inpatient stroke work-up is to be completed with a TEE.   She has hx of MVP per pt.  No recent cardiology visits.  Occ has palpitations.  occ chest pain- "drinks a beer and goes to bed"  Echocardiogram this admission demonstrated - Left ventricle: The cavity size was normal. Systolic function was normal. The estimated ejection fraction was in the range of 55% to 60%. Wall motion was normal; there were no regional wall motion abnormalities. There was an increased relative contribution of atrial contraction to ventricular filling. Doppler parameters are consistent with abnormal left ventricular relaxation (grade 1 diastolic dysfunction). - Aortic valve: There was mild regurgitation. - Mitral valve: Calcified annulus .    Lab work is remarkable for Na 131, K+ 3.5, Cr 1.42, LDL 138;.CBC normal.  Prior to admission, the patient denies shortness of breath, dizziness, occ. palpitations, no syncope.  They  are recovering from their stroke with plans to currently go to CIR at discharge.  She has been fatigued recently.   EP has been asked to evaluate for placement of an implantable loop recorder to monitor for atrial fibrillation.  ROS is negative except as outlined above.    Past Medical History  Diagnosis Date  . DEPRESSION 07/08/2007  . CATARACTS, BILATERAL 04/18/2008  . HYPERTENSION 07/08/2007  . COPD 04/18/2008  . GERD 07/08/2007  . ARTHRITIS, LEFT HIP 04/18/2008  . 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 Colostomy      partial     Prescriptions prior to admission  Medication Sig Dispense Refill Last Dose  . aspirin EC 81 MG tablet Take 162 mg by mouth daily.   06/16/2015 at Unknown time  . atenolol-chlorthalidone (TENORETIC) 50-25 MG per tablet TAKE 1 TABLET BY MOUTH DAILY. 100 tablet 0 06/16/2015 at 830  . atorvastatin (LIPITOR) 10 MG tablet Take 10 mg by mouth daily.   06/16/2015 at Unknown time  . ibuprofen (ADVIL,MOTRIN) 200 MG tablet Take 400 mg by mouth daily as needed (pain).   06/16/2015 at 1900    Inpatient Medications:  . [START ON 06/18/2015] aspirin EC  325 mg Oral Daily  . atenolol  50 mg Oral Daily   And  . chlorthalidone  25 mg Oral Daily  . [START ON 06/18/2015] atorvastatin  20 mg Oral Daily  . heparin  5,000 Units Subcutaneous 3 times per day    Allergies:  Allergies  Allergen Reactions  . Codeine Itching  . Simvastatin Itching  History   Social History  . Marital Status: Widowed    Spouse Name: N/A  . Number of Children: N/A  . Years of Education: N/A   Occupational History  . Not on file.   Social History Main Topics  . Smoking status: Former Games developer  . Smokeless tobacco: Never Used     Comment: quit 15 yrs ago.    . Alcohol Use: No  . Drug Use: Not on file  . Sexual Activity: Not on file   Other Topics Concern  . Not on file   Social History  Narrative     Family History  Problem Relation Age of Onset  . Diabetes Other   . Hypertension Other   . Cancer Other     prostate  . Heart disease Other      Physical Exam: Filed Vitals:   06/17/15 0300 06/17/15 0500 06/17/15 0958 06/17/15 1432  BP: 158/52 141/63 150/62 126/59  Pulse: 52 47 54 62  Temp: 97.9 F (36.6 C) 98.1 F (36.7 C) 98.6 F (37 C) 98.7 F (37.1 C)  TempSrc: Oral Oral Oral Oral  Resp: Height:      Weight:      SpO2: 97% 100% 100% 100%    GEN- The patient is well appearing, alert and oriented x 3 today.   Head- normocephalic, atraumatic Eyes-  Sclera clear, conjunctiva pink Ears- hearing intact Oropharynx- clear Neck- supple, Lungs- Clear to ausculation bilaterally, normal work of breathing Heart- Regular rate and rhythm, no murmurs, rubs or gallops, PMI not laterally displaced GI- soft, NT, ND, + BS Extremities- no clubbing, cyanosis, or edema MS- no significant deformity or atrophy Skin- no rash or lesion Psych- euthymic mood, full affect   Labs:   Lab Results  Component Value Date   WBC 5.0 06/17/2015   HGB 13.2 06/17/2015   HCT 38.4 06/17/2015   MCV 92.5 06/17/2015   PLT 174 06/17/2015    Recent Labs Lab 06/17/15 0915  NA 131*  K 3.5  CL 95*  CO2 26  BUN 18  CREATININE 1.42*  CALCIUM 11.6*  GLUCOSE 103*   No results found for: CKTOTAL, CKMB, CKMBINDEX, TROPONINI Lab Results  Component Value Date   CHOL 209* 06/17/2015   CHOL 260* 04/11/2007   Lab Results  Component Value Date   HDL 52 06/17/2015   HDL 49.2 04/11/2007   Lab Results  Component Value Date   LDLCALC 138* 06/17/2015   Lab Results  Component Value Date   TRIG 96 06/17/2015   TRIG 137 04/11/2007   Lab Results  Component Value Date   CHOLHDL 4.0 06/17/2015   CHOLHDL 5.3 CALC 04/11/2007   Lab Results  Component Value Date   LDLDIRECT 173.0 04/11/2007    No results found for: DDIMER   Radiology/Studies: Ct Head Wo  Contrast  06/10/2015   CLINICAL DATA:  Left-sided weakness.  EXAM: CT HEAD WITHOUT CONTRAST  TECHNIQUE: Contiguous axial images were obtained from the base of the skull through the vertex without intravenous contrast.  COMPARISON:  None.  FINDINGS: There is no evidence of mass effect, midline shift, or extra-axial fluid collections. There is no evidence of a space-occupying lesion or intracranial hemorrhage. There is no evidence of a cortical-based area of acute infarction. There is generalized cerebral atrophy. There is periventricular white matter low attenuation likely secondary to microangiopathy.  The ventricles and sulci are appropriate for the patient's age. The basal cisterns are patent.  Visualized  portions of the orbits are unremarkable. There is a small left ethmoid sinus osteoma. Cerebrovascular atherosclerotic calcifications are noted.  The osseous structures are unremarkable.  IMPRESSION: 1. No acute intracranial pathology. 2. Chronic microvascular disease and cerebral atrophy.   Electronically Signed   By: Elige Ko   On: 06/10/2015 14:38   Mr Brain Wo Contrast  06/17/2015   CLINICAL DATA:  LEFT-sided weakness, and balance, fall. Recent RIGHT brain infarct. History of hypertension, COPD, depression.  EXAM: MRI HEAD WITHOUT CONTRAST  MRA HEAD WITHOUT CONTRAST  TECHNIQUE: Multiplanar, multiecho pulse sequences of the brain and surrounding structures were obtained without intravenous contrast. Angiographic images of the head were obtained using MRA technique without contrast.  COMPARISON:  MRI head June 10, 2015  FINDINGS: MRI HEAD FINDINGS  Motion degraded examination.  Patchy reduced diffusion RIGHT temporal lobe/insula, RIGHT basal ganglia, RIGHT parietal lobes with slight local further propagation. Corresponding low ADC values. No susceptibility artifact to suggest hemorrhage.  Ventricles and sulci are overall normal for patient's age. Patchy supratentorial white matter T2 hyperintensities  exclusive of the aforementioned abnormality, similar. Old small LEFT cerebellar infarcts. No midline shift, mass effect or mass lesions.  No abnormal extra-axial fluid collections. Status post RIGHT ocular lens implant. Mild paranasal sinus mucosal thickening with LEFT maxillary mucosal retention cyst. Trace RIGHT mastoid effusion. No abnormal sellar expansion. No cerebellar tonsillar ectopia. No suspicious calvarial bone marrow signal.  MRA HEAD FINDINGS  Motion degraded examination.  Anterior circulation: Included view of the proximal RIGHT internal carotid artery appears attenuated. Flow related enhancement of the included cervical, petrous, cavernous and supra clinoid internal carotid arteries. Mild stenosis RIGHT supraclinoid internal carotid artery. Complete loss of flow related enhancement RIGHT middle cerebral artery at the level of the mid M1 segment. Mild stenosis LEFT M1 segment. Patent anterior communicating artery. Flow related enhancement of the anterior cerebral arteries, including more distal segments.  No high-grade stenosis, aneurysm. Moderate luminal irregularity of the LEFT middle cerebral artery, to lesser extent the anterior cerebral arteries compatible with atherosclerosis.  Posterior circulation: LEFT vertebral artery is dominant. Basilar artery is patent, with normal flow related enhancement of the main branch vessels. Flow related enhancement of the posterior cerebral arteries. Focal high-grade stenosis versus pulsation artifact RIGHT P2 segments. Moderate stenosis LEFT P2 segment. Mild luminal regularity of the LEFT vertebral artery. Moderate luminal regularity of the mid to distal posterior cerebral arteries.  No large vessel occlusion, aneurysm.  IMPRESSION: MRI HEAD: Motion degraded examination. Multifocal acute on chronic RIGHT middle cerebral artery territory infarcts, similar distribution. No hemorrhagic conversion.  Involutional changes. Moderate white matter changes compatible with  chronic small vessel ischemic disease. Fall LEFT cerebellar small infarct.  MRA HEAD: Motion degraded examination. Occluded RIGHT middle cerebral artery.  Multiple stenosis of the cerebral arteries including high-grade stenosis versus pulsation artifact RIGHT P2 segment. Moderate luminal irregularity of the intracranial vessels compatible with atherosclerosis.  Possible RIGHT internal carotid artery origin stenosis, partially characterized. Considering degree of motion, findings may be better characterized on CT angiogram of the head and neck as clinically indicated.   Electronically Signed   By: Awilda Metro M.D.   On: 06/17/2015 00:16   Mr Brain Wo Contrast  06/10/2015   CLINICAL DATA:  Left-sided facial droop. Left upper and lower extremity weakness. Altered mental status.  EXAM: MRI HEAD WITHOUT CONTRAST  TECHNIQUE: Multiplanar, multiecho pulse sequences of the brain and surrounding structures were obtained without intravenous contrast.  COMPARISON:  CT head without contrast from  the same day.  FINDINGS: Acute nonhemorrhagic infarct involves the right insular cortex, external capsule, and anterior right lentiform nucleus. The infarct extends into the right coronal radiata with tear through other punctate cortical lesions noted in the right temporal and frontal lobe.  T2 changes are associated.  Moderate generalized atrophy and white matter disease is also present.  Markedly attenuated flow is present in the cavernous right internal carotid artery. There is no definite flow in the right middle cerebral artery or at the right MCA bifurcation. Flow is present in both ACA vessels and in the posterior circulation. The left internal carotid artery demonstrates normal flow.  No acute hemorrhage or mass lesion is present. The sagittal images are significantly degraded by patient motion.  IMPRESSION: 1. Multi focal areas of acute nonhemorrhagic infarction involving the right MCA territory. These predominantly  within the right insular cortex, portions the right temporal lobe and extending superiorly in the external capsule and coronal radiata. Additional foci are present in the right frontal lobe. 2. Attenuated or occluded flow in the cavernous right internal carotid artery and right M1 segment to lease the bifurcation. 3. Moderate atrophy and white matter disease otherwise without other acute foci of infarction. These results were called by telephone at the time of interpretation on 06/10/2015 at 5:26 pm to Dr. Carmell AustriaNate Pickering , who verbally acknowledged these results.   Electronically Signed   By: Marin Robertshristopher  Mattern M.D.   On: 06/10/2015 17:30   Mr Maxine GlennMra Head/brain Wo Cm  06/17/2015   CLINICAL DATA:  LEFT-sided weakness, and balance, fall. Recent RIGHT brain infarct. History of hypertension, COPD, depression.  EXAM: MRI HEAD WITHOUT CONTRAST  MRA HEAD WITHOUT CONTRAST  TECHNIQUE: Multiplanar, multiecho pulse sequences of the brain and surrounding structures were obtained without intravenous contrast. Angiographic images of the head were obtained using MRA technique without contrast.  COMPARISON:  MRI head June 10, 2015  FINDINGS: MRI HEAD FINDINGS  Motion degraded examination.  Patchy reduced diffusion RIGHT temporal lobe/insula, RIGHT basal ganglia, RIGHT parietal lobes with slight local further propagation. Corresponding low ADC values. No susceptibility artifact to suggest hemorrhage.  Ventricles and sulci are overall normal for patient's age. Patchy supratentorial white matter T2 hyperintensities exclusive of the aforementioned abnormality, similar. Old small LEFT cerebellar infarcts. No midline shift, mass effect or mass lesions.  No abnormal extra-axial fluid collections. Status post RIGHT ocular lens implant. Mild paranasal sinus mucosal thickening with LEFT maxillary mucosal retention cyst. Trace RIGHT mastoid effusion. No abnormal sellar expansion. No cerebellar tonsillar ectopia. No suspicious calvarial bone  marrow signal.  MRA HEAD FINDINGS  Motion degraded examination.  Anterior circulation: Included view of the proximal RIGHT internal carotid artery appears attenuated. Flow related enhancement of the included cervical, petrous, cavernous and supra clinoid internal carotid arteries. Mild stenosis RIGHT supraclinoid internal carotid artery. Complete loss of flow related enhancement RIGHT middle cerebral artery at the level of the mid M1 segment. Mild stenosis LEFT M1 segment. Patent anterior communicating artery. Flow related enhancement of the anterior cerebral arteries, including more distal segments.  No high-grade stenosis, aneurysm. Moderate luminal irregularity of the LEFT middle cerebral artery, to lesser extent the anterior cerebral arteries compatible with atherosclerosis.  Posterior circulation: LEFT vertebral artery is dominant. Basilar artery is patent, with normal flow related enhancement of the main branch vessels. Flow related enhancement of the posterior cerebral arteries. Focal high-grade stenosis versus pulsation artifact RIGHT P2 segments. Moderate stenosis LEFT P2 segment. Mild luminal regularity of the LEFT vertebral artery. Moderate  luminal regularity of the mid to distal posterior cerebral arteries.  No large vessel occlusion, aneurysm.  IMPRESSION: MRI HEAD: Motion degraded examination. Multifocal acute on chronic RIGHT middle cerebral artery territory infarcts, similar distribution. No hemorrhagic conversion.  Involutional changes. Moderate white matter changes compatible with chronic small vessel ischemic disease. Fall LEFT cerebellar small infarct.  MRA HEAD: Motion degraded examination. Occluded RIGHT middle cerebral artery.  Multiple stenosis of the cerebral arteries including high-grade stenosis versus pulsation artifact RIGHT P2 segment. Moderate luminal irregularity of the intracranial vessels compatible with atherosclerosis.  Possible RIGHT internal carotid artery origin stenosis,  partially characterized. Considering degree of motion, findings may be better characterized on CT angiogram of the head and neck as clinically indicated.   Electronically Signed   By: Awilda Metro M.D.   On: 06/17/2015 00:16    12-lead ECG Sinus Brady at 54 Nonspecific T abnrm, anterolateral leads Prolonged QT interval Nonspecific ST and T wave abnormality All prior EKG's in [facility] reviewed with no documented atrial fibrillation  Telemetry SR- SB  Assessment and Plan:  1. Cryptogenic stroke The patient presents with cryptogenic stroke.  The patient has a TEE planned for tomorrow AM.  I spoke at length with the patient about monitoring for afib with either a 30 day event monitor or an implantable loop recorder.  Risks, benefits, and alteratives to implantable loop recorder were discussed with the patient today.   At this time, the patient is very clear in their decision to proceed with implantable loop recorder.   Wound care was reviewed with the patient (keep incision clean and dry for 3 days).  Wound check scheduled for the office will call with date and time.  Please call with questions.  Nada Boozer, FNP-C At [facility]  Pgr:[phone] or after 5pm and on weekends call [phone] 06/17/2015.now     06/17/2015 4:46 PM  EP Attending  Patient seen and examined. I have reviewed the findings of Nada Boozer and concur. The patient has undergone TEE which did not demonstrate a cause of her stroke. We will plan to proceed with ILR insertion.  Leonia Reeves.D.

## 2015-06-18 NOTE — Progress Notes (Signed)
STROKE TEAM PROGRESS NOTE   HISTORY Adriana Middleton is an 81 y.o. female with a past medical history significant for HTN , MVP, depression, COPD, brought in by family for further evaluation of left sided weakness, imbalance, fall, recent right brain infarct. Patient was seen in this ED on 7/11 with complains of left face weakness, slurred speech, and left sided weakness (LKW 06/09/2015, time unknown). MRI brain demonstrated multifocal areas of acute right MCA distribution infarcts as well as findings suggestive of markedly attenuated flow in the cavernous right internal carotid artery and no definite flow in the right middle cerebral artery or at the right MCA bifurcation. Patient refused to be admitted to the hospital and left against medical advice. She has been taking aspirin. Adriana Middleton returns to the ED today 06/16/2015 complaining of poor balance needing to use a walker in the house, fall at least x 1 time, and increasing weakness. She states that she cannot tolerate the weakness anymore and needs something to be done. She complains of left-sided weakness. Daughter at the bedside said that her speech is actually improved. Denies HA, vertigo, double vision, difficulty swallowing, confusion, slurred speech, language or vision impairment. Serologies significant for Na 31, K 3.2, Cr 1.45. UA without obvious infection.  At baseline highly functional able to cook clean and do all of her ADLs and was still driving until 3 weeks ago. She lives with her daughter and has done so for the past 5 years.  Patient was not administered TPA secondary to late presentation. She was admitted for further evaluation and treatment.   SUBJECTIVE (INTERVAL HISTORY) Daughter at bedside. Patient does not want to stay in the hospital for rehab; is agreeable to SNF for rehab short-term.   OBJECTIVE Temp:  [97.9 F (36.6 C)-98.7 F (37.1 C)] 98.7 F (37.1 C) (07/19 1009) Pulse Rate:  [45-62] 48 (07/19 1009) Cardiac Rhythm:  [-]  Sinus bradycardia (07/18 2100) Resp:  [16-18] 18 (07/19 1009) BP: (110-163)/(57-78) 133/58 mmHg (07/19 1009) SpO2:  [98 %-100 %] 100 % (07/19 1009)  No results for input(s): GLUCAP in the last 168 hours.  Recent Labs Lab 06/16/15 2130 06/17/15 0915  NA 131* 131*  K 3.2* 3.5  CL 93* 95*  CO2 31 26  GLUCOSE 118* 103*  BUN 21* 18  CREATININE 1.45* 1.42*  CALCIUM 11.5* 11.6*   No results for input(s): AST, ALT, ALKPHOS, BILITOT, PROT, ALBUMIN in the last 168 hours.  Recent Labs Lab 06/16/15 2130 06/17/15 0915  WBC 6.5 5.0  NEUTROABS 4.4  --   HGB 12.9 13.2  HCT 36.9 38.4  MCV 92.0 92.5  PLT 195 174   No results for input(s): CKTOTAL, CKMB, CKMBINDEX, TROPONINI in the last 168 hours. No results for input(s): LABPROT, INR in the last 72 hours.  Recent Labs  06/16/15 2118  COLORURINE YELLOW  LABSPEC 1.007  PHURINE 5.0  GLUCOSEU NEGATIVE  HGBUR NEGATIVE  BILIRUBINUR NEGATIVE  KETONESUR NEGATIVE  PROTEINUR NEGATIVE  UROBILINOGEN 1.0  NITRITE NEGATIVE  LEUKOCYTESUR TRACE*       Component Value Date/Time   CHOL 209* 06/17/2015 0606   TRIG 96 06/17/2015 0606   HDL 52 06/17/2015 0606   CHOLHDL 4.0 06/17/2015 0606   VLDL 19 06/17/2015 0606   LDLCALC 138* 06/17/2015 0606   Lab Results  Component Value Date   HGBA1C 5.6 06/17/2015   No results found for: LABOPIA, COCAINSCRNUR, LABBENZ, AMPHETMU, THCU, LABBARB  No results for input(s): ETH in the last 168 hours.  MRI HEAD 06/17/2015    Motion degraded examination. Multifocal acute on chronic RIGHT middle cerebral artery territory infarcts, similar distribution. No hemorrhagic conversion.  Involutional changes. Moderate white matter changes compatible with chronic small vessel ischemic disease. Fall LEFT cerebellar small infarct.    MRA HEAD 06/17/2015    Motion degraded examination. Occluded RIGHT middle cerebral artery.  Multiple stenosis of the cerebral arteries including high-grade stenosis versus pulsation  artifact RIGHT P2 segment. Moderate luminal irregularity of the intracranial vessels compatible with atherosclerosis.  Possible RIGHT internal carotid artery origin stenosis, partially characterized. Considering degree of motion, findings may be better characterized on CT angiogram of the head and neck as clinically indicated.   Carotid Doppler  40-59% ICA stenosis bilaterally, based on systolic velocity and ratio. However, based on diastolic velocity there is 1-39% stenosis.  Ct Angio Neck W/cm &/or Wo/cm 06/18/2015     Calcific atherosclerosis of the aortic arch with intimal hematoma resulting in moderate stenosis LEFT subclavian artery origin.  Atherosclerosis resulting in 50% stenosis of bilateral internal carotid artery origins.  High-grade stenosis/occlusion RIGHT vertebral artery origin with high-grade stenosis LEFT vertebral artery origin.  No large vessel occlusion.     2D echo  - Left ventricle: The cavity size was normal. Systolic function wasnormal. The estimated ejection fraction was in the range of 55%to 60%. Wall motion was normal; there were no regional wallmotion abnormalities. There was an increased relativecontribution of atrial contraction to ventricular filling.Doppler parameters are consistent with abnormal left ventricularrelaxation (grade 1 diastolic dysfunction). - Aortic valve: There was mild regurgitation. - Mitral valve: Calcified annulus.  TEE 1. No LAA thromubus 2. Type 1R atrial septal aneurysm, but no PFO by saline microbubble contrast (even with provocative maneuvers) 3. Dilated LAA 4. Mild LVH 5. LVEF 55-60%, normal wall motion 6. Grade 3 atheroma of the distal ascending aorta, aortic arch and proximal descending aorta (possible source of embolism)   PHYSICAL EXAM  Temp:  [97.9 F (36.6 C)-98.7 F (37.1 C)] 98.7 F (37.1 C) (07/19 1009) Pulse Rate:  [45-62] 48 (07/19 1009) Resp:  [16-18] 18 (07/19 1009) BP: (110-163)/(57-78) 133/58 mmHg (07/19  1009) SpO2:  [98 %-100 %] 100 % (07/19 1009)  General - Well nourished, well developed, in no apparent distress but seems depressed.  Ophthalmologic - Fundi not visualized due to noncorporation.  Cardiovascular - Regular rate and rhythm with no murmur.  Mental Status -  Level of arousal and orientation to month, place were intact, but not to year and person. Language including expression, naming, repetition, comprehension was assessed and found intact. Fund of Knowledge was assessed and was impaired.  Cranial Nerves II - XII - II - Visual field intact OU. III, IV, VI - Extraocular movements intact. V - Facial sensation intact bilaterally. VII - Facial movement showed left facial droop. VIII - Hearing & vestibular intact bilaterally. X - Palate elevates symmetrically. XI - Chin turning & shoulder shrug intact bilaterally. XII - Tongue protrusion intact.  Motor Strength - The patient's strength was 4/5 LUE and 5-/5 LLE, RUE and RLE 5/5 and pronator drift was present on the left.  Bulk was normal and fasciculations were absent.   Motor Tone - Muscle tone was assessed at the neck and appendages and was normal.  Reflexes - The patient's reflexes were 1+ in all extremities and she had no pathological reflexes.  Sensory - Light touch, temperature/pinprick were assessed and were symmetrical.    Coordination - The patient had normal movements in the  hands and feet with no ataxia or dysmetria.  Tremor was absent.  Gait and Station - not tested due to safety concerns.   ASSESSMENT/PLAN Ms. DANEKA LANTIGUA is a 81 y.o. female with history of HTN , MVP, depression, COPD presenting with left sided weakness, imbalance, fall, recent right brain infarct. She did not receive IV t-PA due to late presentation.   Stroke:  Acute and subacute right MCA territory infarcts, embolic secondary to unknown source, suspicious for heavy aortic atheroma vs. atrial fibrillation   Resultant  Left  hemiparesis  MRI  Acute and subacute right MCA territory infarcts  MRA  Occluded R MCA, high grade stenosis R P2  Carotid Doppler  bilat 40-59% stenosis  CTA neck 50% stenosis of bilateral internal carotid artery origins.  High-grade stenosis/occlusion RIGHT vertebral artery origin with high-grade stenosis LEFT vertebral artery origin.  No large vessel occlusion.     2D Echo  No source of embolus   TEE showed ASA but no PFO but heavy aortic atheroma grade III   Loop recorder placed  LDL 138  HgbA1c 5.6  Heparin 5000 units sq tid for VTE prophylaxis Diet NPO time specified  aspirin 81 mg orally every day and aspirin 162 mg daily prior to admission, increased to aspirin 325 mg orally every day. Due to heavy aortic atheroma, recommend dural antiplatelet for 3 months and then plavix alone.  Patient counseled to be compliant with her antithrombotic medications  Ongoing aggressive stroke risk factor management  Therapy recommendations:  CIR. Consult pending.  Disposition:  pending . Pt prefers short-term SNF placement as [facility] is near her daughter's home  Hypertension  Permissive hypertension (OK if < 220/120) but gradually normalize in 5-7 days  Given R MCA occlusion, recommend SBP goal 130-150.  Hyperlipidemia  Home meds:  lipitor 10, resumed in hospital.  LDL 138, goal < 70  Increased lipitor to 20 mg daily  Continue statin at discharge  Other Stroke Risk Factors  Advanced age  Hx stroke/TIA - 1 week ago (06/10/2015) for which she refused admission.  Other Active Problems  CKD  MVP  Hospital day # 2  Rhoderick [facility] See Amion for Pager information 06/18/2015 10:56 AM   I, the attending vascular neurologist, have personally obtained a history, examined the patient, evaluated laboratory data, individually viewed imaging studies and agree with radiology interpretations. I also discussed with pt and her daughter and Dr. Benjamine Mola regarding  her care plan. Together with the NP/PA, we formulated the assessment and plan of care which reflects our mutual decision.  I have made any additions or clarifications directly to the above note and agree with the findings and plan as currently documented.   81 yo F with hx of HTN, MVP, depression on home ASA was admitted for right MCA territory infarcts, more extension and multifocal than last week. MRA showed right MCA cut off. TEE showed ASA but no PFO but heavy aortic atheroma. Stroke etiology suspicious for afib vs. Aortic atheroma. CTA head and neck showed 50% bilateral ICA stenosis. Loop recorder placed. Due to aortic atheroma, will recommend dural antiplatelet for 3 months and then plavix alone. Pending SNF. Following up in 1 month.   Neurology will sign off. Please call with questions. Pt will follow up with Dr. Roda Shutters at [facility] in about 1 month. Thanks for the consult.  Marvel Plan, MD PhD Stroke Neurology 06/18/2015 4:09 PM      To contact Stroke Continuity provider,  please refer to [email or web address] After hours, contact General Neurology

## 2015-06-18 NOTE — Evaluation (Signed)
Occupational Therapy Evaluation Patient Details Name: Adriana Middleton MRN: [id number] DOB: [birth year] Today's Date: 06/18/2015    History of Present Illness  81 y.o. female diagnosed with acute multifocal R MCA territory ischemic stroke on 06/10/15 when she presented to the ED at that time. Attempt was made by EDP to admit the patient; however, patient elected to leave AMA instead. Pt with con't L sided weakness and then had a fall.   Clinical Impression   Pt incontinent sitting EOB asking to go to bathroom upon OT arrival. Pt with noted confusion, L side weakness and L side inattention/ neglect. No family present to determine PLOF or to verify home set up. Pt would benefit from CIR upon d/c to increase independence and safety with ADLs prior to d/c home.     Follow Up Recommendations  CIR    Equipment Recommendations  Other (comment) (TBD next venue)    Recommendations for Other Services       Precautions / Restrictions Precautions Precautions: Fall Restrictions Weight Bearing Restrictions: No      Mobility Bed Mobility               General bed mobility comments: Pt sitting EOB upon OT arrival  Transfers Overall transfer level: Needs assistance Equipment used: Rolling walker (2 wheeled) Transfers: Sit to/from Stand Sit to Stand: Min assist         General transfer comment: VC's for hand placement and walker safety    Balance Overall balance assessment: Needs assistance Sitting-balance support: Feet supported Sitting balance-Leahy Scale: Fair     Standing balance support: Single extremity supported;During functional activity Standing balance-Leahy Scale: Poor Standing balance comment: Pt leaning against sink to wash hands                            ADL Overall ADL's : Needs assistance/impaired Eating/Feeding: NPO   Grooming: Wash/dry hands;Wash/dry face;Min guard;Standing   Upper Body Bathing: Minimal assitance;Sitting   Lower Body  Bathing: Maximal assistance;Sit to/from stand   Upper Body Dressing : Minimal assistance;Sitting   Lower Body Dressing: Maximal assistance;Sit to/from stand Lower Body Dressing Details (indicate cue type and reason): When donning socks, Pt able to lift leg and point foot to assist; Pt unable to reach feet to don socks Toilet Transfer: Minimal assistance;Cueing for safety;Ambulation;Regular Toilet;RW Toilet Transfer Details (indicate cue type and reason): VC's for positioning on toilet Toileting- Clothing Manipulation and Hygiene: Min guard;Sit to/from Nurse, children'sstand     Tub/Shower Transfer Details (indicate cue type and reason): Pt reports PLOF sitting on edge of tub and swinging legs into tub  Functional mobility during ADLs: Minimal assistance;Rolling walker;Cueing for safety (VC's to place L foot on floor) General ADL Comments: Pt able to follow 1 step commands with increased time; Pt incontinent sitting EOB upon OT arrival     Vision Vision Assessment?: Vision impaired- to be further tested in functional context Additional Comments: assessment limited by cognition; Pt appears to neglect items on L side; able to find objects on L with VC's   Perception     Praxis      Pertinent Vitals/Pain Pain Assessment: No/denies pain     Hand Dominance Right   Extremity/Trunk Assessment Upper Extremity Assessment Upper Extremity Assessment: LUE deficits/detail LUE Deficits / Details: 4-/5 MMT, decreased grip strength LUE:  (difficult to assess due to cognition) LUE Sensation: decreased light touch   Lower Extremity Assessment Lower Extremity Assessment: Defer to PT evaluation;LLE  deficits/detail LLE Deficits / Details: VC's to place L foot on floor   Cervical / Trunk Assessment Cervical / Trunk Assessment: Normal   Communication Communication Communication: No difficulties   Cognition Arousal/Alertness: Awake/alert Behavior During Therapy: WFL for tasks assessed/performed Overall  Cognitive Status: No family/caregiver present to determine baseline cognitive functioning Area of Impairment: Orientation;Safety/judgement;Awareness;Problem solving;Following commands;Attention Orientation Level: Disoriented to;Time;Situation Current Attention Level: Sustained Memory: Decreased short-term memory Following Commands: Follows one step commands with increased time Safety/Judgement: Decreased awareness of safety;Decreased awareness of deficits Awareness: Intellectual Problem Solving: Slow processing;Difficulty sequencing;Requires verbal cues;Requires tactile cues General Comments: Pt reports it is 2005, and that she needs "to go home and get the rats out of her garden". When asked who she would call for help at home she says 911.   General Comments       Exercises       Shoulder Instructions      Home Living Family/patient expects to be discharged to:: Private residence Living Arrangements: Children (daughter) Available Help at Discharge: Family;Available PRN/intermittently Type of Home: House Home Access: Stairs to enter [employer] of Steps: 4-5   Home Layout: One level     Bathroom Shower/Tub: Tub/shower unit Shower/tub characteristics: Curtain Firefighter: Standard     Home Equipment: Environmental consultant - 2 wheels;Cane - single point;Shower seat;Wheelchair - manual   Additional Comments: unsure of accuracy of PLOF and home set up due to confusion and no family present      Prior Functioning/Environment Level of Independence: Independent with assistive device(s)        Comments: Pt reports using cane, walker or w/c depending on the day    OT Diagnosis: Generalized weakness;Disturbance of vision;Cognitive deficits   OT Problem List: Decreased strength;Decreased activity tolerance;Impaired balance (sitting and/or standing);Impaired vision/perception;Decreased cognition;Decreased safety awareness;Decreased knowledge of use of DME or AE   OT  Treatment/Interventions: Self-care/ADL training;Therapeutic exercise;DME and/or AE instruction;Therapeutic activities;Visual/perceptual remediation/compensation;Patient/family education;Balance training    OT Goals(Current goals can be found in the care plan section) Acute Rehab OT Goals Patient Stated Goal: to go home OT Goal Formulation: With patient Time For Goal Achievement: 07/02/15 Potential to Achieve Goals: Good ADL Goals Pt Will Perform Grooming: with supervision;standing Pt Will Perform Upper Body Bathing: with supervision;sitting Pt Will Perform Lower Body Bathing: with mod assist;sit to/from stand Additional ADL Goal #1: Pt will attend to objects on L side during ADLs with min VCs.  OT Frequency: Min 3X/week   Barriers to D/C:            Co-evaluation              End of Session Equipment Utilized During Treatment: Gait belt;Rolling walker Nurse Communication: Mobility status  Activity Tolerance: Patient tolerated treatment well Patient left: in chair;with call bell/phone within reach;with chair alarm set   Time: [phone] OT Time Calculation (min): 21 min Charges:  OT General Charges $OT Visit: 1 Procedure OT Evaluation $Initial OT Evaluation Tier I: 1 Procedure G-Codes:    Marden Noble 06/18/2015, 9:30 AM

## 2015-06-18 NOTE — Care Management Important Message (Signed)
Important Message  Patient Details  Name: Adriana Middleton MRN: [id number] Date of Birth: [birth year]   Medicare Important Message Given:  Yes-second notification given    [name] 06/18/2015, 11:51 AM

## 2015-06-18 NOTE — Clinical Social Work Note (Signed)
Patient declining CIR/IP REHAB and [facility]. CSW has notified IP REHAB Admissions Coordinator, Campbell LernerJanine.   FL-2 completed and signed.   CSW will continue to follow patient and pt's family for continued support and to facilitate pt's discharge needs once medically stable.   Derenda FennelBashira Yaretsi Humphres, MSW, LCSWA [phone] 06/18/2015 1:21 PM

## 2015-06-18 NOTE — Progress Notes (Signed)
PROGRESS NOTE  Adriana Middleton ZOX:[id number] DOB: [birth year] DOA: 06/16/2015 PCP: Geraldo Pitter, MD  Assessment/Plan: Acute R MCA ischemic stroke - Now subacute 1. MRI: + for CVA 2. Neurology consulted 3. ASA 4. TEE 7/19 5. Patient wants SNF not CIR  HTN - continue home meds  CKD -repeat BMP today   Code Status: full Family Communication: patient Disposition Plan: SNF pending   Consultants:  neuro  Procedures:      HPI/Subjective: Not happy with still being in the hospital  Objective: Filed Vitals:   06/18/15 1331  BP: 167/68  Pulse: 48  Temp: 97.6 F (36.4 C)  Resp: 19   No intake or output data in the 24 hours ending 06/18/15 1334 Filed Weights   06/16/15 2104 06/17/15 0024 06/18/15 1331  Weight: 61.689 kg (136 lb) 71.5 kg (157 lb 10.1 oz) 71.215 kg (157 lb)    Exam:   General:  Awake, NAD  Cardiovascular: rrr  Respiratory: clear  Abdomen: +BS, soft  Musculoskeletal: uncooperative  Data Reviewed: Basic Metabolic Panel:  Recent Labs Lab 06/16/15 2130 06/17/15 0915  NA 131* 131*  K 3.2* 3.5  CL 93* 95*  CO2 31 26  GLUCOSE 118* 103*  BUN 21* 18  CREATININE 1.45* 1.42*  CALCIUM 11.5* 11.6*   Liver Function Tests: No results for input(s): AST, ALT, ALKPHOS, BILITOT, PROT, ALBUMIN in the last 168 hours. No results for input(s): LIPASE, AMYLASE in the last 168 hours. No results for input(s): AMMONIA in the last 168 hours. CBC:  Recent Labs Lab 06/16/15 2130 06/17/15 0915  WBC 6.5 5.0  NEUTROABS 4.4  --   HGB 12.9 13.2  HCT 36.9 38.4  MCV 92.0 92.5  PLT 195 174   Cardiac Enzymes: No results for input(s): CKTOTAL, CKMB, CKMBINDEX, TROPONINI in the last 168 hours. BNP (last 3 results) No results for input(s): BNP in the last 8760 hours.  ProBNP (last 3 results) No results for input(s): PROBNP in the last 8760 hours.  CBG: No results for input(s): GLUCAP in the last 168 hours.  No results found for this or any previous  visit (from the past 240 hour(s)).   Studies: Ct Angio Neck W/cm &/or Wo/cm  06/18/2015   CLINICAL DATA:  Carotid stenosis. History of LEFT-sided weakness, hypertension.  EXAM: CT ANGIOGRAPHY NECK  TECHNIQUE: Multidetector CT imaging of the neck was performed using the standard protocol during bolus administration of intravenous contrast. Multiplanar CT image reconstructions and MIPs were obtained to evaluate the vascular anatomy. Carotid stenosis measurements (when applicable) are obtained utilizing NASCET criteria, using the distal internal carotid diameter as the denominator.  CONTRAST:  50mL OMNIPAQUE IOHEXOL 350 MG/ML SOLN  COMPARISON:  MRA  head June 16, 2015  FINDINGS: Severe calcific atherosclerosis of the aortic arch with intimal irregularity consistent with hematoma. Two vessel aortic arch, innominate artery and LEFT Common carotid artery are widely patent with mild intimal thickening. Calcific atherosclerosis of LEFT subclavian artery origin resulting in at least moderate stenosis though, pulsation artifact limits assessment. Origin of the RIGHT Common carotid artery is widely patent.  Bilateral Common carotid arteries are widely patent, coursing in a straight line fashion. Severe calcific atherosclerosis the LEFT carotid bulb resulting in 50% LEFT internal carotid artery stenosis by NASCET criteria. Calcific atherosclerosis in intimal thickening of the RIGHT carotid bulb resulting in 50% stenosis.  Calcific atherosclerosis of the LEFT vertebral artery origin resultant high-grade stenosis. High-grade stenosis RIGHT vertebral artery origin, with poststenotic dilatation.  No dissection, no  pseudoaneurysm. No suspicious luminal irregularity. No contrast extravasation.  Soft tissues are nonsuspicious. No acute osseous process though bone windows have not been submitted. No destructive bony lesions. Moderate degenerative change of the cervical spine with grade 1 C3-4, grade 1 C4-5 anterolisthesis on  degenerative basis.  IMPRESSION: Calcific atherosclerosis of the aortic arch with intimal hematoma resulting in moderate stenosis LEFT subclavian artery origin.  Atherosclerosis resulting in 50% stenosis of bilateral internal carotid artery origins.  High-grade stenosis/occlusion RIGHT vertebral artery origin with high-grade stenosis LEFT vertebral artery origin.  No large vessel occlusion.   Electronically Signed   By: Awilda Metro M.D.   On: 06/18/2015 00:10   Mr Brain Wo Contrast  06/17/2015   CLINICAL DATA:  LEFT-sided weakness, and balance, fall. Recent RIGHT brain infarct. History of hypertension, COPD, depression.  EXAM: MRI HEAD WITHOUT CONTRAST  MRA HEAD WITHOUT CONTRAST  TECHNIQUE: Multiplanar, multiecho pulse sequences of the brain and surrounding structures were obtained without intravenous contrast. Angiographic images of the head were obtained using MRA technique without contrast.  COMPARISON:  MRI head June 10, 2015  FINDINGS: MRI HEAD FINDINGS  Motion degraded examination.  Patchy reduced diffusion RIGHT temporal lobe/insula, RIGHT basal ganglia, RIGHT parietal lobes with slight local further propagation. Corresponding low ADC values. No susceptibility artifact to suggest hemorrhage.  Ventricles and sulci are overall normal for patient's age. Patchy supratentorial white matter T2 hyperintensities exclusive of the aforementioned abnormality, similar. Old small LEFT cerebellar infarcts. No midline shift, mass effect or mass lesions.  No abnormal extra-axial fluid collections. Status post RIGHT ocular lens implant. Mild paranasal sinus mucosal thickening with LEFT maxillary mucosal retention cyst. Trace RIGHT mastoid effusion. No abnormal sellar expansion. No cerebellar tonsillar ectopia. No suspicious calvarial bone marrow signal.  MRA HEAD FINDINGS  Motion degraded examination.  Anterior circulation: Included view of the proximal RIGHT internal carotid artery appears attenuated. Flow related  enhancement of the included cervical, petrous, cavernous and supra clinoid internal carotid arteries. Mild stenosis RIGHT supraclinoid internal carotid artery. Complete loss of flow related enhancement RIGHT middle cerebral artery at the level of the mid M1 segment. Mild stenosis LEFT M1 segment. Patent anterior communicating artery. Flow related enhancement of the anterior cerebral arteries, including more distal segments.  No high-grade stenosis, aneurysm. Moderate luminal irregularity of the LEFT middle cerebral artery, to lesser extent the anterior cerebral arteries compatible with atherosclerosis.  Posterior circulation: LEFT vertebral artery is dominant. Basilar artery is patent, with normal flow related enhancement of the main branch vessels. Flow related enhancement of the posterior cerebral arteries. Focal high-grade stenosis versus pulsation artifact RIGHT P2 segments. Moderate stenosis LEFT P2 segment. Mild luminal regularity of the LEFT vertebral artery. Moderate luminal regularity of the mid to distal posterior cerebral arteries.  No large vessel occlusion, aneurysm.  IMPRESSION: MRI HEAD: Motion degraded examination. Multifocal acute on chronic RIGHT middle cerebral artery territory infarcts, similar distribution. No hemorrhagic conversion.  Involutional changes. Moderate white matter changes compatible with chronic small vessel ischemic disease. Fall LEFT cerebellar small infarct.  MRA HEAD: Motion degraded examination. Occluded RIGHT middle cerebral artery.  Multiple stenosis of the cerebral arteries including high-grade stenosis versus pulsation artifact RIGHT P2 segment. Moderate luminal irregularity of the intracranial vessels compatible with atherosclerosis.  Possible RIGHT internal carotid artery origin stenosis, partially characterized. Considering degree of motion, findings may be better characterized on CT angiogram of the head and neck as clinically indicated.   Electronically Signed   By:  Michel Santee.D.  On: 06/17/2015 00:16   Mr Maxine Glenn Head/brain Wo Cm  06/17/2015   CLINICAL DATA:  LEFT-sided weakness, and balance, fall. Recent RIGHT brain infarct. History of hypertension, COPD, depression.  EXAM: MRI HEAD WITHOUT CONTRAST  MRA HEAD WITHOUT CONTRAST  TECHNIQUE: Multiplanar, multiecho pulse sequences of the brain and surrounding structures were obtained without intravenous contrast. Angiographic images of the head were obtained using MRA technique without contrast.  COMPARISON:  MRI head June 10, 2015  FINDINGS: MRI HEAD FINDINGS  Motion degraded examination.  Patchy reduced diffusion RIGHT temporal lobe/insula, RIGHT basal ganglia, RIGHT parietal lobes with slight local further propagation. Corresponding low ADC values. No susceptibility artifact to suggest hemorrhage.  Ventricles and sulci are overall normal for patient's age. Patchy supratentorial white matter T2 hyperintensities exclusive of the aforementioned abnormality, similar. Old small LEFT cerebellar infarcts. No midline shift, mass effect or mass lesions.  No abnormal extra-axial fluid collections. Status post RIGHT ocular lens implant. Mild paranasal sinus mucosal thickening with LEFT maxillary mucosal retention cyst. Trace RIGHT mastoid effusion. No abnormal sellar expansion. No cerebellar tonsillar ectopia. No suspicious calvarial bone marrow signal.  MRA HEAD FINDINGS  Motion degraded examination.  Anterior circulation: Included view of the proximal RIGHT internal carotid artery appears attenuated. Flow related enhancement of the included cervical, petrous, cavernous and supra clinoid internal carotid arteries. Mild stenosis RIGHT supraclinoid internal carotid artery. Complete loss of flow related enhancement RIGHT middle cerebral artery at the level of the mid M1 segment. Mild stenosis LEFT M1 segment. Patent anterior communicating artery. Flow related enhancement of the anterior cerebral arteries, including more distal  segments.  No high-grade stenosis, aneurysm. Moderate luminal irregularity of the LEFT middle cerebral artery, to lesser extent the anterior cerebral arteries compatible with atherosclerosis.  Posterior circulation: LEFT vertebral artery is dominant. Basilar artery is patent, with normal flow related enhancement of the main branch vessels. Flow related enhancement of the posterior cerebral arteries. Focal high-grade stenosis versus pulsation artifact RIGHT P2 segments. Moderate stenosis LEFT P2 segment. Mild luminal regularity of the LEFT vertebral artery. Moderate luminal regularity of the mid to distal posterior cerebral arteries.  No large vessel occlusion, aneurysm.  IMPRESSION: MRI HEAD: Motion degraded examination. Multifocal acute on chronic RIGHT middle cerebral artery territory infarcts, similar distribution. No hemorrhagic conversion.  Involutional changes. Moderate white matter changes compatible with chronic small vessel ischemic disease. Fall LEFT cerebellar small infarct.  MRA HEAD: Motion degraded examination. Occluded RIGHT middle cerebral artery.  Multiple stenosis of the cerebral arteries including high-grade stenosis versus pulsation artifact RIGHT P2 segment. Moderate luminal irregularity of the intracranial vessels compatible with atherosclerosis.  Possible RIGHT internal carotid artery origin stenosis, partially characterized. Considering degree of motion, findings may be better characterized on CT angiogram of the head and neck as clinically indicated.   Electronically Signed   By: Awilda Metro M.D.   On: 06/17/2015 00:16    Scheduled Meds: . aspirin EC  325 mg Oral Daily  . atenolol  50 mg Oral Daily   And  . chlorthalidone  25 mg Oral Daily  . atorvastatin  20 mg Oral Daily  . heparin  5,000 Units Subcutaneous 3 times per day   Continuous Infusions: . sodium chloride     Antibiotics Given (last 72 hours)    None      Principal Problem:   Acute right arterial ischemic  stroke, MCA (middle cerebral artery) Active Problems:   Essential hypertension   Left-sided weakness   Carotid stenosis   Stroke  HLD (hyperlipidemia)    Time spent: 25 min    [facility] Hospital[facility]ANN, Timiya Howells  [facility] Pager [phone]. If 7PM-7AM, please contact night-coverage at [email or web address], password [email or web address] 06/18/2015, 1:34 PM  LOS: 2 days

## 2015-06-18 NOTE — Progress Notes (Signed)
Rehab Admissions Coordinator Note:  Patient was screened by Carely Nappier L for appropriateness for an Inpatient Acute Rehab Consult.  At this time, we are recommending Inpatient Rehab consult. I will contact Dr. Benjamine MolaVann for rehab consult order.  Kynesha Guerin L 06/18/2015, 8:58 AM  I can be reached at [phone].

## 2015-06-18 NOTE — Progress Notes (Signed)
  Echocardiogram Echocardiogram Transesophageal has been performed.  Adriana Middleton, Adriana Middleton 06/18/2015, 2:29 PM

## 2015-06-18 NOTE — Progress Notes (Signed)
Rehab admissions - I met with pt's dtr and grandson today in follow up to rehab MD consult. I had received an earlier call from [place], Education officer, museum, explaining that pt and family prefer SNF closer to their home.  Pt was gone for a procedure but pt's family did confirm these plans and their preference for SNF in their neighborhood. They had no further questions or concerns. I will now sign off pt's case. Thanks.  Nanetta Batty, PT Rehabilitation Admissions Coordinator [phone]

## 2015-06-18 NOTE — H&P (Signed)
     INTERVAL PROCEDURE H&P  History and Physical Interval Note:  06/18/2015 1:02 PM  Adriana Middleton has presented today for their planned procedure. The various methods of treatment have been discussed with the patient and family. After consideration of risks, benefits and other options for treatment, the patient has consented to the procedure.  The patients' outpatient history has been reviewed, patient examined, and no change in status from most recent office note within the past 30 days. I have reviewed the patients' chart and labs and will proceed as planned. Questions were answered to the patient's satisfaction.   Chrystie NoseKenneth C. Hilty, MD, [facility] Attending Cardiologist [facility]  Lisette AbuKenneth C Hilty 06/18/2015, 1:02 PM

## 2015-06-19 ENCOUNTER — Encounter (HOSPITAL_COMMUNITY): Payer: Self-pay | Admitting: Internal Medicine

## 2015-06-19 DIAGNOSIS — E785 Hyperlipidemia, unspecified: Secondary | ICD-10-CM | POA: Diagnosis not present

## 2015-06-19 DIAGNOSIS — R2681 Unsteadiness on feet: Secondary | ICD-10-CM | POA: Diagnosis not present

## 2015-06-19 DIAGNOSIS — J302 Other seasonal allergic rhinitis: Secondary | ICD-10-CM | POA: Diagnosis not present

## 2015-06-19 DIAGNOSIS — I69392 Facial weakness following cerebral infarction: Secondary | ICD-10-CM | POA: Diagnosis not present

## 2015-06-19 DIAGNOSIS — I6930 Unspecified sequelae of cerebral infarction: Secondary | ICD-10-CM | POA: Diagnosis not present

## 2015-06-19 DIAGNOSIS — I6931 Cognitive deficits following cerebral infarction: Secondary | ICD-10-CM | POA: Diagnosis not present

## 2015-06-19 DIAGNOSIS — I6789 Other cerebrovascular disease: Secondary | ICD-10-CM | POA: Diagnosis not present

## 2015-06-19 DIAGNOSIS — I63511 Cerebral infarction due to unspecified occlusion or stenosis of right middle cerebral artery: Secondary | ICD-10-CM | POA: Diagnosis not present

## 2015-06-19 DIAGNOSIS — M6281 Muscle weakness (generalized): Secondary | ICD-10-CM | POA: Diagnosis not present

## 2015-06-19 DIAGNOSIS — J3489 Other specified disorders of nose and nasal sinuses: Secondary | ICD-10-CM | POA: Diagnosis not present

## 2015-06-19 DIAGNOSIS — R262 Difficulty in walking, not elsewhere classified: Secondary | ICD-10-CM | POA: Diagnosis not present

## 2015-06-19 DIAGNOSIS — I10 Essential (primary) hypertension: Secondary | ICD-10-CM | POA: Diagnosis not present

## 2015-06-19 DIAGNOSIS — I63239 Cerebral infarction due to unspecified occlusion or stenosis of unspecified carotid arteries: Secondary | ICD-10-CM | POA: Diagnosis not present

## 2015-06-19 MED ORDER — ASPIRIN 325 MG PO TBEC: 325.0000 mg | DELAYED_RELEASE_TABLET | Freq: Every day | ORAL | Status: DC

## 2015-06-19 MED ORDER — CLOPIDOGREL BISULFATE 75 MG PO TABS: 75.0000 mg | ORAL_TABLET | Freq: Every day | ORAL | Status: DC

## 2015-06-19 MED ORDER — ATORVASTATIN CALCIUM 20 MG PO TABS: 20.0000 mg | ORAL_TABLET | Freq: Every day | ORAL | Status: DC

## 2015-06-19 NOTE — Progress Notes (Signed)
Patient's incision from her loop recorder had bled through the bandage and saturated the front of her gown. Pressure held on it for about 15 minutes and a new pressure dressing applied. Will continue to monitor to closely. Prospero Mahnke, [place] ScrapeSarah E, RN

## 2015-06-19 NOTE — Progress Notes (Signed)
D/C orders received, pt for D/C SNF today.  IV and telemetry D/C.  Rx and D/C instructions given with verbalized understanding.  Family at bedside to assist with D/C, brought pt downstairs via EMS.
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
## 2015-06-19 NOTE — Discharge Summary (Signed)
Physician Discharge Summary  Adriana Middleton MWU:[id number] DOB: November 19, 1930 DOA: 06/16/2015  PCP: Geraldo Pitter, MD  Admit date: 06/16/2015 Discharge date: 06/19/2015  Time spent: 35 minutes  Recommendations for Outpatient Follow-up:  1. Asa/plavix for 3 months then just plavix  Discharge Diagnoses:  Principal Problem:   Acute right arterial ischemic stroke, MCA (middle cerebral artery) Active Problems:   Essential hypertension   Left-sided weakness   Carotid stenosis   Stroke   HLD (hyperlipidemia)   Aortic atherosclerosis   Discharge Condition: improved  Diet recommendation: heart healthy  Filed Weights   06/16/15 2104 06/17/15 0024 06/18/15 1331  Weight: 61.689 kg (136 lb) 71.5 kg (157 lb 10.1 oz) 71.215 kg (157 lb)    History of present illness:  Adriana Middleton is a 81 y.o. female diagnosed with acute multifocal R MCA territory ischemic stroke on 06/10/15 when she presented to the ED at that time. Attempt was made by EDP and our service to admit the patient; however, patient elected to leave AMA instead.  Since leaving the hospital she has had ongoing difficulty with left sided weakness, slurred-speech, memory difficulty, and fall yesterday. Symptoms havent worsened since that time but havent improved any either.  Hospital Course:  Acute R MCA ischemic stroke - Now subacute 1. MRI: + for CVA 2. Neurology consulted 3. ASA/plavix for 3 months then just plavix 4. TEE 7/19- loop recorder 5. Patient wants SNF not CIR  HTN - continue home meds  CKD stage 3   Procedures:    Consultations:  neuro  Discharge Exam: Filed Vitals:   06/19/15 0529  BP: 126/36  Pulse: 57  Temp: 98.2 F (36.8 C)  Resp: 18    General: awake. NAD Cardiovascular: rrr Respiratory: clear  Discharge Instructions   Discharge Instructions    Ambulatory referral to Neurology    Complete by:  As directed   Pt will follow up with Dr. Roda Shutters at [facility] in about one month. Thanks.     Diet -  low sodium heart healthy    Complete by:  As directed      Discharge instructions    Complete by:  As directed   ASA/plavix for 3 months then just plavix     Increase activity slowly    Complete by:  As directed           Current Discharge Medication List    START taking these medications   Details  clopidogrel (PLAVIX) 75 MG tablet Take 1 tablet (75 mg total) by mouth daily.      CONTINUE these medications which have CHANGED   Details  aspirin EC 325 MG EC tablet Take 1 tablet (325 mg total) by mouth daily. Qty: 30 tablet, Refills: 0    atorvastatin (LIPITOR) 20 MG tablet Take 1 tablet (20 mg total) by mouth daily.      CONTINUE these medications which have NOT CHANGED   Details  atenolol-chlorthalidone (TENORETIC) 50-25 MG per tablet TAKE 1 TABLET BY MOUTH DAILY. Qty: 100 tablet, Refills: 0    ibuprofen (ADVIL,MOTRIN) 200 MG tablet Take 400 mg by mouth daily as needed (pain).       Allergies  Allergen Reactions  . Codeine Itching  . Simvastatin Itching   Follow-up Information    Follow up with [facility] On 06/27/2015.   Why:  at [facility] for wound check   Contact information:   [address] [place] [address]  Follow up with Xu,Jindong, MD. Schedule an appointment as soon as possible for a visit in 1 month.   Specialty:  Neurology   Why:  stroke clinic   Contact information:   [address] [place] [address] [phone]        The results of significant diagnostics from this hospitalization (including imaging, microbiology, ancillary and laboratory) are listed below for reference.    Significant Diagnostic Studies: Ct Head Wo Contrast  06/10/2015   CLINICAL DATA:  Left-sided weakness.  EXAM: CT HEAD WITHOUT CONTRAST  TECHNIQUE: Contiguous axial images were obtained from the base of the skull through the vertex without intravenous contrast.  COMPARISON:  None.  FINDINGS: There is no evidence of  mass effect, midline shift, or extra-axial fluid collections. There is no evidence of a space-occupying lesion or intracranial hemorrhage. There is no evidence of a cortical-based area of acute infarction. There is generalized cerebral atrophy. There is periventricular white matter low attenuation likely secondary to microangiopathy.  The ventricles and sulci are appropriate for the patient's age. The basal cisterns are patent.  Visualized portions of the orbits are unremarkable. There is a small left ethmoid sinus osteoma. Cerebrovascular atherosclerotic calcifications are noted.  The osseous structures are unremarkable.  IMPRESSION: 1. No acute intracranial pathology. 2. Chronic microvascular disease and cerebral atrophy.   Electronically Signed   By: Elige Ko   On: 06/10/2015 14:38   Ct Angio Neck W/cm &/or Wo/cm  06/18/2015   CLINICAL DATA:  Carotid stenosis. History of LEFT-sided weakness, hypertension.  EXAM: CT ANGIOGRAPHY NECK  TECHNIQUE: Multidetector CT imaging of the neck was performed using the standard protocol during bolus administration of intravenous contrast. Multiplanar CT image reconstructions and MIPs were obtained to evaluate the vascular anatomy. Carotid stenosis measurements (when applicable) are obtained utilizing NASCET criteria, using the distal internal carotid diameter as the denominator.  CONTRAST:  50mL OMNIPAQUE IOHEXOL 350 MG/ML SOLN  COMPARISON:  MRA  head June 16, 2015  FINDINGS: Severe calcific atherosclerosis of the aortic arch with intimal irregularity consistent with hematoma. Two vessel aortic arch, innominate artery and LEFT Common carotid artery are widely patent with mild intimal thickening. Calcific atherosclerosis of LEFT subclavian artery origin resulting in at least moderate stenosis though, pulsation artifact limits assessment. Origin of the RIGHT Common carotid artery is widely patent.  Bilateral Common carotid arteries are widely patent, coursing in a straight  line fashion. Severe calcific atherosclerosis the LEFT carotid bulb resulting in 50% LEFT internal carotid artery stenosis by NASCET criteria. Calcific atherosclerosis in intimal thickening of the RIGHT carotid bulb resulting in 50% stenosis.  Calcific atherosclerosis of the LEFT vertebral artery origin resultant high-grade stenosis. High-grade stenosis RIGHT vertebral artery origin, with poststenotic dilatation.  No dissection, no pseudoaneurysm. No suspicious luminal irregularity. No contrast extravasation.  Soft tissues are nonsuspicious. No acute osseous process though bone windows have not been submitted. No destructive bony lesions. Moderate degenerative change of the cervical spine with grade 1 C3-4, grade 1 C4-5 anterolisthesis on degenerative basis.  IMPRESSION: Calcific atherosclerosis of the aortic arch with intimal hematoma resulting in moderate stenosis LEFT subclavian artery origin.  Atherosclerosis resulting in 50% stenosis of bilateral internal carotid artery origins.  High-grade stenosis/occlusion RIGHT vertebral artery origin with high-grade stenosis LEFT vertebral artery origin.  No large vessel occlusion.   Electronically Signed   By: Awilda Metro M.D.   On: 06/18/2015 00:10   Mr Brain Wo Contrast  06/17/2015   CLINICAL DATA:  LEFT-sided  weakness, and balance, fall. Recent RIGHT brain infarct. History of hypertension, COPD, depression.  EXAM: MRI HEAD WITHOUT CONTRAST  MRA HEAD WITHOUT CONTRAST  TECHNIQUE: Multiplanar, multiecho pulse sequences of the brain and surrounding structures were obtained without intravenous contrast. Angiographic images of the head were obtained using MRA technique without contrast.  COMPARISON:  MRI head June 10, 2015  FINDINGS: MRI HEAD FINDINGS  Motion degraded examination.  Patchy reduced diffusion RIGHT temporal lobe/insula, RIGHT basal ganglia, RIGHT parietal lobes with slight local further propagation. Corresponding low ADC values. No susceptibility  artifact to suggest hemorrhage.  Ventricles and sulci are overall normal for patient's age. Patchy supratentorial white matter T2 hyperintensities exclusive of the aforementioned abnormality, similar. Old small LEFT cerebellar infarcts. No midline shift, mass effect or mass lesions.  No abnormal extra-axial fluid collections. Status post RIGHT ocular lens implant. Mild paranasal sinus mucosal thickening with LEFT maxillary mucosal retention cyst. Trace RIGHT mastoid effusion. No abnormal sellar expansion. No cerebellar tonsillar ectopia. No suspicious calvarial bone marrow signal.  MRA HEAD FINDINGS  Motion degraded examination.  Anterior circulation: Included view of the proximal RIGHT internal carotid artery appears attenuated. Flow related enhancement of the included cervical, petrous, cavernous and supra clinoid internal carotid arteries. Mild stenosis RIGHT supraclinoid internal carotid artery. Complete loss of flow related enhancement RIGHT middle cerebral artery at the level of the mid M1 segment. Mild stenosis LEFT M1 segment. Patent anterior communicating artery. Flow related enhancement of the anterior cerebral arteries, including more distal segments.  No high-grade stenosis, aneurysm. Moderate luminal irregularity of the LEFT middle cerebral artery, to lesser extent the anterior cerebral arteries compatible with atherosclerosis.  Posterior circulation: LEFT vertebral artery is dominant. Basilar artery is patent, with normal flow related enhancement of the main branch vessels. Flow related enhancement of the posterior cerebral arteries. Focal high-grade stenosis versus pulsation artifact RIGHT P2 segments. Moderate stenosis LEFT P2 segment. Mild luminal regularity of the LEFT vertebral artery. Moderate luminal regularity of the mid to distal posterior cerebral arteries.  No large vessel occlusion, aneurysm.  IMPRESSION: MRI HEAD: Motion degraded examination. Multifocal acute on chronic RIGHT middle  cerebral artery territory infarcts, similar distribution. No hemorrhagic conversion.  Involutional changes. Moderate white matter changes compatible with chronic small vessel ischemic disease. Fall LEFT cerebellar small infarct.  MRA HEAD: Motion degraded examination. Occluded RIGHT middle cerebral artery.  Multiple stenosis of the cerebral arteries including high-grade stenosis versus pulsation artifact RIGHT P2 segment. Moderate luminal irregularity of the intracranial vessels compatible with atherosclerosis.  Possible RIGHT internal carotid artery origin stenosis, partially characterized. Considering degree of motion, findings may be better characterized on CT angiogram of the head and neck as clinically indicated.   Electronically Signed   By: Awilda Metro M.D.   On: 06/17/2015 00:16   Mr Brain Wo Contrast  06/10/2015   CLINICAL DATA:  Left-sided facial droop. Left upper and lower extremity weakness. Altered mental status.  EXAM: MRI HEAD WITHOUT CONTRAST  TECHNIQUE: Multiplanar, multiecho pulse sequences of the brain and surrounding structures were obtained without intravenous contrast.  COMPARISON:  CT head without contrast from the same day.  FINDINGS: Acute nonhemorrhagic infarct involves the right insular cortex, external capsule, and anterior right lentiform nucleus. The infarct extends into the right coronal radiata with tear through other punctate cortical lesions noted in the right temporal and frontal lobe.  T2 changes are associated.  Moderate generalized atrophy and white matter disease is also present.  Markedly attenuated flow is present in the cavernous  right internal carotid artery. There is no definite flow in the right middle cerebral artery or at the right MCA bifurcation. Flow is present in both ACA vessels and in the posterior circulation. The left internal carotid artery demonstrates normal flow.  No acute hemorrhage or mass lesion is present. The sagittal images are significantly  degraded by patient motion.  IMPRESSION: 1. Multi focal areas of acute nonhemorrhagic infarction involving the right MCA territory. These predominantly within the right insular cortex, portions the right temporal lobe and extending superiorly in the external capsule and coronal radiata. Additional foci are present in the right frontal lobe. 2. Attenuated or occluded flow in the cavernous right internal carotid artery and right M1 segment to lease the bifurcation. 3. Moderate atrophy and white matter disease otherwise without other acute foci of infarction. These results were called by telephone at the time of interpretation on 06/10/2015 at 5:26 pm to Dr. Carmell AustriaNate Pickering , who verbally acknowledged these results.   Electronically Signed   By: Marin Robertshristopher  Mattern M.D.   On: 06/10/2015 17:30   Mr Maxine GlennMra Head/brain Wo Cm  06/17/2015   CLINICAL DATA:  LEFT-sided weakness, and balance, fall. Recent RIGHT brain infarct. History of hypertension, COPD, depression.  EXAM: MRI HEAD WITHOUT CONTRAST  MRA HEAD WITHOUT CONTRAST  TECHNIQUE: Multiplanar, multiecho pulse sequences of the brain and surrounding structures were obtained without intravenous contrast. Angiographic images of the head were obtained using MRA technique without contrast.  COMPARISON:  MRI head June 10, 2015  FINDINGS: MRI HEAD FINDINGS  Motion degraded examination.  Patchy reduced diffusion RIGHT temporal lobe/insula, RIGHT basal ganglia, RIGHT parietal lobes with slight local further propagation. Corresponding low ADC values. No susceptibility artifact to suggest hemorrhage.  Ventricles and sulci are overall normal for patient's age. Patchy supratentorial white matter T2 hyperintensities exclusive of the aforementioned abnormality, similar. Old small LEFT cerebellar infarcts. No midline shift, mass effect or mass lesions.  No abnormal extra-axial fluid collections. Status post RIGHT ocular lens implant. Mild paranasal sinus mucosal thickening with LEFT  maxillary mucosal retention cyst. Trace RIGHT mastoid effusion. No abnormal sellar expansion. No cerebellar tonsillar ectopia. No suspicious calvarial bone marrow signal.  MRA HEAD FINDINGS  Motion degraded examination.  Anterior circulation: Included view of the proximal RIGHT internal carotid artery appears attenuated. Flow related enhancement of the included cervical, petrous, cavernous and supra clinoid internal carotid arteries. Mild stenosis RIGHT supraclinoid internal carotid artery. Complete loss of flow related enhancement RIGHT middle cerebral artery at the level of the mid M1 segment. Mild stenosis LEFT M1 segment. Patent anterior communicating artery. Flow related enhancement of the anterior cerebral arteries, including more distal segments.  No high-grade stenosis, aneurysm. Moderate luminal irregularity of the LEFT middle cerebral artery, to lesser extent the anterior cerebral arteries compatible with atherosclerosis.  Posterior circulation: LEFT vertebral artery is dominant. Basilar artery is patent, with normal flow related enhancement of the main branch vessels. Flow related enhancement of the posterior cerebral arteries. Focal high-grade stenosis versus pulsation artifact RIGHT P2 segments. Moderate stenosis LEFT P2 segment. Mild luminal regularity of the LEFT vertebral artery. Moderate luminal regularity of the mid to distal posterior cerebral arteries.  No large vessel occlusion, aneurysm.  IMPRESSION: MRI HEAD: Motion degraded examination. Multifocal acute on chronic RIGHT middle cerebral artery territory infarcts, similar distribution. No hemorrhagic conversion.  Involutional changes. Moderate white matter changes compatible with chronic small vessel ischemic disease. Fall LEFT cerebellar small infarct.  MRA HEAD: Motion degraded examination. Occluded RIGHT middle cerebral artery.  Multiple stenosis of the cerebral arteries including high-grade stenosis versus pulsation artifact RIGHT P2  segment. Moderate luminal irregularity of the intracranial vessels compatible with atherosclerosis.  Possible RIGHT internal carotid artery origin stenosis, partially characterized. Considering degree of motion, findings may be better characterized on CT angiogram of the head and neck as clinically indicated.   Electronically Signed   By: Awilda Metro M.D.   On: 06/17/2015 00:16    Microbiology: No results found for this or any previous visit (from the past 240 hour(s)).   Labs: Basic Metabolic Panel:  Recent Labs Lab 06/16/15 2130 06/17/15 0915 06/18/15 1615  NA 131* 131*  --   K 3.2* 3.5  --   CL 93* 95*  --   CO2 31 26  --   GLUCOSE 118* 103*  --   BUN 21* 18  --   CREATININE 1.45* 1.42* 1.44*  CALCIUM 11.5* 11.6*  --    Liver Function Tests: No results for input(s): AST, ALT, ALKPHOS, BILITOT, PROT, ALBUMIN in the last 168 hours. No results for input(s): LIPASE, AMYLASE in the last 168 hours. No results for input(s): AMMONIA in the last 168 hours. CBC:  Recent Labs Lab 06/16/15 2130 06/17/15 0915 06/18/15 1615  WBC 6.5 5.0 5.4  NEUTROABS 4.4  --   --   HGB 12.9 13.2 14.5  HCT 36.9 38.4 42.3  MCV 92.0 92.5 93.4  PLT 195 174 213   Cardiac Enzymes: No results for input(s): CKTOTAL, CKMB, CKMBINDEX, TROPONINI in the last 168 hours. BNP: BNP (last 3 results) No results for input(s): BNP in the last 8760 hours.  ProBNP (last 3 results) No results for input(s): PROBNP in the last 8760 hours.  CBG: No results for input(s): GLUCAP in the last 168 hours.     SignedMarlin Canary  [facility] 06/19/2015, 10:36 AM

## 2015-06-19 NOTE — Clinical Social Work Note (Signed)
Clinical Social Worker facilitated patient discharge including contacting patient family and facility to confirm patient discharge plans.  Clinical information faxed to facility and family agreeable with plan.  CSW [facility].  RN to call report prior to discharge.  DC packet to be prepared and placed on chart for transport with number for report.   Clinical Social Worker will sign off for now as social work intervention is no longer needed. Please consult us again if new need arises.  Derenda FennelBashira Eavan Gonterman, MSW, LCSWA [phone] 06/19/2015 12:02 PM

## 2015-06-19 NOTE — Clinical Social Work Placement (Signed)
   CLINICAL SOCIAL WORK PLACEMENT  NOTE  Date:  06/19/2015  Patient Details  Name: Adriana Middleton MRN: [id number] Date of Birth: March 17, 1930  Clinical Social Work is seeking post-discharge placement for this patient at the Skilled  Nursing Facility level of care (*CSW will initial, date and re-position this form in  chart as items are completed):  Yes   Patient/family provided with [facility] Clinical Social Work Department's list of facilities offering this level of care within the geographic area requested by the patient (or if unable, by the patient's family).  Yes   Patient/family informed of their freedom to choose among providers that offer the needed level of care, that participate in Medicare, Medicaid or managed care program needed by the patient, have an available bed and are willing to accept the patient.  Yes   Patient/family informed of [facility]'s ownership interest in [facility] and [facility], as well as of the fact that they are under no obligation to receive care at these facilities.  PASRR submitted to EDS on 06/18/15     PASRR number received on 06/18/15     Existing PASRR number confirmed on       FL2 transmitted to all facilities in geographic area requested by pt/family on 06/18/15     FL2 transmitted to all facilities within larger geographic area on       Patient informed that his/her managed care company has contracts with or will negotiate with certain facilities, including the following:   (YES)     Yes   Patient/family informed of bed offers received.  Patient chooses bed at  [facility] )     Physician recommends and patient chooses bed at      Patient to be transferred to  [facility] ) on 06/19/15.  Patient to be transferred to facility by  [facility] )     Patient family notified on 06/19/15 of transfer.  Name of family member notified:   (Pt's dtr, [name])     PHYSICIAN Please prepare prescriptions      Additional Comment:    _______________________________________________ Derenda FennelBashira Jaslen Adcox, MSW, LCSWA [phone] 06/19/2015 12:01 PM

## 2015-06-20 ENCOUNTER — Encounter (HOSPITAL_COMMUNITY): Payer: Self-pay | Admitting: Internal Medicine

## 2015-06-21 DIAGNOSIS — J302 Other seasonal allergic rhinitis: Secondary | ICD-10-CM | POA: Diagnosis not present

## 2015-06-21 DIAGNOSIS — J3489 Other specified disorders of nose and nasal sinuses: Secondary | ICD-10-CM | POA: Diagnosis not present

## 2015-06-27 ENCOUNTER — Ambulatory Visit: Payer: Medicare Other

## 2015-07-01 ENCOUNTER — Ambulatory Visit (INDEPENDENT_AMBULATORY_CARE_PROVIDER_SITE_OTHER): Payer: Medicare Other | Admitting: *Deleted

## 2015-07-01 DIAGNOSIS — I63511 Cerebral infarction due to unspecified occlusion or stenosis of right middle cerebral artery: Secondary | ICD-10-CM

## 2015-07-01 LAB — CUP PACEART INCLINIC DEVICE CHECK
MDC IDC SESS DTM: 20160801173458
MDC IDC SET ZONE DETECTION INTERVAL: 2000 ms
MDC IDC SET ZONE DETECTION INTERVAL: 3000 ms
MDC IDC SET ZONE DETECTION INTERVAL: 410 ms

## 2015-07-01 NOTE — Progress Notes (Signed)
ILR Wound check appointment. Steri-strips removed. Wound without redness or edema. Incision edges approximated, wound well healed. Normal device function. Pt with 0 tachy episodes; 0 brady episodes; 0 asystole. [facility] summary reports QMO & ROV w/ GT in 4mo.

## 2015-07-04 DIAGNOSIS — M6281 Muscle weakness (generalized): Secondary | ICD-10-CM | POA: Diagnosis not present

## 2015-07-04 DIAGNOSIS — I6931 Cognitive deficits following cerebral infarction: Secondary | ICD-10-CM | POA: Diagnosis not present

## 2015-07-04 DIAGNOSIS — R2681 Unsteadiness on feet: Secondary | ICD-10-CM | POA: Diagnosis not present

## 2015-07-04 DIAGNOSIS — I6930 Unspecified sequelae of cerebral infarction: Secondary | ICD-10-CM | POA: Diagnosis not present

## 2015-07-10 ENCOUNTER — Encounter: Payer: Self-pay | Admitting: Internal Medicine

## 2015-07-10 DIAGNOSIS — I639 Cerebral infarction, unspecified: Secondary | ICD-10-CM | POA: Diagnosis not present

## 2015-07-10 DIAGNOSIS — I10 Essential (primary) hypertension: Secondary | ICD-10-CM | POA: Diagnosis not present

## 2015-07-10 DIAGNOSIS — E785 Hyperlipidemia, unspecified: Secondary | ICD-10-CM | POA: Diagnosis not present

## 2015-07-19 ENCOUNTER — Encounter: Payer: Self-pay | Admitting: Neurology

## 2015-07-19 ENCOUNTER — Ambulatory Visit (INDEPENDENT_AMBULATORY_CARE_PROVIDER_SITE_OTHER): Payer: Medicare Other | Admitting: Neurology

## 2015-07-19 VITALS — BP 107/61 | HR 58 | Ht 61.0 in | Wt 120.0 lb

## 2015-07-19 DIAGNOSIS — I639 Cerebral infarction, unspecified: Secondary | ICD-10-CM

## 2015-07-19 DIAGNOSIS — F32A Depression, unspecified: Secondary | ICD-10-CM

## 2015-07-19 DIAGNOSIS — I6340 Cerebral infarction due to embolism of unspecified cerebral artery: Secondary | ICD-10-CM | POA: Diagnosis not present

## 2015-07-19 DIAGNOSIS — F329 Major depressive disorder, single episode, unspecified: Secondary | ICD-10-CM | POA: Diagnosis not present

## 2015-07-19 MED ORDER — CLOPIDOGREL BISULFATE 75 MG PO TABS: 75.0000 mg | ORAL_TABLET | Freq: Every day | ORAL | Status: AC

## 2015-07-19 MED ORDER — ATORVASTATIN CALCIUM 20 MG PO TABS: 20.0000 mg | ORAL_TABLET | Freq: Every day | ORAL | Status: DC

## 2015-07-19 MED ORDER — BUPROPION HCL ER (SR) 100 MG PO TB12: 100.0000 mg | ORAL_TABLET | Freq: Two times a day (BID) | ORAL | Status: DC

## 2015-07-19 NOTE — Progress Notes (Signed)
[facility] [address] [place]. [place] [address] [phone]       OFFICE FOLLOW-UP NOTE  [name] Date of Birth:  12-07-1929 Medical Record Number:  [id number]   HPI: [name] is seen today for the first office follow-up visit following hospital admission for stroke in June 2016. Adriana Middleton is an 81 y.o. female with a past medical history significant for HTN , MVP, depression, COPD, brought in by family for further evaluation of left sided weakness, imbalance, fall, recent right brain infarct. Patient was seen in this ED on 7/11 with complains of left face weakness, slurred speech, and left sided weakness (LKW 06/09/2015, time unknown). MRI brain demonstrated multifocal areas of acute right MCA distribution infarcts as well as findings suggestive of markedly attenuated flow in the cavernous right internal carotid artery and no definite flow in the right middle cerebral artery or at the right MCA bifurcation. Patient refused to be admitted to the hospital and left against medical advice. She has been taking aspirin..Adriana Middleton returns to the ED today 06/16/2015 complaining of poor balance needing to use a walker in the house, fall at least x 1 time, and increasing weakness. She states that she cannot tolerate the weakness anymore and needs something to be done. She complains of left-sided weakness. Daughter at the bedside said that her speech is actually improved. Denies HA, vertigo, double vision, difficulty swallowing, confusion, slurred speech, language or vision impairment. Serologies significant for Na 31, K 3.2, Cr 1.45. UA without obvious infection. At baseline highly functional able to cook clean and do all of her ADLs and was still driving until 3 weeks ago. She lives with her daughter and has done so for the past 5 years. Patient was not administered TPA secondary to late presentation. She was admitted for further evaluation and treatment. MRI scan of the brain  was motion degraded and showed right MCA territory infarct similar to the previous MRI from one week ago. The moderate changes of chronic microvascular ischemia. MRA of the bed brain was also motion degraded but showed occluded right middle cerebral artery and multiple stenosis in the intracranial vessels versus pulsation artifact. CT angiogram of the head and neck showed severe stenosis at the origin of both vertebral arteries. Carotid Doppler showed 40-59% ICA stenosis bilaterally in the neck. Transthoracic echo showed normal ejection fraction without cardiac source of embolism. Transesophageal echo showed dilated left atrial appendage but no cardiac source for embolism or PFO. There was grade 3 atheroma of the distal ascending aorta. LDL cholesterol was elevated at 138. Hemoglobin A1c was 5.6. Patient had loop recorder placed but so far atrial fibrillation has not been found. Patient is at home living with her daughter. She is able to walk with a walker. She has not had any falls. She's had a flare up of her depression and has crying spells and is not sleeping well. She gets up in the middle of the night. She has been treated with antidepressive and some the past but not for long time. She is tolerating aspirin and Plavix without significant bleeding and only minor bruising. She is also tolerating Lipitor without side effects. Patient has an appointment with her primary physician next week. ROS:   14 system review of systems is positive for  weight loss and hearing loss, cough, memory loss, confusion, weakness, slurred speech, joint pain, anxiety, depression, not enough sleep, change in appetite, insomnia.  PMH:  Past Medical History  Diagnosis Date  .  DEPRESSION 07/08/2007  . CATARACTS, BILATERAL 04/18/2008  . HYPERTENSION 07/08/2007  . COPD 04/18/2008  . GERD 07/08/2007  . ARTHRITIS, LEFT HIP 04/18/2008  . Unspecified disorder of parathyroid gland 11/14/2010    Social History:  Social History   Social  History  . Marital Status: Widowed    Spouse Name: N/A  . Number of Children: N/A  . Years of Education: N/A   Occupational History  . Not on file.   Social History Main Topics  . Smoking status: Former Games developer  . Smokeless tobacco: Never Used     Comment: quit 15 yrs ago.    . Alcohol Use: No  . Drug Use: Not on file  . Sexual Activity: Not on file   Other Topics Concern  . Not on file   Social History Narrative    Medications:   Current Outpatient Prescriptions on File Prior to Visit  Medication Sig Dispense Refill  . aspirin EC 325 MG EC tablet Take 1 tablet (325 mg total) by mouth daily. 30 tablet 0  . atenolol-chlorthalidone (TENORETIC) 50-25 MG per tablet TAKE 1 TABLET BY MOUTH DAILY. 100 tablet 0  . atorvastatin (LIPITOR) 20 MG tablet Take 1 tablet (20 mg total) by mouth daily.    . clopidogrel (PLAVIX) 75 MG tablet Take 1 tablet (75 mg total) by mouth daily.    . diphenhydrAMINE (BENADRYL) 25 mg capsule Take 25 mg by mouth every 6 (six) hours as needed.    . ENSURE PLUS (ENSURE PLUS) LIQD Take 237 mLs by mouth daily.    . fluticasone (FLOVENT DISKUS) 50 MCG/BLIST diskus inhaler Inhale 1 puff into the lungs daily.    Marland Kitchen ibuprofen (ADVIL,MOTRIN) 200 MG tablet Take 400 mg by mouth daily as needed (pain).     No current facility-administered medications on file prior to visit.    Allergies:   Allergies  Allergen Reactions  . Codeine Itching  . Simvastatin Itching    Physical Exam General: Frail elderly African-American lady sitting in a wheelchair., seated, in no evident distress Head: head normocephalic and atraumatic.  Neck: supple with no carotid or supraclavicular bruits Cardiovascular: regular rate and rhythm, no murmurs Musculoskeletal: no deformity Skin:  no rash/petichiae Vascular:  Normal pulses all extremities Filed Vitals:   07/19/15 0832  BP: 107/61  Pulse: 58   Neurologic Exam Mental Status: Awake and fully alert. Oriented to place and time.  Recent and remote memory intact. Attention span, concentration and fund of knowledge appropriate. Mood and affect labile with patient crying easily Mini-Mental status exam scored 17/30 with deficits in orientation, attention, calculation, recall and following 3 step commands. Animal naming test 7. Clock drawing 1/4. Geriatric depression scale 5 suggestive of mild depression..  Cranial Nerves: Fundoscopic exam reveals sharp disc margins. Pupils equal, briskly reactive to light. Extraocular movements full without nystagmus. Visual fields show partial left homonymous hemianopsia to confrontation. Hearing intact. Facial sensation intact. Mild left lower facial weakness. Tongue, palate moves normally and symmetrically.  Motor: Mild left grip weakness. Diminished fine finger movements on the left. Orbits right over left upper extremity. No upper or lower extremity drift. Mild weakness of left leg and ankle dorsiflexors. Sensory.: intact to touch ,pinprick .position and vibratory sensation.  Coordination: Rapid alternating movements normal in all extremities. Finger-to-nose and heel-to-shin performed accurately bilaterally. Gait and Station: Arises from chair without difficulty. Stance is normal. Gait demonstrates normal stride length and balance . Able to heel, toe and tandem walk without difficulty.  Reflexes: 2+  and asymmetric brisker on the left.. Toes downgoing.   NIHSS 2 Modified Rankin  2   ASSESSMENT: 81 year old lady with a right MCA branch infarct in June 2016 likely from intracranial atherosclerosis. Patient has residual   mild left-sided weakness and significant depression. Vascular risk factors of hypertension, hyperlipidemia, atherosclerosis, age and sex    PLAN: I had a long d/w patient and daughter about her recent stroke, risk for recurrent stroke/TIAs, personally independently reviewed imaging studies and stroke evaluation results and answered questions.Continue aspirin 325 mg orally  every day and clopidogrel 75 mg orally every day  for secondary stroke prevention x 1 month and then stop aspirin and maintain strict control of hypertension with blood pressure goal below 130/90, diabetes with hemoglobin A1c goal below 6.5% and lipids with LDL cholesterol goal below 70 mg/dL. I also advised the patient to eat a healthy diet with plenty of whole grains, cereals, fruits and vegetables, exercise regularly and maintain ideal body weight .start Wellbutrin SR 100 mg daily for depression and follow-up with primary care physician for the same. Patient was given a refill for Plavix and Lipitor at her request. Followup in the future with Butch Penny, NP in 3 months or call earlier if necessary.  Delia Heady, MD  Note: This document was prepared with digital dictation and possible smart phrase technology. Any transcriptional errors that result from this process are unintentional

## 2015-07-19 NOTE — Patient Instructions (Signed)
I had a long d/w patient and daughter about her recent stroke, risk for recurrent stroke/TIAs, personally independently reviewed imaging studies and stroke evaluation results and answered questions.Continue aspirin 325 mg orally every day and clopidogrel 75 mg orally every day  for secondary stroke prevention x 1 month and then stop aspirin and maintain strict control of hypertension with blood pressure goal below 130/90, diabetes with hemoglobin A1c goal below 6.5% and lipids with LDL cholesterol goal below 70 mg/dL. I also advised the patient to eat a healthy diet with plenty of whole grains, cereals, fruits and vegetables, exercise regularly and maintain ideal body weight .start Wellbutrin SR 100 mg daily for depression and follow-up with primary care physician for the same. Followup in the future with Butch Penny, NP in 3 months or call earlier if necessary.  Stroke Prevention Some medical conditions and behaviors are associated with an increased chance of having a stroke. You may prevent a stroke by making healthy choices and managing medical conditions. HOW CAN I REDUCE MY RISK OF HAVING A STROKE?   Stay physically active. Get at least 30 minutes of activity on most or all days.  Do not smoke. It may also be helpful to avoid exposure to secondhand smoke.  Limit alcohol use. Moderate alcohol use is considered to be:  No more than 2 drinks per day for men.  No more than 1 drink per day for nonpregnant women.  Eat healthy foods. This involves:  Eating 5 or more servings of fruits and vegetables a day.  Making dietary changes that address high blood pressure (hypertension), high cholesterol, diabetes, or obesity.  Manage your cholesterol levels.  Making food choices that are high in fiber and low in saturated fat, trans fat, and cholesterol may control cholesterol levels.  Take any prescribed medicines to control cholesterol as directed by your health care provider.  Manage your  diabetes.  Controlling your carbohydrate and sugar intake is recommended to manage diabetes.  Take any prescribed medicines to control diabetes as directed by your health care provider.  Control your hypertension.  Making food choices that are low in salt (sodium), saturated fat, trans fat, and cholesterol is recommended to manage hypertension.  Take any prescribed medicines to control hypertension as directed by your health care provider.  Maintain a healthy weight.  Reducing calorie intake and making food choices that are low in sodium, saturated fat, trans fat, and cholesterol are recommended to manage weight.  Stop drug abuse.  Avoid taking birth control pills.  Talk to your health care provider about the risks of taking birth control pills if you are over 71 years old, smoke, get migraines, or have ever had a blood clot.  Get evaluated for sleep disorders (sleep apnea).  Talk to your health care provider about getting a sleep evaluation if you snore a lot or have excessive sleepiness.  Take medicines only as directed by your health care provider.  For some people, aspirin or blood thinners (anticoagulants) are helpful in reducing the risk of forming abnormal blood clots that can lead to stroke. If you have the irregular heart rhythm of atrial fibrillation, you should be on a blood thinner unless there is a good reason you cannot take them.  Understand all your medicine instructions.  Make sure that other conditions (such as anemia or atherosclerosis) are addressed. SEEK IMMEDIATE MEDICAL CARE IF:   You have sudden weakness or numbness of the face, arm, or leg, especially on one side of the body.  Your face or eyelid droops to one side.  You have sudden confusion.  You have trouble speaking (aphasia) or understanding.  You have sudden trouble seeing in one or both eyes.  You have sudden trouble walking.  You have dizziness.  You have a loss of balance or  coordination.  You have a sudden, severe headache with no known cause.  You have new chest pain or an irregular heartbeat. Any of these symptoms may represent a serious problem that is an emergency. Do not wait to see if the symptoms will go away. Get medical help at once. Call your local emergency services (911 in [place] Do not drive yourself to the hospital. Document Released: 12/24/2004 Document Revised: 04/02/2014 Document Reviewed: 05/19/2013 [facility] Patient Information 2015 [place], [place]. This information is not intended to replace advice given to you by your health care provider. Make sure you discuss any questions you have with your health care provider.

## 2015-07-22 ENCOUNTER — Ambulatory Visit (INDEPENDENT_AMBULATORY_CARE_PROVIDER_SITE_OTHER): Payer: Medicare Other | Admitting: *Deleted

## 2015-07-22 DIAGNOSIS — I639 Cerebral infarction, unspecified: Secondary | ICD-10-CM | POA: Diagnosis not present

## 2015-07-22 DIAGNOSIS — I63511 Cerebral infarction due to unspecified occlusion or stenosis of right middle cerebral artery: Secondary | ICD-10-CM | POA: Diagnosis not present

## 2015-07-24 NOTE — Progress Notes (Signed)
Loop recorder 

## 2015-07-25 ENCOUNTER — Encounter: Payer: Self-pay | Admitting: Cardiology

## 2015-07-29 LAB — CUP PACEART REMOTE DEVICE CHECK: MDC IDC SESS DTM: 20160829085550

## 2015-08-07 ENCOUNTER — Encounter: Payer: Self-pay | Admitting: Internal Medicine

## 2015-08-20 ENCOUNTER — Ambulatory Visit (INDEPENDENT_AMBULATORY_CARE_PROVIDER_SITE_OTHER): Payer: Medicare Other | Admitting: *Deleted

## 2015-08-20 DIAGNOSIS — I63511 Cerebral infarction due to unspecified occlusion or stenosis of right middle cerebral artery: Secondary | ICD-10-CM | POA: Diagnosis not present

## 2015-08-21 NOTE — Progress Notes (Signed)
Loop recorder 

## 2015-08-24 LAB — CUP PACEART REMOTE DEVICE CHECK: Date Time Interrogation Session: 20160920210529

## 2015-08-24 NOTE — Progress Notes (Signed)
Carelink summary report received. Battery status OK. Normal device function. No new symptom episodes, tachy episodes, brady, or pause episodes. No new AF episodes. Monthly summary reports and ROV with GT in 06/2016. 

## 2015-08-26 DIAGNOSIS — H902 Conductive hearing loss, unspecified: Secondary | ICD-10-CM | POA: Diagnosis not present

## 2015-08-26 DIAGNOSIS — N329 Bladder disorder, unspecified: Secondary | ICD-10-CM | POA: Diagnosis not present

## 2015-08-26 DIAGNOSIS — I639 Cerebral infarction, unspecified: Secondary | ICD-10-CM | POA: Diagnosis not present

## 2015-08-26 DIAGNOSIS — I10 Essential (primary) hypertension: Secondary | ICD-10-CM | POA: Diagnosis not present

## 2015-08-29 ENCOUNTER — Encounter: Payer: Self-pay | Admitting: Cardiology

## 2015-09-04 ENCOUNTER — Encounter: Payer: Self-pay | Admitting: Internal Medicine

## 2015-09-05 ENCOUNTER — Encounter: Payer: Self-pay | Admitting: Cardiology

## 2015-09-19 ENCOUNTER — Ambulatory Visit (INDEPENDENT_AMBULATORY_CARE_PROVIDER_SITE_OTHER): Payer: Medicare Other | Admitting: *Deleted

## 2015-09-19 DIAGNOSIS — I63511 Cerebral infarction due to unspecified occlusion or stenosis of right middle cerebral artery: Secondary | ICD-10-CM | POA: Diagnosis not present

## 2015-09-20 DIAGNOSIS — I639 Cerebral infarction, unspecified: Secondary | ICD-10-CM | POA: Diagnosis not present

## 2015-09-20 DIAGNOSIS — N329 Bladder disorder, unspecified: Secondary | ICD-10-CM | POA: Diagnosis not present

## 2015-09-20 DIAGNOSIS — F329 Major depressive disorder, single episode, unspecified: Secondary | ICD-10-CM | POA: Diagnosis not present

## 2015-09-22 ENCOUNTER — Encounter (HOSPITAL_COMMUNITY): Payer: Self-pay | Admitting: Emergency Medicine

## 2015-09-22 ENCOUNTER — Emergency Department (HOSPITAL_COMMUNITY): Payer: Medicare Other

## 2015-09-22 ENCOUNTER — Other Ambulatory Visit: Payer: Self-pay

## 2015-09-22 ENCOUNTER — Inpatient Hospital Stay (HOSPITAL_COMMUNITY)
Admission: EM | Admit: 2015-09-22 | Discharge: 2015-09-23 | DRG: 682 | Disposition: A | Discharge status: Home Health | Payer: Medicare Other | Attending: Internal Medicine | Admitting: Internal Medicine

## 2015-09-22 DIAGNOSIS — F329 Major depressive disorder, single episode, unspecified: Secondary | ICD-10-CM | POA: Diagnosis present

## 2015-09-22 DIAGNOSIS — I129 Hypertensive chronic kidney disease with stage 1 through stage 4 chronic kidney disease, or unspecified chronic kidney disease: Secondary | ICD-10-CM | POA: Diagnosis not present

## 2015-09-22 DIAGNOSIS — E876 Hypokalemia: Secondary | ICD-10-CM | POA: Diagnosis present

## 2015-09-22 DIAGNOSIS — Z7902 Long term (current) use of antithrombotics/antiplatelets: Secondary | ICD-10-CM | POA: Diagnosis not present

## 2015-09-22 DIAGNOSIS — Z7951 Long term (current) use of inhaled steroids: Secondary | ICD-10-CM

## 2015-09-22 DIAGNOSIS — N183 Chronic kidney disease, stage 3 (moderate): Secondary | ICD-10-CM | POA: Diagnosis not present

## 2015-09-22 DIAGNOSIS — Z87891 Personal history of nicotine dependence: Secondary | ICD-10-CM

## 2015-09-22 DIAGNOSIS — R4182 Altered mental status, unspecified: Secondary | ICD-10-CM | POA: Diagnosis not present

## 2015-09-22 DIAGNOSIS — Z66 Do not resuscitate: Secondary | ICD-10-CM | POA: Diagnosis present

## 2015-09-22 DIAGNOSIS — Z515 Encounter for palliative care: Secondary | ICD-10-CM | POA: Diagnosis not present

## 2015-09-22 DIAGNOSIS — R778 Other specified abnormalities of plasma proteins: Secondary | ICD-10-CM

## 2015-09-22 DIAGNOSIS — E860 Dehydration: Secondary | ICD-10-CM | POA: Diagnosis not present

## 2015-09-22 DIAGNOSIS — N189 Chronic kidney disease, unspecified: Secondary | ICD-10-CM

## 2015-09-22 DIAGNOSIS — Z7401 Bed confinement status: Secondary | ICD-10-CM | POA: Diagnosis not present

## 2015-09-22 DIAGNOSIS — I248 Other forms of acute ischemic heart disease: Secondary | ICD-10-CM | POA: Diagnosis present

## 2015-09-22 DIAGNOSIS — R001 Bradycardia, unspecified: Secondary | ICD-10-CM | POA: Diagnosis present

## 2015-09-22 DIAGNOSIS — R748 Abnormal levels of other serum enzymes: Secondary | ICD-10-CM | POA: Diagnosis not present

## 2015-09-22 DIAGNOSIS — G9340 Encephalopathy, unspecified: Secondary | ICD-10-CM | POA: Diagnosis present

## 2015-09-22 DIAGNOSIS — Z681 Body mass index (BMI) 19 or less, adult: Secondary | ICD-10-CM | POA: Diagnosis not present

## 2015-09-22 DIAGNOSIS — I10 Essential (primary) hypertension: Secondary | ICD-10-CM | POA: Diagnosis not present

## 2015-09-22 DIAGNOSIS — I69354 Hemiplegia and hemiparesis following cerebral infarction affecting left non-dominant side: Secondary | ICD-10-CM

## 2015-09-22 DIAGNOSIS — R627 Adult failure to thrive: Secondary | ICD-10-CM | POA: Diagnosis present

## 2015-09-22 DIAGNOSIS — R7989 Other specified abnormal findings of blood chemistry: Secondary | ICD-10-CM

## 2015-09-22 DIAGNOSIS — F0390 Unspecified dementia without behavioral disturbance: Secondary | ICD-10-CM | POA: Diagnosis not present

## 2015-09-22 DIAGNOSIS — N179 Acute kidney failure, unspecified: Principal | ICD-10-CM | POA: Diagnosis present

## 2015-09-22 DIAGNOSIS — R634 Abnormal weight loss: Secondary | ICD-10-CM | POA: Diagnosis present

## 2015-09-22 DIAGNOSIS — Z7982 Long term (current) use of aspirin: Secondary | ICD-10-CM | POA: Diagnosis not present

## 2015-09-22 DIAGNOSIS — E210 Primary hyperparathyroidism: Secondary | ICD-10-CM | POA: Diagnosis present

## 2015-09-22 DIAGNOSIS — J449 Chronic obstructive pulmonary disease, unspecified: Secondary | ICD-10-CM | POA: Diagnosis not present

## 2015-09-22 DIAGNOSIS — Z79899 Other long term (current) drug therapy: Secondary | ICD-10-CM | POA: Diagnosis not present

## 2015-09-22 LAB — COMPREHENSIVE METABOLIC PANEL
ALT: 31 U/L (ref 14–54)
ANION GAP: 12 (ref 5–15)
AST: 56 U/L — ABNORMAL HIGH (ref 15–41)
Albumin: 3.7 g/dL (ref 3.5–5.0)
Alkaline Phosphatase: 67 U/L (ref 38–126)
BUN: 65 mg/dL — ABNORMAL HIGH (ref 6–20)
CO2: 31 mmol/L (ref 22–32)
Calcium: 13.6 mg/dL (ref 8.9–10.3)
Chloride: 94 mmol/L — ABNORMAL LOW (ref 101–111)
Creatinine, Ser: 3.18 mg/dL — ABNORMAL HIGH (ref 0.44–1.00)
GFR calc Af Amer: 14 mL/min — ABNORMAL LOW (ref >60)
GFR calc non Af Amer: 13 mL/min — ABNORMAL LOW (ref >60)
Glucose, Bld: 110 mg/dL — ABNORMAL HIGH (ref 65–99)
POTASSIUM: 2.8 mmol/L — AB (ref 3.5–5.1)
SODIUM: 137 mmol/L (ref 135–145)
Total Bilirubin: 1.1 mg/dL (ref 0.3–1.2)
Total Protein: 7.6 g/dL (ref 6.5–8.1)

## 2015-09-22 LAB — CBC WITH DIFFERENTIAL/PLATELET
BASOS PCT: 0 %
Basophils Absolute: 0 10*3/uL (ref 0.0–0.1)
EOS ABS: 0.1 10*3/uL (ref 0.0–0.7)
EOS PCT: 1 %
HCT: 44.4 % (ref 36.0–46.0)
Hemoglobin: 15.5 g/dL — ABNORMAL HIGH (ref 12.0–15.0)
LYMPHS ABS: 1 10*3/uL (ref 0.7–4.0)
Lymphocytes Relative: 11 %
MCH: 32.4 pg (ref 26.0–34.0)
MCHC: 34.9 g/dL (ref 30.0–36.0)
MCV: 92.9 fL (ref 78.0–100.0)
MONO ABS: 1 10*3/uL (ref 0.1–1.0)
Monocytes Relative: 11 %
Neutro Abs: 7.6 10*3/uL (ref 1.7–7.7)
Neutrophils Relative %: 77 %
Platelets: 239 10*3/uL (ref 150–400)
RBC: 4.78 MIL/uL (ref 3.87–5.11)
RDW: 12.8 % (ref 11.5–15.5)
WBC: 9.7 10*3/uL (ref 4.0–10.5)

## 2015-09-22 LAB — URINALYSIS, ROUTINE W REFLEX MICROSCOPIC
Glucose, UA: NEGATIVE mg/dL
Hgb urine dipstick: NEGATIVE
KETONES UR: 15 mg/dL — AB
Leukocytes, UA: NEGATIVE
NITRITE: NEGATIVE
Protein, ur: NEGATIVE mg/dL
SPECIFIC GRAVITY, URINE: 1.018 (ref 1.005–1.030)
Urobilinogen, UA: 1 mg/dL (ref 0.0–1.0)
pH: 5 (ref 5.0–8.0)

## 2015-09-22 LAB — BASIC METABOLIC PANEL
ANION GAP: 10 (ref 5–15)
BUN: 53 mg/dL — AB (ref 6–20)
CALCIUM: 11 mg/dL — AB (ref 8.9–10.3)
CO2: 25 mmol/L (ref 22–32)
Chloride: 102 mmol/L (ref 101–111)
Creatinine, Ser: 2.38 mg/dL — ABNORMAL HIGH (ref 0.44–1.00)
GFR calc Af Amer: 21 mL/min — ABNORMAL LOW (ref >60)
GFR, EST NON AFRICAN AMERICAN: 18 mL/min — AB (ref >60)
GLUCOSE: 89 mg/dL (ref 65–99)
POTASSIUM: 3.1 mmol/L — AB (ref 3.5–5.1)
SODIUM: 137 mmol/L (ref 135–145)

## 2015-09-22 LAB — MAGNESIUM: MAGNESIUM: 1.9 mg/dL (ref 1.7–2.4)

## 2015-09-22 LAB — I-STAT TROPONIN, ED: TROPONIN I, POC: 0.16 ng/mL — AB (ref 0.00–0.08)

## 2015-09-22 MED ORDER — ACETAMINOPHEN 325 MG PO TABS
650.0000 mg | ORAL_TABLET | Freq: Four times a day (QID) | ORAL | Status: DC | PRN
  Administered 2015-09-22: 650 mg via ORAL
  Filled 2015-09-22: qty 2

## 2015-09-22 MED ORDER — FLEET ENEMA 7-19 GM/118ML RE ENEM
1.0000 | ENEMA | Freq: Once | RECTAL | Status: AC
  Administered 2015-09-22: 1 via RECTAL
  Filled 2015-09-22: qty 1

## 2015-09-22 MED ORDER — ONDANSETRON HCL 4 MG/2ML IJ SOLN: 4.0000 mg | Freq: Four times a day (QID) | INTRAMUSCULAR | Status: DC | PRN

## 2015-09-22 MED ORDER — FUROSEMIDE 10 MG/ML IJ SOLN
40.0000 mg | Freq: Once | INTRAMUSCULAR | Status: AC
  Administered 2015-09-22: 40 mg via INTRAVENOUS
  Filled 2015-09-22: qty 4

## 2015-09-22 MED ORDER — DIPHENHYDRAMINE HCL 25 MG PO CAPS
25.0000 mg | ORAL_CAPSULE | Freq: Four times a day (QID) | ORAL | Status: DC | PRN
  Administered 2015-09-22: 25 mg via ORAL
  Filled 2015-09-22: qty 1

## 2015-09-22 MED ORDER — CLOPIDOGREL BISULFATE 75 MG PO TABS
75.0000 mg | ORAL_TABLET | Freq: Every day | ORAL | Status: DC
  Administered 2015-09-22 – 2015-09-23 (×2): 75 mg via ORAL
  Filled 2015-09-22 (×2): qty 1

## 2015-09-22 MED ORDER — ENSURE PLUS PO LIQD: 237.0000 mL | Freq: Every day | ORAL | Status: DC

## 2015-09-22 MED ORDER — ALUM & MAG HYDROXIDE-SIMETH 200-200-20 MG/5ML PO SUSP: 30.0000 mL | Freq: Four times a day (QID) | ORAL | Status: DC | PRN

## 2015-09-22 MED ORDER — ENSURE ENLIVE PO LIQD: 237.0000 mL | ORAL | Status: DC

## 2015-09-22 MED ORDER — SODIUM CHLORIDE 0.9 % IV BOLUS (SEPSIS)
1000.0000 mL | Freq: Once | INTRAVENOUS | Status: AC
  Administered 2015-09-22: 1000 mL via INTRAVENOUS

## 2015-09-22 MED ORDER — HYDRALAZINE HCL 20 MG/ML IJ SOLN: 5.0000 mg | Freq: Four times a day (QID) | INTRAMUSCULAR | Status: DC | PRN

## 2015-09-22 MED ORDER — ATORVASTATIN CALCIUM 20 MG PO TABS
20.0000 mg | ORAL_TABLET | Freq: Every day | ORAL | Status: DC
  Administered 2015-09-22 – 2015-09-23 (×2): 20 mg via ORAL
  Filled 2015-09-22 (×2): qty 1

## 2015-09-22 MED ORDER — ASPIRIN 325 MG PO TABS
325.0000 mg | ORAL_TABLET | Freq: Once | ORAL | Status: AC
  Administered 2015-09-22: 325 mg via ORAL
  Filled 2015-09-22: qty 1

## 2015-09-22 MED ORDER — POTASSIUM CHLORIDE 10 MEQ/100ML IV SOLN
10.0000 meq | INTRAVENOUS | Status: AC
  Administered 2015-09-22 (×3): 10 meq via INTRAVENOUS
  Filled 2015-09-22 (×4): qty 100

## 2015-09-22 MED ORDER — POTASSIUM CHLORIDE CRYS ER 20 MEQ PO TBCR
40.0000 meq | EXTENDED_RELEASE_TABLET | Freq: Three times a day (TID) | ORAL | Status: AC
  Administered 2015-09-22: 40 meq via ORAL
  Filled 2015-09-22: qty 2

## 2015-09-22 MED ORDER — POLYETHYLENE GLYCOL 3350 17 G PO PACK: 17.0000 g | PACK | Freq: Every day | ORAL | Status: DC | PRN

## 2015-09-22 MED ORDER — HEPARIN SODIUM (PORCINE) 5000 UNIT/ML IJ SOLN
5000.0000 [IU] | Freq: Three times a day (TID) | INTRAMUSCULAR | Status: DC
  Administered 2015-09-22 – 2015-09-23 (×2): 5000 [IU] via SUBCUTANEOUS
  Filled 2015-09-22 (×2): qty 1

## 2015-09-22 MED ORDER — ONDANSETRON HCL 4 MG PO TABS: 4.0000 mg | ORAL_TABLET | Freq: Four times a day (QID) | ORAL | Status: DC | PRN

## 2015-09-22 MED ORDER — CITALOPRAM HYDROBROMIDE 20 MG PO TABS
10.0000 mg | ORAL_TABLET | Freq: Every day | ORAL | Status: DC
  Administered 2015-09-22 – 2015-09-23 (×2): 10 mg via ORAL
  Filled 2015-09-22 (×2): qty 1

## 2015-09-22 MED ORDER — BUPROPION HCL ER (SR) 100 MG PO TB12
100.0000 mg | ORAL_TABLET | Freq: Two times a day (BID) | ORAL | Status: DC
  Administered 2015-09-22 – 2015-09-23 (×2): 100 mg via ORAL
  Filled 2015-09-22 (×5): qty 1

## 2015-09-22 MED ORDER — ACETAMINOPHEN 650 MG RE SUPP: 650.0000 mg | Freq: Four times a day (QID) | RECTAL | Status: DC | PRN

## 2015-09-22 MED ORDER — IBUPROFEN 400 MG PO TABS
400.0000 mg | ORAL_TABLET | Freq: Every day | ORAL | Status: DC | PRN
  Administered 2015-09-22: 400 mg via ORAL
  Filled 2015-09-22: qty 1

## 2015-09-22 MED ORDER — SODIUM CHLORIDE 0.9 % IV SOLN
INTRAVENOUS | Status: AC
  Administered 2015-09-22 (×2): via INTRAVENOUS

## 2015-09-22 NOTE — ED Notes (Signed)
Patient returned from CT

## 2015-09-22 NOTE — ED Provider Notes (Signed)
CSN: [id number]     Arrival date & time 09/22/15  [id number] History   First MD Initiated Contact with Patient 09/22/15 0759     Chief Complaint  Patient presents with  . Altered Mental Status     (Consider location/radiation/quality/duration/timing/severity/associated sxs/prior Treatment) The history is provided by the patient and the spouse.  Elige Kovelyn E Trotti is a 81 y.o. female hx of COPD, HTN, here presenting with weakness, failure to thrive. Patient lives at home with her family. As per the daughter, patient has been refusing to eat or drink for the last week or so. States that when she was offered food she just spits it right out. She is also weaker and at baseline walks with A walker but now is bed bound for the last week. She had history of stroke and has been weaker on the left side but now is just weak all over. Denies any fevers or chills or cough or abdominal pain or vomiting. Patient unable to provide much history.   Level V caveat- AMS, dementia   Past Medical History  Diagnosis Date  . DEPRESSION 07/08/2007  . CATARACTS, BILATERAL 04/18/2008  . HYPERTENSION 07/08/2007  . COPD 04/18/2008  . GERD 07/08/2007  . ARTHRITIS, LEFT HIP 04/18/2008  . 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 Colostomy      partial  . Ep implantable device N/A 06/18/2015    Procedure: Loop Recorder Insertion;  Surgeon: Marinus MawGregg W Taylor, MD;  Location: [facility] INVASIVE CV LAB;  Service: Cardiovascular;  Laterality: N/A;  . Tee without cardioversion N/A 06/18/2015    Procedure: TRANSESOPHAGEAL ECHOCARDIOGRAM (TEE);  Surgeon: Chrystie NoseKenneth C Hilty, MD;  [facility];  Service: Cardiovascular;  Laterality: N/A;   Family History  Problem Relation Age of Onset  . Diabetes Other   . Hypertension Other   . Cancer Other     prostate  . Heart disease Other    Social History  Substance Use Topics  . Smoking status: Former Games developermoker  . Smokeless tobacco:  Never Used     Comment: quit 15 yrs ago.    . Alcohol Use: No   OB History    No data available     Review of Systems  Unable to perform ROS: Mental status change  All other systems reviewed and are negative.     Allergies  Codeine and Simvastatin  Home Medications   Prior to Admission medications   Medication Sig Start Date End Date Taking? Authorizing Provider  aspirin EC 325 MG EC tablet Take 1 tablet (325 mg total) by mouth daily. 06/19/15   Joseph ArtJessica U Vann, DO  atenolol-chlorthalidone (TENORETIC) 50-25 MG per tablet TAKE 1 TABLET BY MOUTH DAILY. 12/31/14   Roderick PeeJeffrey A Todd, MD  atorvastatin (LIPITOR) 20 MG tablet Take 1 tablet (20 mg total) by mouth daily. 07/19/15   Micki RileyPramod S Sethi, MD  buPROPion (WELLBUTRIN SR) 100 MG 12 hr tablet Take 1 tablet (100 mg total) by mouth 2 (two) times daily. 07/19/15   Micki RileyPramod S Sethi, MD  clopidogrel (PLAVIX) 75 MG tablet Take 1 tablet (75 mg total) by mouth daily. 07/19/15   Micki RileyPramod S Sethi, MD  diphenhydrAMINE (BENADRYL) 25 mg capsule Take 25 mg by mouth every 6 (six) hours as needed. 06/21/15   Historical Provider, MD  ENSURE PLUS (ENSURE PLUS) LIQD Take 237 mLs by mouth daily. 06/24/15   Historical Provider, MD  fluticasone (FLOVENT DISKUS) 50 MCG/BLIST diskus  inhaler Inhale 1 puff into the lungs daily. 06/21/15   Historical Provider, MD  ibuprofen (ADVIL,MOTRIN) 200 MG tablet Take 400 mg by mouth daily as needed (pain).    Historical Provider, MD   BP 144/55 mmHg  Pulse 61  Temp(Src) 97.7 F (36.5 C) (Rectal)  Resp 16  Ht  (1.575 m)  Wt 91 lb (41.277 kg)  BMI 16.64 kg/m2  SpO2 100% Physical Exam  Constitutional:  Chronically ill, dehydrated   HENT:  Head: Normocephalic.  MM dry   Eyes: Conjunctivae are normal. Pupils are equal, round, and reactive to light.  Neck: Normal range of motion. Neck supple.  Cardiovascular: Normal rate, regular rhythm and normal heart sounds.   Pulmonary/Chest: Effort normal and breath sounds normal. No  respiratory distress. She has no wheezes. She has no rales.  Abdominal: Soft. Bowel sounds are normal. She exhibits no distension. There is no tenderness. There is no rebound.  Musculoskeletal: Normal range of motion. She exhibits no edema or tenderness.  Neurological: She is alert.  Alert, confused, moving all extremities   Skin:  Poor skin turgor   Psychiatric:  Unable   Nursing note and vitals reviewed.   ED Course  Procedures (including critical care time)  CRITICAL CARE Performed by: Silverio Lay, Nataly Pacifico   Total critical care time: 30 min   Critical care time was exclusive of separately billable procedures and treating other patients.  Critical care was necessary to treat or prevent imminent or life-threatening deterioration.  Critical care was time spent personally by me on the following activities: development of treatment plan with patient and/or surrogate as well as nursing, discussions with consultants, evaluation of patient's response to treatment, examination of patient, obtaining history from patient or surrogate, ordering and performing treatments and interventions, ordering and review of laboratory studies, ordering and review of radiographic studies, pulse oximetry and re-evaluation of patient's condition.   Labs Review Labs Reviewed  CBC WITH DIFFERENTIAL/PLATELET - Abnormal; Notable for the following:    Hemoglobin 15.5 (*)    All other components within normal limits  COMPREHENSIVE METABOLIC PANEL - Abnormal; Notable for the following:    Potassium 2.8 (*)    Chloride 94 (*)    Glucose, Bld 110 (*)    BUN 65 (*)    Creatinine, Ser 3.18 (*)    Calcium 13.6 (*)    AST 56 (*)    GFR calc non Af Amer 13 (*)    GFR calc Af Amer 14 (*)    All other components within normal limits  URINALYSIS, ROUTINE W REFLEX MICROSCOPIC (NOT AT [facility]) - Abnormal; Notable for the following:    Bilirubin Urine SMALL (*)    Ketones, ur 15 (*)    All other components within normal limits   I-STAT TROPOININ, ED - Abnormal; Notable for the following:    Troponin i, poc 0.16 (*)    All other components within normal limits  URINE CULTURE    Imaging Review Dg Chest 2 View  09/22/2015  CLINICAL DATA:  Will not eat or drink for 1 week, hypertension, COPD EXAM: CHEST  2 VIEW COMPARISON:  03/30/2011 FINDINGS: Enlargement of cardiac silhouette. Loop recorder projects over LEFT heart. Calcified tortuous aorta. Pulmonary vascularity normal. Lungs emphysematous but clear. No pleural effusion or pneumothorax. Bones demineralized. IMPRESSION: Enlargement of cardiac silhouette. Emphysematous changes without infiltrate. Electronically Signed   By: Ulyses Southward M.D.   On: 09/22/2015 09:31   Ct Head Wo Contrast  09/22/2015  CLINICAL  DATA:  Altered mental status. EXAM: CT HEAD WITHOUT CONTRAST TECHNIQUE: Contiguous axial images were obtained from the base of the skull through the vertex without intravenous contrast. COMPARISON:  06/10/2015 FINDINGS: Ventricles, cisterns and other CSF spaces are within normal. Old infarct over the central right frontoparietal region extending inferiorly into the medial right temporal region. Minimal chronic ischemic microvascular disease. No evidence of focal mass, mass effect, shift of midline structures or acute hemorrhage. No evidence of acute infarction. Remaining bones and soft tissues are within normal. IMPRESSION: No acute intracranial findings. Minimal chronic ischemic microvascular disease and old central right-sided infarct. Electronically Signed   By: Elberta Fortis M.D.   On: 09/22/2015 11:08   I have personally reviewed and evaluated these images and lab results as part of my medical decision-making.   EKG Interpretation   Date/Time:  Sunday September 22 2015 08:28:13 EDT Ventricular Rate:  51 PR Interval:  175 QRS Duration: 84 QT Interval:  449 QTC Calculation: 413 R Axis:   63 Text Interpretation:  Sinus rhythm Nonspecific T abnormalities, diffuse   leads No significant change since last tracing Confirmed by Christop Hippert  MD, Hellena Pridgen  ([id number]) on 09/22/2015 9:13:47 AM      MDM   Final diagnoses:  None   MARK BENECKE is a 81 y.o. female here with weakness, failure to thrive, dehydration. Consider UTI vs pneumonia vs renal failure. Will get labs, CXR, UA. Will hydrate and likely admit. Patient DNR.   11:26 AM Has acute renal failure with Cr 3, baseline 1.6. K 2.8, supplemented. Ca 14, given 2 L NS. CT head showed no lytic lesions. Given hypokalemia, hypercalcemia, renal failure, will admit. Also trop mildly positive but no EKG changes. Likely demand ischemia. Consulted Dr. [place] from cardiology who agrees with trending troponins and no need for further intervention for now.    Richardean Canal, MD 09/22/15 [phone]

## 2015-09-22 NOTE — ED Notes (Signed)
Patient transported to X-ray 

## 2015-09-22 NOTE — ED Notes (Signed)
Admitting team at bedside.

## 2015-09-22 NOTE — ED Notes (Signed)
Family reports she will not eat or drink for about a week; she spits out her food and medication. Denies any N/V/D. Getting weaker throughout the week.

## 2015-09-22 NOTE — H&P (Signed)
[facility] History and Physical                                                                                    Adriana Middleton, is a 81 y.o. female  MRN: [id number]   DOB - 05/14/1930  Admit Date - 09/22/2015  Outpatient Primary MD for the patient is Geraldo Pitter, MD  Referring Physician:  Dr. Silverio Lay, EDP  Chief Complaint:   Chief Complaint  Patient presents with  . Altered Mental Status     HPI  Adriana Middleton  is a 81 y.o. female, who is a retired Counsellor at [employer] -  with COPD, hypertension, depression, and a recent stroke that left her with left-sided weakness. She was brought to the emergency room by her children due to increased confusion, hallucinations, and anorexia. History was gathered from her daughter (medical attorney) she reports that over the past week to 10 days her mother has stopped eating, stopped getting out of bed, and has begun to hallucinate. She has seen snakes on her cane, and she has been speaking with her deceased husband. The daughter reports that her mother "wants to go". She has no complaints of pain, she has no signs of respiratory distress, her bowel movements are regular, she does still urinate. It has been very difficult to get her to take any by mouth intake including her medications. She has not been able to bear weight for at least week.   In the emergency department the patient was found to be bradycardic, hypokalemia (2.8), hypercalcemic (corrected Ca 13.8) and have an elevated troponin of 0.16.  She has lost 66 pounds since her stroke in July 2016.   Review of Systems  Unable to perform ROS: mental acuity    Past Medical History  Past Medical History  Diagnosis Date  . DEPRESSION 07/08/2007  . CATARACTS, BILATERAL 04/18/2008  . HYPERTENSION 07/08/2007  . COPD 04/18/2008  . GERD 07/08/2007  . ARTHRITIS, LEFT HIP 04/18/2008  . Unspecified disorder of parathyroid gland 11/14/2010    Past Surgical History   Procedure Laterality Date  . Colostomy      partial  . Ep implantable device N/A 06/18/2015    Procedure: Loop Recorder Insertion;  Surgeon: Marinus Maw, MD;  Location: [facility] INVASIVE CV LAB;  Service: Cardiovascular;  Laterality: N/A;  . Tee without cardioversion N/A 06/18/2015    Procedure: TRANSESOPHAGEAL ECHOCARDIOGRAM (TEE);  Surgeon: Chrystie Nose, MD;  Location: [facility] ENDOSCOPY;  Service: Cardiovascular;  Laterality: N/A;      Social History Social History  Substance Use Topics  . Smoking status: Former Games developer  . Smokeless tobacco: Never Used     Comment: quit 15 yrs ago.    . Alcohol Use: No  Lives with daughter.  Has not been out of bed for the past week.  Likely bedbound and dependent with ADLs.  Family History Family History  Problem Relation Age of Onset  . Diabetes Other   . Hypertension Other   . Cancer Other     prostate  . Heart disease Other     Prior to Admission medications   Medication Sig  Start Date End Date Taking? Authorizing Provider  atenolol-chlorthalidone (TENORETIC) 50-25 MG per tablet TAKE 1 TABLET BY MOUTH DAILY. 12/31/14   Roderick Pee, MD  atorvastatin (LIPITOR) 20 MG tablet Take 1 tablet (20 mg total) by mouth daily. 07/19/15   Micki Riley, MD  buPROPion (WELLBUTRIN SR) 100 MG 12 hr tablet Take 1 tablet (100 mg total) by mouth 2 (two) times daily. 07/19/15   Micki Riley, MD  clopidogrel (PLAVIX) 75 MG tablet Take 1 tablet (75 mg total) by mouth daily. 07/19/15   Micki Riley, MD  diphenhydrAMINE (BENADRYL) 25 mg capsule Take 25 mg by mouth every 6 (six) hours as needed. 06/21/15   Historical Provider, MD  ENSURE PLUS (ENSURE PLUS) LIQD Take 237 mLs by mouth daily. 06/24/15   Historical Provider, MD  fluticasone (FLOVENT DISKUS) 50 MCG/BLIST diskus inhaler Inhale 1 puff into the lungs daily. 06/21/15   Historical Provider, MD  ibuprofen (ADVIL,MOTRIN) 200 MG tablet Take 400 mg by mouth daily as needed (pain).    Historical Provider, MD     Allergies  Allergen Reactions  . Codeine Itching  . Simvastatin Itching    Physical Exam  Vitals  Blood pressure 148/88, pulse 71, temperature 97.7 F (36.5 C), temperature source Rectal, resp. rate 17, height  (1.575 m), weight 41.277 kg (91 lb), SpO2 98 %.   General: Elderly, thin female confused, asking to go home, shifting around on the bed in the ER. Daughter at bedside.  Not completely cooperative with exam.  Psych:  Confused but will give  Some appropriate answers to direct questions.  Neuro:   No F.N deficits, ALL C.Nerves Intact, Strength 5/5 all 4 extremities, Sensation intact all 4 extremities.  ENT:  Ears and Eyes appear Normal, Conjunctivae clear, PER. Dry oral mucosa without erythema or exudates.  Neck:  Supple, No lymphadenopathy appreciated  Respiratory:  Symmetrical chest wall movement, Good air movement bilaterally, CTAB.  Cardiac:  RRR, No Murmurs, no LE edema noted, no JVD.    Abdomen:  Positive bowel sounds, Soft, Non tender, Non distended,  No masses appreciated  Skin:  No Cyanosis, Normal Skin Turgor, No Skin Rash or Bruise.  Extremities:  Able to move all 4.   no effusions.  Data Review  Wt Readings from Last 3 Encounters:  09/22/15 41.277 kg (91 lb)  07/19/15 54.432 kg (120 lb)  06/18/15 71.215 kg (157 lb)    CBC  Recent Labs Lab 09/22/15 0823  WBC 9.7  HGB 15.5*  HCT 44.4  PLT 239  MCV 92.9  MCH 32.4  MCHC 34.9  RDW 12.8  LYMPHSABS 1.0  MONOABS 1.0  EOSABS 0.1  BASOSABS 0.0    Chemistries   Recent Labs Lab 09/22/15 0823  NA 137  K 2.8*  CL 94*  CO2 31  GLUCOSE 110*  BUN 65*  CREATININE 3.18*  CALCIUM 13.6*  AST 56*  ALT 31  ALKPHOS 67  BILITOT 1.1       Lab Results  Component Value Date   HGBA1C 5.6 06/17/2015       Urinalysis    Component Value Date/Time   COLORURINE YELLOW 09/22/2015 0838   APPEARANCEUR CLEAR 09/22/2015 0838   LABSPEC 1.018 09/22/2015 0838   PHURINE 5.0 09/22/2015  0838   GLUCOSEU NEGATIVE 09/22/2015 0838   HGBUR NEGATIVE 09/22/2015 0838   HGBUR trace-lysed 11/03/2010 0959   BILIRUBINUR SMALL* 09/22/2015 0838   BILIRUBINUR n 12/06/2012 1131   KETONESUR 15* 09/22/2015 1610  PROTEINUR NEGATIVE 09/22/2015 0838   PROTEINUR 1+ 12/06/2012 1131   UROBILINOGEN 1.0 09/22/2015 0838   UROBILINOGEN 0.2 12/06/2012 1131   NITRITE NEGATIVE 09/22/2015 0838   NITRITE n 12/06/2012 1131   LEUKOCYTESUR NEGATIVE 09/22/2015 0838    Imaging results:   Dg Chest 2 View  09/22/2015  CLINICAL DATA:  Will not eat or drink for 1 week, hypertension, COPD EXAM: CHEST  2 VIEW COMPARISON:  03/30/2011 FINDINGS: Enlargement of cardiac silhouette. Loop recorder projects over LEFT heart. Calcified tortuous aorta. Pulmonary vascularity normal. Lungs emphysematous but clear. No pleural effusion or pneumothorax. Bones demineralized. IMPRESSION: Enlargement of cardiac silhouette. Emphysematous changes without infiltrate. Electronically Signed   By: Ulyses SouthwardMark  Boles M.D.   On: 09/22/2015 09:31   Ct Head Wo Contrast  09/22/2015  CLINICAL DATA:  Altered mental status. EXAM: CT HEAD WITHOUT CONTRAST TECHNIQUE: Contiguous axial images were obtained from the base of the skull through the vertex without intravenous contrast. COMPARISON:  06/10/2015 FINDINGS: Ventricles, cisterns and other CSF spaces are within normal. Old infarct over the central right frontoparietal region extending inferiorly into the medial right temporal region. Minimal chronic ischemic microvascular disease. No evidence of focal mass, mass effect, shift of midline structures or acute hemorrhage. No evidence of acute infarction. Remaining bones and soft tissues are within normal. IMPRESSION: No acute intracranial findings. Minimal chronic ischemic microvascular disease and old central right-sided infarct. Electronically Signed   By: Elberta Fortisaniel  Boyle M.D.   On: 09/22/2015 11:08    My personal review of EKG: Sinus bradycardia,  nonspecific ST abnormalities, QT C is normal   Assessment & Plan  Principal Problem:   Acute encephalopathy Active Problems:   Hypercalcemia   Troponin level elevated   Acute renal failure (ARF) (HCC)   DNR (do not resuscitate)   Palliative care status   Hypokalemia   Loss of weight   Acute encephalopathy Likely multifactorial: hypercalcemia, dehydration, and underlying dementia  CT head is negative for acute change, UA does not show infection, chest x-ray negative for acute changes Will gently rehydrate, and then give both IV fluids and Lasix in order to reduce serum calcium. Unfortunately her problems will likely recur within she leaves the hospital if she continues not to eat or drink.  Acute renal failure (3.18 up from 1.44 baseline) Secondary to poor oral intake and chlorthalidone use.  She received 3L of IVF in the ER.  Will continue on 100 ml of IVF per hour x 18 hour.  Recheck bmet in am.  UA does not appear positive for infection  Hypercalcemia (13.8) Secondary to poor oral intake and chlorthalidone use. Hold chlorthalidone, give IV fluids and IV Lasix. Could consider IV bisphosphonate if her serum calcium does not improve.  Hypokalemia (2.8) Due to poor by mouth intake and diuretic use Hold chlorthalidone. Check serum mag. Supplement potassium  Elevated troponin EDP discussed with cardiology. Cardiology felt this was related to her elevated creatinine. EKG does not show signs of ST changes. The patient is not complaining of chest pain. Given her trend towards comfort care we will not cycle troponins.  Weight loss 06/18/15 she weighed 157 pounds. Today she weighs 91 pounds.  Patient has become anorexic post stroke. Will place on ensure supplementation and give comfort feedings. Speech therapy has been consulted to determine the best texture diet  Bradycardia Mild.  Will hold atenolol.    Recent CVA With residual left-sided weakness. Continue Plavix and  statin.   Palliative medicine Family's emphasis is on comfort  and quality of life. They have requested a palliative medicine consult and are interested in hospice support. Patient has been anorexic, not wanting to take medications or food. Would not force oral medications. Will offer comfort feedings. Palliative medicine consulted for goals of care. Social work consulted for possible hospice services in the patient's home.    Consultants Called:  Palliative medicine  Family Communication:   Daughter and friend at bedside  Code Status:  DO NOT RESUSCITATE  Condition:  Guarded  Potential Disposition: To home when acute encephalopathy has improved.  Time spent in minutes : [address]   [facility] Algis Downs,  [place] on 09/22/2015 at 1:11 PM Between 7am to 7pm - Pager - [phone] After 7pm go to [email or web address] - password [email or web address] And look for the night coverage person covering me after hours     I have examined the patient, reviewed the chart and discussed the case with Algis Downs, PA. I have modified the above note and agree with assessment and plan as outlined above.   Calvert Cantor, MD

## 2015-09-22 NOTE — ED Notes (Signed)
Notified Dr Silverio LayYao of critical CA 13.6.

## 2015-09-22 NOTE — Evaluation (Signed)
SLP Cancellation Note  Patient Details Name: Adriana Middleton MRN: [id number] DOB: 1929/12/31   Cancelled treatment:       Reason Eval/Treat Not Completed: Other (comment) (per RN pt with poor attention today, needing to be cleaned up currently, will reattempt evaluation at a later time, RN reports pt took small amount of water without coughing earlier today)   [name], MS [facility] SLP [phone]

## 2015-09-23 DIAGNOSIS — N189 Chronic kidney disease, unspecified: Secondary | ICD-10-CM

## 2015-09-23 DIAGNOSIS — R634 Abnormal weight loss: Secondary | ICD-10-CM

## 2015-09-23 DIAGNOSIS — E876 Hypokalemia: Secondary | ICD-10-CM

## 2015-09-23 DIAGNOSIS — G9340 Encephalopathy, unspecified: Secondary | ICD-10-CM

## 2015-09-23 DIAGNOSIS — N179 Acute kidney failure, unspecified: Principal | ICD-10-CM

## 2015-09-23 LAB — BASIC METABOLIC PANEL
Anion gap: 10 (ref 5–15)
BUN: 48 mg/dL — ABNORMAL HIGH (ref 6–20)
CO2: 24 mmol/L (ref 22–32)
Calcium: 11.4 mg/dL — ABNORMAL HIGH (ref 8.9–10.3)
Chloride: 106 mmol/L (ref 101–111)
Creatinine, Ser: 2.32 mg/dL — ABNORMAL HIGH (ref 0.44–1.00)
GFR calc Af Amer: 21 mL/min — ABNORMAL LOW (ref >60)
GFR calc non Af Amer: 18 mL/min — ABNORMAL LOW (ref >60)
Glucose, Bld: 75 mg/dL (ref 65–99)
Potassium: 3.1 mmol/L — ABNORMAL LOW (ref 3.5–5.1)
Sodium: 140 mmol/L (ref 135–145)

## 2015-09-23 LAB — CBC
HCT: 37.7 % (ref 36.0–46.0)
Hemoglobin: 12.9 g/dL (ref 12.0–15.0)
MCH: 31.7 pg (ref 26.0–34.0)
MCHC: 34.2 g/dL (ref 30.0–36.0)
MCV: 92.6 fL (ref 78.0–100.0)
PLATELETS: 197 10*3/uL (ref 150–400)
RBC: 4.07 MIL/uL (ref 3.87–5.11)
RDW: 12.9 % (ref 11.5–15.5)
WBC: 11.4 10*3/uL — ABNORMAL HIGH (ref 4.0–10.5)

## 2015-09-23 MED ORDER — ACETAMINOPHEN 325 MG PO TABS: 650.0000 mg | ORAL_TABLET | Freq: Four times a day (QID) | ORAL | Status: DC | PRN

## 2015-09-23 NOTE — Consult Note (Signed)
Consultation Note Date: 09/23/2015   Patient Name: Adriana Middleton  DOB: 1930/09/04  MRN: [id number]  Age / Sex: 81 y.o., female  PCP: [name], MD Referring Physician: Elease Etienne, MD  Reason for Consultation: Disposition and Establishing goals of care    Clinical Assessment/Narrative: [name] is a 81 year old female with a PMH of Hypertension, COPD, depression, and primary hyperparathyroidism and recent CVA with resultant left side weakness who presented with increased confusion, hallucinations and anorexia.  She was found to be in acute renal failure, hypercalcemic and dehydrated.  She lives with her daughter and her daughter requested a palliative care consult. [name] Armando Reichert reports that she moved in to help care for her mother about 7 years ago.  Kaitlynne's husband passed away about 12 years ago and after marian's own children were grown she decided to move in to help her mother.  Armando Reichert does have a brother and sister who live out of state and have not been assisting in [place] care.  Armando Reichert reports that her mother was very independent and even drove herself short distances around town until 1 month prior to her CVA (July 2016).  She also reports that she had overall been in very good health and reports she only took a cholesterol pill, half a blood pressure pill (Tenoretic) and a daily aspirin until her stroke.  She notes that after the stroke she was placed on multiple medications and she is now taking a full blood pressure pill.  She notes that since the stroke she has been more dependent on her care and has had a steep function decline.  She reports that she has had decreased PO intake and increasing dementia.  Specifically for the past several days preceding this hospitalization she had very poor oral intake and would occasionally cry out for her help only to not remember what she wanted when her  daughter arrived.  She notes that she does have some assistance from a care giver during the work week but has been caring for her by herself at night.  She does admit this has been getting more difficult.  Contacts/Participants in Discussion: [name] Primary Decision Maker: [name]  Relationship to Patient Daughter HCPOA: yes   patient has 2 other children who live out of state.   SUMMARY OF RECOMMENDATIONS - Would avoid thiazide diuretics going forward given her primary hyperthyroidism. - Minimize medications as below - Short trial of HHPT to see if patient is able to regain strength and appetite with medication minimization - If not progressing with home healthcare then referral to home hospice.   Code Status/Advance Care Planning: DNR    Code Status Orders        Start     Ordered   09/22/15 1252  Do not attempt resuscitation (DNR)   Continuous    Question Answer Comment  In the event of cardiac or respiratory ARREST Do not call a "code blue"   In the event of cardiac or respiratory ARREST Do not perform Intubation, CPR, defibrillation or ACLS   In the event of cardiac or respiratory ARREST Use medication by any route, position, wound care, and other measures to relive pain and suffering. May use oxygen, suction and manual treatment of airway obstruction as needed for comfort.      09/22/15 1251      Other Directives:Other  Symptom Management:   D/C Ibuprofen (AKI), pain seems well controlled.  Palliative Prophylaxis:   Bowel Regimen  Additional Recommendations (Limitations, Scope, Preferences):  Minimize Medications   Daughter would like to reduce medications, please consider D/C of Atenolol/Chlorthalidone (bradycardia, hx of hypercalcemia, poor PO intake, D/C of Plavix (increased bruising) ( may consider change back to Aspirin 81mg  for stroke prevention), can consider D/C of atorvastatin (limited life expectancy)  Psycho-social/Spiritual:  Support  System: Fair Desire for further Chaplaincy support:no Additional Recommendations: Education on Hospice  Prognosis: Unable to determine  Discharge Planning: Home with HHPT, if not progressing daughter will get referral to hospice from PCP   Chief Complaint/ Primary Diagnoses: Present on Admission:  . Troponin level elevated . Acute renal failure (ARF) (HCC) . Hypercalcemia . Acute encephalopathy . DNR (do not resuscitate) . Hypokalemia . Loss of weight  I have reviewed the medical record, interviewed the patient and family, and examined the patient. The following aspects are pertinent.  Past Medical History  Diagnosis Date  . DEPRESSION 07/08/2007  . CATARACTS, BILATERAL 04/18/2008  . HYPERTENSION 07/08/2007  . COPD 04/18/2008  . GERD 07/08/2007  . ARTHRITIS, LEFT HIP 04/18/2008  . Unspecified disorder of parathyroid gland 11/14/2010   Social History   Social History  . Marital Status: Widowed    Spouse Name: N/A  . Number of Children: N/A  . Years of Education: N/A   Social History Main Topics  . Smoking status: Former Games developermoker  . Smokeless tobacco: Never Used     Comment: quit 15 yrs ago.    . Alcohol Use: No  . Drug Use: None  . Sexual Activity: Not Asked   Other Topics Concern  . None   Social History Narrative   Family History  Problem Relation Age of Onset  . Diabetes Other   . Hypertension Other   . Cancer Other     prostate  . Heart disease Other    Scheduled Meds: . [COMPLETED] sodium chloride   Intravenous STAT  . atorvastatin  20 mg Oral Daily  . buPROPion  100 mg Oral BID  . citalopram  10 mg Oral Daily  . clopidogrel  75 mg Oral Daily  . feeding supplement (ENSURE ENLIVE)  237 mL Oral Q24H  . heparin  5,000 Units Subcutaneous 3 times per day   Continuous Infusions:  PRN Meds:.acetaminophen **OR** acetaminophen, alum & mag hydroxide-simeth, diphenhydrAMINE, hydrALAZINE, ondansetron **OR** ondansetron (ZOFRAN) IV, polyethylene glycol Medications  Prior to Admission:  Prior to Admission medications   Medication Sig Start Date End Date Taking? Authorizing Provider  atenolol-chlorthalidone (TENORETIC) 50-25 MG per tablet TAKE 1 TABLET BY MOUTH DAILY. 12/31/14  Yes Roderick PeeJeffrey A Todd, MD  atorvastatin (LIPITOR) 20 MG tablet Take 1 tablet (20 mg total) by mouth daily. 07/19/15  Yes Micki RileyPramod S Sethi, MD  buPROPion (WELLBUTRIN SR) 100 MG 12 hr tablet Take 1 tablet (100 mg total) by mouth 2 (two) times daily. 07/19/15  Yes Micki RileyPramod S Sethi, MD  clopidogrel (PLAVIX) 75 MG tablet Take 1 tablet (75 mg total) by mouth daily. 07/19/15  Yes Micki RileyPramod S Sethi, MD  diphenhydrAMINE (BENADRYL) 25 mg capsule Take 25 mg by mouth every 6 (six) hours as needed for itching or allergies.  06/21/15  Yes Historical Provider, MD  ENSURE PLUS (ENSURE PLUS) LIQD Take 237 mLs by mouth daily. 06/24/15  Yes Historical Provider, MD  fluticasone (FLOVENT DISKUS) 50 MCG/BLIST diskus inhaler Inhale 1 puff into the lungs daily. 06/21/15  Yes Historical Provider, MD  ibuprofen (ADVIL,MOTRIN) 200 MG tablet Take 400 mg by mouth daily as needed (pain).   Yes  Historical Provider, MD   Allergies  Allergen Reactions  . Codeine Itching  . Simvastatin Itching   CBC:    Component Value Date/Time   WBC 11.4* 09/23/2015 0300   HGB 12.9 09/23/2015 0300   HCT 37.7 09/23/2015 0300   PLT 197 09/23/2015 0300   MCV 92.6 09/23/2015 0300   NEUTROABS 7.6 09/22/2015 0823   LYMPHSABS 1.0 09/22/2015 0823   MONOABS 1.0 09/22/2015 0823   EOSABS 0.1 09/22/2015 0823   BASOSABS 0.0 09/22/2015 0823   Comprehensive Metabolic Panel:    Component Value Date/Time   NA 140 09/23/2015 0300   K 3.1* 09/23/2015 0300   CL 106 09/23/2015 0300   CO2 24 09/23/2015 0300   BUN 48* 09/23/2015 0300   CREATININE 2.32* 09/23/2015 0300   GLUCOSE 75 09/23/2015 0300   CALCIUM 11.4* 09/23/2015 0300   CALCIUM 11.6* 11/07/2010 2208   AST 56* 09/22/2015 0823   ALT 31 09/22/2015 0823   ALKPHOS 67 09/22/2015 0823   BILITOT  1.1 09/22/2015 0823   PROT 7.6 09/22/2015 0823   ALBUMIN 3.7 09/22/2015 0823    Review of Systems  Unable to perform ROS   Physical Exam Frail lady NAD Diminished bases S1S2 Does not open eyes, does not verbalize much  Vital Signs: BP 143/54 mmHg  Pulse 59  Temp(Src) 97.7 F (36.5 C) (Axillary)  Resp 18  Ht  (1.575 m)  Wt 41.277 kg (91 lb)  BMI 16.64 kg/m2  SpO2 100% SpO2: Last BM Date: 09/26/15  O2 Device:SpO2: 100 % O2 Flow Rate: .O2 Flow Rate (L/min): 2 L/min Intake/output summary:   Intake/Output Summary (Last 24 hours) at 09/23/15 1447 Last data filed at 09/23/15 1300  Gross per 24 hour  Intake    120 ml  Output   1600 ml  Net  -1480 ml   LBM:  BMP Latest Ref Rng 09/23/2015 09/22/2015 09/22/2015  Glucose 65 - 99 mg/dL 75 89 161(W)  BUN 6 - 20 mg/dL 96(E) 45(W) 09(W)  Creatinine 0.44 - 1.00 mg/dL 1.19(J) 4.78(G) 9.56(O)  Sodium 135 - 145 mmol/L 140 137 137  Potassium 3.5 - 5.1 mmol/L 3.1(L) 3.1(L) 2.8(L)  Chloride 101 - 111 mmol/L 106 102 94(L)  CO2 22 - 32 mmol/L Calcium 8.9 - 10.3 mg/dL 11.4(H) 11.0(H) 13.6(HH)    Baseline Weight: Weight: 41.277 kg (91 lb) Most recent weight: Weight: 41.277 kg (91 lb)     PPS 20% Palliative Assessment/Data:  Flowsheet Rows        Most Recent Value   Intake Tab    Referral Department  Hospitalist   Unit at Time of Referral  Cardiac/Telemetry Unit   Palliative Care Primary Diagnosis  Neurology   Date Notified  09/22/15   Palliative Care Type  New Palliative care   Reason for referral  Clarify Goals of Care   Date of Admission  09/22/15   Date first seen by Palliative Care  09/23/15   # of days Palliative referral response time  1 Day(s)   # of days IP prior to Palliative referral  0   Clinical Assessment    Psychosocial & Spiritual Assessment    Palliative Care Outcomes       Additional Data Reviewed: Recent Labs     09/22/15  0823  09/22/15  1451  09/23/15  0300  WBC  9.7   --   11.4*    HGB  15.5*   --   12.9  PLT  239   --   197  NA  137  137  140  BUN  65*  53*  48*  CREATININE  3.18*  2.38*  2.32*    Time In: 1200 Time Out: 1300 Time Total: 60 Greater than 50%  of this time was spent counseling and coordinating care related to the above assessment and plan.  Signed by: Rosalin Hawking, MD [id number] Rosalin Hawking, MD  09/23/2015, 2:47 PM  Please contact Palliative Medicine Team phone at [phone] for questions and concerns.

## 2015-09-23 NOTE — Evaluation (Signed)
Clinical/Bedside Swallow Evaluation Patient Details  Name: Adriana Middleton MRN: [id number] Date of Birth: 03-02-1930  Today's Date: 09/23/2015 Time: SLP Start Time (ACUTE ONLY): 40980926 SLP Stop Time (ACUTE ONLY): 0935 SLP Time Calculation (min) (ACUTE ONLY): 9 min  Past Medical History:  Past Medical History  Diagnosis Date  . DEPRESSION 07/08/2007  . CATARACTS, BILATERAL 04/18/2008  . HYPERTENSION 07/08/2007  . COPD 04/18/2008  . GERD 07/08/2007  . ARTHRITIS, LEFT HIP 04/18/2008  . Unspecified disorder of parathyroid gland 11/14/2010   Past Surgical History:  Past Surgical History  Procedure Laterality Date  . Colostomy      partial  . Ep implantable device N/A 06/18/2015    Procedure: Loop Recorder Insertion;  Surgeon: Marinus MawGregg W Taylor, MD;  Location: [facility]VASIVE [facility];  Service: Cardiovascular;  Laterality: N/A;  . Tee without cardioversion N/A 06/18/2015    Procedure: TRANSESOPHAGEAL ECHOCARDIOGRAM (TEE);  Surgeon: Chrystie NoseKenneth C Hilty, MD;  [facility];  Service: Cardiovascular;  Laterality: N/A;   HPI:  Adriana Middleton is an 81 y.o. female, who is a retired CounsellorR environmental services employee at [employer] - with COPD, hypertension, depression, and a recent stroke that left her with left-sided weakness. Pt admitted with AMS and anorexia with 66 pound weight loss since stroke in July.   Assessment / Plan / Recommendation Clinical Impression  Pt's oral phase is limited due to current mentation, with mild oral holding with solid textures. She has reduced desire for PO intake as well as poor sustained attention to bolus trials for mastication of soft solids. Pharyngeal phase appears to occur in a timely manner without overt indicators for aspiration. Would modify diet to Dys 2 textures, continuing thin liquids, to allow for safer PO intake. Will continue to follwo for tolerance and possible advancement as mentation clears.    Aspiration Risk  Mild    Diet Recommendation Dysphagia 2  (Fine chop);Thin   Medication Administration: Crushed with puree Compensations: Minimize environmental distractions;Slow rate;Small sips/bites    Other  Recommendations Oral Care Recommendations: Oral care BID   Follow Up Recommendations   tba    Frequency and Duration min 2x/week  2 weeks   Pertinent Vitals/Pain n/a    SLP Swallow Goals     Swallow Study Prior Functional Status       General Other Pertinent Information: Adriana Middleton is an 81 y.o. 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 Pt admitted with AMS and anorexia with 66 pound weight loss since stroke in July. Type of Study: Bedside swallow evaluation Previous Swallow Assessment: none in chart Diet Prior to this Study: Regular;Thin liquids Temperature Spikes Noted: No Respiratory Status: Room air History of Recent Intubation: No Behavior/Cognition: Alert;Cooperative;Confused;Requires cueing Self-Feeding Abilities: Needs assist Patient Positioning: Upright in bed Baseline Vocal Quality: Normal    Oral/Motor/Sensory Function     Ice Chips Ice chips: Not tested   Thin Liquid Thin Liquid: Within functional limits Presentation: Straw    Nectar Thick Nectar Thick Liquid: Not tested   Honey Thick Honey Thick Liquid: Not tested   Puree Puree: Impaired Presentation: Spoon Oral Phase Functional Implications: Oral holding   Solid    Solid: Impaired Presentation: Spoon Oral Phase Functional Implications: Oral holding      Maxcine HamLaura Paiewonsky, M.A. CCC-SLP [phone]  Maxcine Hamaiewonsky, Heena Woodbury 09/23/2015,9:47 AM

## 2015-09-23 NOTE — Discharge Summary (Addendum)
Physician Discharge Summary  ELSYE MCCOLLISTER PQZ:[id number] DOB: 1930-09-18 DOA: 09/22/2015  PCP: Elyn Peers, MD  Admit date: 09/22/2015 Discharge date: 09/23/2015  Time spent: Greater than 30 minutes  Recommendations for Outpatient Follow-up:  1. Dr. Lucianne Lei, PCP as needed 2. Home health physical therapy  Discharge Diagnoses:  Principal Problem:   Acute encephalopathy Active Problems:   Troponin level elevated   Acute renal failure (ARF) (HCC)   Hypercalcemia   DNR (do not resuscitate)   Palliative care status   Hypokalemia   Loss of weight   Discharge Condition: Improved & Stable  Diet recommendation: Comfort feeds of choice  Filed Weights   09/22/15 0810  Weight: 41.277 kg (91 lb)    History of present illness:  81 y.o. female, who is a retired Publishing rights manager at [employer] - with COPD, hypertension, depression, and a recent stroke 3 months ago that left her with left-sided weakness. She was brought to the emergency room by her children due to increased confusion, hallucinations, and anorexia. History was gathered from her daughter (medical attorney) she reports that over the past week to 10 days her mother has stopped eating, stopped getting out of bed, and has begun to hallucinate. She has seen snakes on her cane, and she has been speaking with her deceased husband. The daughter reports that her mother "wants to go".  In the emergency department the patient was found to be bradycardic, hypokalemia (2.8), hypercalcemic (corrected Ca 13.8) and have an elevated troponin of 0.16. She has lost 66 pounds since her stroke in July 2016.  Hospital Course:   Acute encephalopathy Likely multifactorial: hypercalcemia, dehydration, acute kidney injury complicating underlying dementia-possibly vascular CT head is negative for acute change, UA does not show infection, chest x-ray negative for acute changes No significant change in her creatinine or  calcium. Pleasantly confused but not agitated.  Acute renal failure on chronic kidney disease stage III (3.18 up from 1.44 baseline) Secondary to poor oral intake and chlorthalidone use. She received 3L of IVF in the ER.  UA does not appear positive for infection Despite IV fluid hydration, creatinine has only marginally improved from 2.38-2.32. - Discussed extensively with patient's daughter on 10/24: She indicated that her goal is only of comfort and does not wish to pursue aggressive care and does not wish for patient to have any further lab draws or IV fluids.  Hypercalcemia (13.8) Secondary to poor oral intake and chlorthalidone use. Hold chlorthalidone, gave IV fluids and IV Lasix. Not significantly changed.  Hypokalemia (2.8) Due to poor by mouth intake and diuretic use Hold chlorthalidone. Magnesium normal. No significant change in hypokalemia. Does not want aggressive care including supplementation.  Elevated troponin EDP discussed with cardiology. Cardiology felt this was related to her elevated creatinine. EKG does not show signs of ST changes. The patient is not complaining of chest pain. Given her trend towards comfort care we will not cycle troponins.  Weight loss 06/18/15 she weighed 157 pounds. Today she weighs 91 pounds. Patient has become anorexic post stroke. Will place on ensure supplementation and give comfort feedings. Speech therapy has been consulted to determine the best texture diet  Bradycardia Mild. Will hold atenolol.   Recent CVA With residual left-sided weakness. Continue Plavix and statin.  Essential hypertension - Atenolol-chlorthalidone discontinued secondary to bradycardia and metabolic derangements as above. Blood pressures reasonably controlled off medications.  Depression - Continue home medications. Her current presentation does not seem primarily secondary to depression.  No reported suicidal or homicidal ideations.  Palliative  medicine Family's emphasis is on comfort and quality of life. They have requested a palliative medicine consult and are interested in hospice support. Patient has been anorexic, not wanting to take medications or food. Would not force oral medications. Will offer comfort feedings. Palliative medicine consulted for goals of care. Social work consulted for possible hospice services in the patient's home. - Discussed extensively with patient's daughter on 10/24: She indicated that patient was driving a month prior to her stroke in July 2016. Since then she has had a steep decline in both her physical and mental status as indicated above. It was explained to her that even if her acute metabolic issues were corrected at this time, she has propensity for recurrence of all of the secondary to poor oral intake. Choices were offered regarding continued aggressive care versus comfort care. Daughter opted for comfort oriented care and recommended no further labs, medications nonessential to comfort, IV fluids or interventions.  - Palliative care team met with patient and at this time family has opted for short trial of home health PT to see if patient is able to regain strength and appetite with medication minimization. However if she does not progress with home health care than referable to home hospice. Daughter is agreeable to taking patient home today.   DVT Prophylaxis: Subcutaneous heparin Code Status: DO NOT RESUSCITATE Family Communication: Discussed with patient's daughter Ms. Zachery Dakins on 10/24. Disposition Plan: DC home with home health PT   Consultants:  Palliative Care Medicine  Procedures:  None  Antibiotics:  None    Discharge Exam:  Complaints: Pleasantly confused. No specific complaints reported.  Filed Vitals:   09/23/15 0835 09/23/15 0838 09/23/15 0848 09/23/15 1404  BP:  151/53  143/54  Pulse:  59  59  Temp:   97.6 F (36.4 C) 97.7 F (36.5 C)  TempSrc:   Axillary  Axillary  Resp:    18  Height:      Weight:      SpO2: 100%   100%    General exam: Pleasant elderly lady lying comfortably in bed. Respiratory system: Clear. No increased work of breathing. Cardiovascular system: S1 & S2 heard, RRR. No JVD, murmurs, gallops, clicks or pedal edema. Telemetry: Baseline artifacts. Sinus bradycardia in the 50s-sinus rhythm in the 60s. Gastrointestinal system: Abdomen is nondistended, soft and nontender. Normal bowel sounds heard. Central nervous system: Alert and not oriented. Follows some instructions. No focal neurological deficits. Extremities: Symmetric 5 x 5 power  Discharge Instructions      Discharge Instructions    Call MD for:  difficulty breathing, headache or visual disturbances    Complete by:  As directed      Call MD for:  extreme fatigue    Complete by:  As directed      Call MD for:  hives    Complete by:  As directed      Call MD for:  persistant dizziness or light-headedness    Complete by:  As directed      Call MD for:  persistant nausea and vomiting    Complete by:  As directed      Call MD for:  severe uncontrolled pain    Complete by:  As directed      Call MD for:  temperature >100.4    Complete by:  As directed      Diet general    Complete by:  As directed  Increase activity slowly    Complete by:  As directed             Medication List    STOP taking these medications        atenolol-chlorthalidone 50-25 MG tablet  Commonly known as:  TENORETIC     ibuprofen 200 MG tablet  Commonly known as:  ADVIL,MOTRIN      TAKE these medications        acetaminophen 325 MG tablet  Commonly known as:  TYLENOL  Take 2 tablets (650 mg total) by mouth every 6 (six) hours as needed for mild pain, moderate pain, fever or headache (or Fever >/= 101).     atorvastatin 20 MG tablet  Commonly known as:  LIPITOR  Take 1 tablet (20 mg total) by mouth daily.     buPROPion 100 MG 12 hr tablet  Commonly known as:   WELLBUTRIN SR  Take 1 tablet (100 mg total) by mouth 2 (two) times daily.     clopidogrel 75 MG tablet  Commonly known as:  PLAVIX  Take 1 tablet (75 mg total) by mouth daily.     diphenhydrAMINE 25 mg capsule  Commonly known as:  BENADRYL  Take 25 mg by mouth every 6 (six) hours as needed for itching or allergies.     ENSURE PLUS Liqd  Take 237 mLs by mouth daily.     fluticasone 50 MCG/BLIST diskus inhaler  Commonly known as:  FLOVENT DISKUS  Inhale 1 puff into the lungs daily.          The results of significant diagnostics from this hospitalization (including imaging, microbiology, ancillary and laboratory) are listed below for reference.    Significant Diagnostic Studies: Dg Chest 2 View  09/22/2015  CLINICAL DATA:  Will not eat or drink for 1 week, hypertension, COPD EXAM: CHEST  2 VIEW COMPARISON:  03/30/2011 FINDINGS: Enlargement of cardiac silhouette. Loop recorder projects over LEFT heart. Calcified tortuous aorta. Pulmonary vascularity normal. Lungs emphysematous but clear. No pleural effusion or pneumothorax. Bones demineralized. IMPRESSION: Enlargement of cardiac silhouette. Emphysematous changes without infiltrate. Electronically Signed   By: Lavonia Dana M.D.   On: 09/22/2015 09:31   Ct Head Wo Contrast  09/22/2015  CLINICAL DATA:  Altered mental status. EXAM: CT HEAD WITHOUT CONTRAST TECHNIQUE: Contiguous axial images were obtained from the base of the skull through the vertex without intravenous contrast. COMPARISON:  06/10/2015 FINDINGS: Ventricles, cisterns and other CSF spaces are within normal. Old infarct over the central right frontoparietal region extending inferiorly into the medial right temporal region. Minimal chronic ischemic microvascular disease. No evidence of focal mass, mass effect, shift of midline structures or acute hemorrhage. No evidence of acute infarction. Remaining bones and soft tissues are within normal. IMPRESSION: No acute intracranial  findings. Minimal chronic ischemic microvascular disease and old central right-sided infarct. Electronically Signed   By: Marin Olp M.D.   On: 09/22/2015 11:08    Microbiology: Recent Results (from the past 240 hour(s))  Urine culture     Status: None (Preliminary result)   Collection Time: 09/22/15  8:38 AM  Result Value Ref Range Status   Specimen Description URINE, CATHETERIZED  Final   Special Requests NONE  Final   Culture CULTURE REINCUBATED FOR BETTER GROWTH  Final   Report Status PENDING  Incomplete     Labs: Basic Metabolic Panel:  Recent Labs Lab 09/22/15 0823 09/22/15 1224 09/22/15 1451 09/23/15 0300  NA 137  --  137 140  K 2.8*  --  3.1* 3.1*  CL 94*  --  102 106  CO2 31  --  25 24  GLUCOSE 110*  --  89 75  BUN 65*  --  53* 48*  CREATININE 3.18*  --  2.38* 2.32*  CALCIUM 13.6*  --  11.0* 11.4*  MG  --  1.9  --   --    Liver Function Tests:  Recent Labs Lab 09/22/15 0823  AST 56*  ALT 31  ALKPHOS 67  BILITOT 1.1  PROT 7.6  ALBUMIN 3.7   No results for input(s): LIPASE, AMYLASE in the last 168 hours. No results for input(s): AMMONIA in the last 168 hours. CBC:  Recent Labs Lab 09/22/15 0823 09/23/15 0300  WBC 9.7 11.4*  NEUTROABS 7.6  --   HGB 15.5* 12.9  HCT 44.4 37.7  MCV 92.9 92.6  PLT 239 197   Cardiac Enzymes: No results for input(s): CKTOTAL, CKMB, CKMBINDEX, TROPONINI in the last 168 hours. BNP: BNP (last 3 results) No results for input(s): BNP in the last 8760 hours.  ProBNP (last 3 results) No results for input(s): PROBNP in the last 8760 hours.  CBG: No results for input(s): GLUCAP in the last 168 hours.    Signed:  Vernell Leep, MD, FACP, FHM. [facility] Pager [phone]  If 7PM-7AM, please contact night-coverage [email or web address] Password [email or web address] 09/23/2015, 2:44 PM

## 2015-09-23 NOTE — Evaluation (Signed)
Physical Therapy Evaluation Patient Details Name: Elige Kovelyn E Koska MRN: [id number] DOB: August 14, 1930 Today's Date: 09/23/2015   History of Present Illness  Ranae Plumbervelyn Deharo is a 81 y.o. 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 She was brought to the emergency room by her children due to increased confusion, hallucinations, and anorexia. History was gathered from her daughter (medical attorney) she reports that over the past week to 10 days her mother has stopped eating, stopped getting out of bed, and has begun to hallucinate.   Clinical Impression  Pt admitted with above diagnosis. Pt currently with functional limitations due to the deficits listed below (see PT Problem List). Pt will benefit from skilled PT to increase their independence and safety with mobility to allow discharge to the venue listed below.  Pt's family is considering palliative care with possibility of hospice. At this time, daughter would like PT to see if pt will work to improve strength.  She is also interested in HHPT for education for her and caregiver on how to A pt at home.  Recommend hospital bed for home.     Follow Up Recommendations Home health PT    Equipment Recommendations  Hospital bed    Recommendations for Other Services       Precautions / Restrictions Restrictions Weight Bearing Restrictions: No      Mobility  Bed Mobility Overal bed mobility: Needs Assistance Bed Mobility: Supine to Sit     Supine to sit: Mod assist        Transfers Overall transfer level: Needs assistance   Transfers: Sit to/from Stand;Stand Pivot Transfers Sit to Stand: Max assist Stand pivot transfers: Max assist       General transfer comment: Attempted to perform SPT with RW, but pt unable to take any steps.  Did a bear hug type transfer, and pt attempting to A but placing R hand  on recliner hand rail closer to her which made transfer more difficulty because she had difficulty following instructions to let go and grab other hand rail of recliner.  3 sit to stands from bed.  Pt with BM on gown.  Changed gown.  Ambulation/Gait                Stairs            Wheelchair Mobility    Modified Rankin (Stroke Patients Only)       Balance Overall balance assessment: Needs assistance   Sitting balance-Leahy Scale: Fair       Standing balance-Leahy Scale: Zero Standing balance comment: Weakness in L side with increased lean to L in standing                             Pertinent Vitals/Pain Pain Assessment: Faces Faces Pain Scale: Hurts a little bit Pain Location: "all over" Pain Intervention(s): Limited activity within patient's tolerance    Home Living Family/patient expects to be discharged to:: Private residence Living Arrangements: Children Available Help at Discharge: Family;Personal care attendant;Available 24 hours/day Type of Home: House Home Access: Stairs to enter Entrance Stairs-Rails: Left;Right;Can reach both Entrance Stairs-Number of Steps: 3 Home Layout: One level Home Equipment: Walker - 2 wheels;Cane - single point;Shower seat;Wheelchair - manual      Prior Function Level of Independence: Needs assistance   Gait / Transfers Assistance Needed: Prior  to a week and a half ago, pt was able to ambulate household distances with RW slowly and manage stairs with S.           Hand Dominance        Extremity/Trunk Assessment   Upper Extremity Assessment: Generalized weakness           Lower Extremity Assessment: Generalized weakness (hx of L sided weakness due to CVA)      Cervical / Trunk Assessment: Kyphotic  Communication   Communication: No difficulties  Cognition     Overall Cognitive Status: Impaired/Different from baseline Area of Impairment: Orientation;Following  commands;Safety/judgement;Awareness Orientation Level: Person     Following Commands: Follows one step commands inconsistently Safety/Judgement: Decreased awareness of safety Awareness: Intellectual   General Comments: Pt not very interactive with PT or daughter    General Comments General comments (skin integrity, edema, etc.): Pt with 1 mitt on upon arrival.  Removed and left off due to daughter present and pt not fidgety while in recliner.  Daughter aware to alert staff if she needs them or when she leaves so pt can be returned to bed.  Discussion with daughter on role of PT as they are considering palliative care with possibility of hospice.  At this time, she would like PT intervention, even if just for education for her and for caregiver. She is awaiting palliative meeting with MD today.    Exercises        Assessment/Plan    PT Assessment Patient needs continued PT services  PT Diagnosis Generalized weakness   PT Problem List Decreased strength;Decreased activity tolerance;Decreased balance;Decreased mobility  PT Treatment Interventions Functional mobility training;Therapeutic activities;Therapeutic exercise;Patient/family education;Balance training   PT Goals (Current goals can be found in the Care Plan section) Acute Rehab PT Goals Patient Stated Goal: To try and see if pt can work with PT and for education for herself and caregiver. PT Goal Formulation: With family Time For Goal Achievement: 10/07/15 Potential to Achieve Goals: Fair    Frequency Min 2X/week   Barriers to discharge        Co-evaluation               End of Session Equipment Utilized During Treatment: Gait belt Activity Tolerance: Patient tolerated treatment well Patient left: in chair;with family/visitor present Nurse Communication: Mobility status         Time: 9147-8295 PT Time Calculation (min) (ACUTE ONLY): 31 min   Charges:   PT Evaluation $Initial PT Evaluation Tier I: 1  Procedure PT Treatments $Therapeutic Activity: 8-22 mins   PT G Codes:        Mohmmad Saleeby LUBECK 09/23/2015, 10:59 AM

## 2015-09-23 NOTE — Progress Notes (Signed)
[facility] on behalf of [facility].  Augusto GambleJody Anaise Sterbenz,LCSW Evening/ED Coverage [phone]

## 2015-09-23 NOTE — Progress Notes (Signed)
Utilization review completed.  

## 2015-09-23 NOTE — Progress Notes (Signed)
Loop recorder 

## 2015-09-23 NOTE — Progress Notes (Addendum)
CSW received consult for possible hospice referral- per palliative physician plan if for patient to return home with home health services and see if she will progress  No CSW needs at this time- CSW signing off  Merlyn LotJenna Holoman, [facility] Clinical Social Worker [phone]

## 2015-09-24 LAB — URINE CULTURE: Culture: >=100000

## 2015-09-24 NOTE — Care Management Note (Signed)
Case Management Note Donn PieriniKristi Azelyn Batie RN, BSN Unit 2W-Case Manager [phone]  Patient Details  Name: Adriana Middleton MRN: [id number] Date of Birth: [birth year]  Subjective/Objective:   Pt admitted with acute encephalopathy                 Action/Plan: PTA pt lived at home with daughter, has cane, RW, shower chair in the home, daughter is considering comfort care- but would like to see if pt can improve with HH therapy first- PC mtg was done with daughter- [facility]- pt was discharged prior to CM speaking with daughter and arranging- call made to daughter [name] this am 10/25 to speak with her regarding HH arrangements- orders for HH-RN/PT/aide- per conversation with Daughter- [place] [facility]- daughter states she does not have a preference- [facility] and daughter agreeable- [facility]aren [facility]-RN/PT/aide-   Expected Discharge Date:  09/23/15               Expected Discharge Plan:  Home w Home Health Services  In-House Referral:  Clinical Social Work  Discharge planning Services  CM Consult  Post Acute Care Choice:  Home Health Choice offered to:  Adult Children  DME Arranged:  N/A DME Agency:  NA  HH Arranged:  RN, PT, Nurse's Aide HH Agency:  [facility]  Status of Service:  Completed, signed off  Medicare Important Message Given:    Date Medicare IM Given:    Medicare IM give by:    Date Additional Medicare IM Given:    Additional Medicare Important Message give by:     If discussed at Long Length of Stay Meetings, dates discussed:    Additional Comments:  Darrold SpanWebster, Lacole Komorowski Hall, RN 09/24/2015, 9:53 AM

## 2015-09-25 DIAGNOSIS — I69354 Hemiplegia and hemiparesis following cerebral infarction affecting left non-dominant side: Secondary | ICD-10-CM | POA: Diagnosis not present

## 2015-09-25 DIAGNOSIS — I10 Essential (primary) hypertension: Secondary | ICD-10-CM | POA: Diagnosis not present

## 2015-09-25 DIAGNOSIS — J449 Chronic obstructive pulmonary disease, unspecified: Secondary | ICD-10-CM | POA: Diagnosis not present

## 2015-09-25 DIAGNOSIS — L89311 Pressure ulcer of right buttock, stage 1: Secondary | ICD-10-CM | POA: Diagnosis not present

## 2015-09-26 ENCOUNTER — Encounter: Payer: Self-pay | Admitting: Cardiology

## 2015-09-26 ENCOUNTER — Other Ambulatory Visit: Payer: Self-pay | Admitting: Neurology

## 2015-09-27 DIAGNOSIS — L89311 Pressure ulcer of right buttock, stage 1: Secondary | ICD-10-CM | POA: Diagnosis not present

## 2015-09-27 DIAGNOSIS — J449 Chronic obstructive pulmonary disease, unspecified: Secondary | ICD-10-CM | POA: Diagnosis not present

## 2015-09-27 DIAGNOSIS — I69354 Hemiplegia and hemiparesis following cerebral infarction affecting left non-dominant side: Secondary | ICD-10-CM | POA: Diagnosis not present

## 2015-09-27 DIAGNOSIS — I10 Essential (primary) hypertension: Secondary | ICD-10-CM | POA: Diagnosis not present

## 2015-10-01 DIAGNOSIS — L89311 Pressure ulcer of right buttock, stage 1: Secondary | ICD-10-CM | POA: Diagnosis not present

## 2015-10-01 DIAGNOSIS — J449 Chronic obstructive pulmonary disease, unspecified: Secondary | ICD-10-CM | POA: Diagnosis not present

## 2015-10-01 DIAGNOSIS — I69354 Hemiplegia and hemiparesis following cerebral infarction affecting left non-dominant side: Secondary | ICD-10-CM | POA: Diagnosis not present

## 2015-10-01 DIAGNOSIS — I10 Essential (primary) hypertension: Secondary | ICD-10-CM | POA: Diagnosis not present

## 2015-10-02 DIAGNOSIS — I69354 Hemiplegia and hemiparesis following cerebral infarction affecting left non-dominant side: Secondary | ICD-10-CM | POA: Diagnosis not present

## 2015-10-02 DIAGNOSIS — I10 Essential (primary) hypertension: Secondary | ICD-10-CM | POA: Diagnosis not present

## 2015-10-02 DIAGNOSIS — L89311 Pressure ulcer of right buttock, stage 1: Secondary | ICD-10-CM | POA: Diagnosis not present

## 2015-10-02 DIAGNOSIS — J449 Chronic obstructive pulmonary disease, unspecified: Secondary | ICD-10-CM | POA: Diagnosis not present

## 2015-10-03 DIAGNOSIS — J449 Chronic obstructive pulmonary disease, unspecified: Secondary | ICD-10-CM | POA: Diagnosis not present

## 2015-10-03 DIAGNOSIS — I69354 Hemiplegia and hemiparesis following cerebral infarction affecting left non-dominant side: Secondary | ICD-10-CM | POA: Diagnosis not present

## 2015-10-03 DIAGNOSIS — I10 Essential (primary) hypertension: Secondary | ICD-10-CM | POA: Diagnosis not present

## 2015-10-03 DIAGNOSIS — L89311 Pressure ulcer of right buttock, stage 1: Secondary | ICD-10-CM | POA: Diagnosis not present

## 2015-10-04 ENCOUNTER — Encounter: Payer: Self-pay | Admitting: Cardiology

## 2015-10-04 DIAGNOSIS — J449 Chronic obstructive pulmonary disease, unspecified: Secondary | ICD-10-CM | POA: Diagnosis not present

## 2015-10-04 DIAGNOSIS — L89311 Pressure ulcer of right buttock, stage 1: Secondary | ICD-10-CM | POA: Diagnosis not present

## 2015-10-04 DIAGNOSIS — I69354 Hemiplegia and hemiparesis following cerebral infarction affecting left non-dominant side: Secondary | ICD-10-CM | POA: Diagnosis not present

## 2015-10-04 DIAGNOSIS — I10 Essential (primary) hypertension: Secondary | ICD-10-CM | POA: Diagnosis not present

## 2015-10-07 DIAGNOSIS — I10 Essential (primary) hypertension: Secondary | ICD-10-CM | POA: Diagnosis not present

## 2015-10-07 DIAGNOSIS — I69354 Hemiplegia and hemiparesis following cerebral infarction affecting left non-dominant side: Secondary | ICD-10-CM | POA: Diagnosis not present

## 2015-10-07 DIAGNOSIS — L89311 Pressure ulcer of right buttock, stage 1: Secondary | ICD-10-CM | POA: Diagnosis not present

## 2015-10-07 DIAGNOSIS — J449 Chronic obstructive pulmonary disease, unspecified: Secondary | ICD-10-CM | POA: Diagnosis not present

## 2015-10-08 DIAGNOSIS — J449 Chronic obstructive pulmonary disease, unspecified: Secondary | ICD-10-CM | POA: Diagnosis not present

## 2015-10-08 DIAGNOSIS — I69354 Hemiplegia and hemiparesis following cerebral infarction affecting left non-dominant side: Secondary | ICD-10-CM | POA: Diagnosis not present

## 2015-10-08 DIAGNOSIS — L89311 Pressure ulcer of right buttock, stage 1: Secondary | ICD-10-CM | POA: Diagnosis not present

## 2015-10-08 DIAGNOSIS — I10 Essential (primary) hypertension: Secondary | ICD-10-CM | POA: Diagnosis not present

## 2015-10-08 LAB — CUP PACEART REMOTE DEVICE CHECK: MDC IDC SESS DTM: 20161020213558

## 2015-10-08 NOTE — Progress Notes (Signed)
Carelink summary report received. Battery status OK. Normal device function. No new symptom episodes, tachy episodes, brady, or pause episodes. No new AF episodes. Monthly summary reports and ROV with GT in 06/2016. 

## 2015-10-09 DIAGNOSIS — I10 Essential (primary) hypertension: Secondary | ICD-10-CM | POA: Diagnosis not present

## 2015-10-09 DIAGNOSIS — L89311 Pressure ulcer of right buttock, stage 1: Secondary | ICD-10-CM | POA: Diagnosis not present

## 2015-10-09 DIAGNOSIS — I69354 Hemiplegia and hemiparesis following cerebral infarction affecting left non-dominant side: Secondary | ICD-10-CM | POA: Diagnosis not present

## 2015-10-09 DIAGNOSIS — J449 Chronic obstructive pulmonary disease, unspecified: Secondary | ICD-10-CM | POA: Diagnosis not present

## 2015-10-10 ENCOUNTER — Encounter: Payer: Self-pay | Admitting: Cardiology

## 2015-10-11 DIAGNOSIS — I10 Essential (primary) hypertension: Secondary | ICD-10-CM | POA: Diagnosis not present

## 2015-10-11 DIAGNOSIS — L89311 Pressure ulcer of right buttock, stage 1: Secondary | ICD-10-CM | POA: Diagnosis not present

## 2015-10-11 DIAGNOSIS — I69354 Hemiplegia and hemiparesis following cerebral infarction affecting left non-dominant side: Secondary | ICD-10-CM | POA: Diagnosis not present

## 2015-10-11 DIAGNOSIS — J449 Chronic obstructive pulmonary disease, unspecified: Secondary | ICD-10-CM | POA: Diagnosis not present

## 2015-10-12 DIAGNOSIS — I10 Essential (primary) hypertension: Secondary | ICD-10-CM | POA: Diagnosis not present

## 2015-10-12 DIAGNOSIS — E860 Dehydration: Secondary | ICD-10-CM | POA: Diagnosis not present

## 2015-10-12 DIAGNOSIS — F329 Major depressive disorder, single episode, unspecified: Secondary | ICD-10-CM | POA: Diagnosis not present

## 2015-10-14 ENCOUNTER — Ambulatory Visit (INDEPENDENT_AMBULATORY_CARE_PROVIDER_SITE_OTHER): Payer: Medicare Other | Admitting: Adult Health

## 2015-10-14 ENCOUNTER — Encounter: Payer: Self-pay | Admitting: Adult Health

## 2015-10-14 VITALS — BP 128/76 | HR 80 | Ht 62.0 in | Wt 100.0 lb

## 2015-10-14 DIAGNOSIS — R413 Other amnesia: Secondary | ICD-10-CM

## 2015-10-14 DIAGNOSIS — Z8673 Personal history of transient ischemic attack (TIA), and cerebral infarction without residual deficits: Secondary | ICD-10-CM

## 2015-10-14 DIAGNOSIS — I10 Essential (primary) hypertension: Secondary | ICD-10-CM | POA: Diagnosis not present

## 2015-10-14 DIAGNOSIS — E785 Hyperlipidemia, unspecified: Secondary | ICD-10-CM | POA: Diagnosis not present

## 2015-10-14 NOTE — Patient Instructions (Addendum)
Continue Plavix Blood pressure goal less than 130/90 Cholesterol LDL less than 70 Hemoglobin A1c less than 6.5% Check blood work today and fax to Dr. Parke Simmers.  Continue Celexa for the next month- if no benefit then consider increasing celexa or wellbutrin but not staying on both medications. Taking these medications together can increase risk of serotonin syndrome.  If you have any strokelike symptoms 911 immediately If your symptoms worsen or you develop new symptoms please let us know.  Memantine Tablets What is this medicine? MEMANTINE (MEM an teen) is used to treat dementia caused by Alzheimer's disease. This medicine may be used for other purposes; ask your health care provider or pharmacist if you have questions. What should I tell my health care provider before I take this medicine? They need to know if you have any of these conditions: -difficulty passing urine -kidney disease -liver disease -seizures -an unusual or allergic reaction to memantine, other medicines, foods, dyes, or preservatives -pregnant or trying to get pregnant -breast-feeding How should I use this medicine? Take this medicine by mouth with a glass of water. Follow the directions on the prescription label. You may take this medicine with or without food. Take your doses at regular intervals. Do not take your medicine more often than directed. Continue to take your medicine even if you feel better. Do not stop taking except on the advice of your doctor or health care professional. Talk to your pediatrician regarding the use of this medicine in children. Special care may be needed. Overdosage: If you think you have taken too much of this medicine contact a poison control center or emergency room at once. NOTE: This medicine is only for you. Do not share this medicine with others. What if I miss a dose? If you miss a dose, take it as soon as you can. If it is almost time for your next dose, take only that dose. Do not  take double or extra doses. If you do not take your medicine for several days, contact your health care provider. Your dose may need to be changed. What may interact with this medicine? -acetazolamide -amantadine -cimetidine -dextromethorphan -dofetilide -hydrochlorothiazide -ketamine -metformin -methazolamide -quinidine -ranitidine -sodium bicarbonate -triamterene This list may not describe all possible interactions. Give your health care provider a list of all the medicines, herbs, non-prescription drugs, or dietary supplements you use. Also tell them if you smoke, drink alcohol, or use illegal drugs. Some items may interact with your medicine. What should I watch for while using this medicine? Visit your doctor or health care professional for regular checks on your progress. Check with your doctor or health care professional if there is no improvement in your symptoms or if they get worse. You may get drowsy or dizzy. Do not drive, use machinery, or do anything that needs mental alertness until you know how this drug affects you. Do not stand or sit up quickly, especially if you are an older patient. This reduces the risk of dizzy or fainting spells. Alcohol can make you more drowsy and dizzy. Avoid alcoholic drinks. What side effects may I notice from receiving this medicine? Side effects that you should report to your doctor or health care professional as soon as possible: -allergic reactions like skin rash, itching or hives, swelling of the face, lips, or tongue -agitation or a feeling of restlessness -depressed mood -dizziness -hallucinations -redness, blistering, peeling or loosening of the skin, including inside the mouth -seizures -vomiting Side effects that usually do not require  medical attention (report to your doctor or health care professional if they continue or are bothersome): -constipation -diarrhea -headache -nausea -trouble sleeping This list may not describe  all possible side effects. Call your doctor for medical advice about side effects. You may report side effects to FDA at 1-800-FDA-1088. Where should I keep my medicine? Keep out of the reach of children. Store at room temperature between 15 degrees and 30 degrees C (59 degrees and 86 degrees F). Throw away any unused medicine after the expiration date. NOTE: This sheet is a summary. It may not cover all possible information. If you have questions about this medicine, talk to your doctor, pharmacist, or health care provider.    2016, Elsevier/Gold Standard. (2013-09-04 14:10:42) Donepezil tablets What is this medicine? DONEPEZIL (doe NEP e zil) is used to treat mild to moderate dementia caused by Alzheimer's disease. This medicine may be used for other purposes; ask your health care provider or pharmacist if you have questions. What should I tell my health care provider before I take this medicine? They need to know if you have any of these conditions: -asthma or other lung disease -difficulty passing urine -head injury -heart disease -history of irregular heartbeat -liver disease -seizures (convulsions) -stomach or intestinal disease, ulcers or stomach bleeding -an unusual or allergic reaction to donepezil, other medicines, foods, dyes, or preservatives -pregnant or trying to get pregnant -breast-feeding How should I use this medicine? Take this medicine by mouth with a glass of water. Follow the directions on the prescription label. You may take this medicine with or without food. Take this medicine at regular intervals. This medicine is usually taken before bedtime. Do not take it more often than directed. Continue to take your medicine even if you feel better. Do not stop taking except on your doctor's advice. If you are taking the 23 mg donepezil tablet, swallow it whole; do not cut, crush, or chew it. Talk to your pediatrician regarding the use of this medicine in children. Special  care may be needed. Overdosage: If you think you have taken too much of this medicine contact a poison control center or emergency room at once. NOTE: This medicine is only for you. Do not share this medicine with others. What if I miss a dose? If you miss a dose, take it as soon as you can. If it is almost time for your next dose, take only that dose, do not take double or extra doses. What may interact with this medicine? Do not take this medicine with any of the following medications: -certain medicines for fungal infections like itraconazole, fluconazole, posaconazole, and voriconazole -cisapride -dextromethorphan; quinidine -dofetilide -dronedarone -pimozide -quinidine -thioridazine -ziprasidone This medicine may also interact with the following medications: -antihistamines for allergy, cough and cold -atropine -bethanechol -carbamazepine -certain medicines for bladder problems like oxybutynin, tolterodine -certain medicines for Parkinson's disease like benztropine, trihexyphenidyl -certain medicines for stomach problems like dicyclomine, hyoscyamine -certain medicines for travel sickness like scopolamine -dexamethasone -ipratropium -NSAIDs, medicines for pain and inflammation, like ibuprofen or naproxen -other medicines for Alzheimer's disease -other medicines that prolong the QT interval (cause an abnormal heart rhythm) -phenobarbital -phenytoin -rifampin, rifabutin or rifapentine This list may not describe all possible interactions. Give your health care provider a list of all the medicines, herbs, non-prescription drugs, or dietary supplements you use. Also tell them if you smoke, drink alcohol, or use illegal drugs. Some items may interact with your medicine. What should I watch for while using this medicine? Visit  your doctor or health care professional for regular checks on your progress. Check with your doctor or health care professional if your symptoms do not get  better or if they get worse. You may get drowsy or dizzy. Do not drive, use machinery, or do anything that needs mental alertness until you know how this drug affects you. What side effects may I notice from receiving this medicine? Side effects that you should report to your doctor or health care professional as soon as possible: -allergic reactions like skin rash, itching or hives, swelling of the face, lips, or tongue -changes in vision -feeling faint or lightheaded, falls -problems with balance -redness, blistering, peeling or loosening of the skin, including inside the mouth -slow heartbeat, or palpitations -stomach pain -unusual bleeding or bruising, red or purple spots on the skin -vomiting -weight loss Side effects that usually do not require medical attention (report to your doctor or health care professional if they continue or are bothersome): -diarrhea, especially when starting treatment -headache -indigestion or heartburn -loss of appetite -muscle cramps -nausea This list may not describe all possible side effects. Call your doctor for medical advice about side effects. You may report side effects to FDA at 1-800-FDA-1088. Where should I keep my medicine? Keep out of reach of children. Store at room temperature between 15 and 30 degrees C (59 and 86 degrees F). Throw away any unused medicine after the expiration date. NOTE: This sheet is a summary. It may not cover all possible information. If you have questions about this medicine, talk to your doctor, pharmacist, or health care provider.    2016, Elsevier/Gold Standard. (2014-06-28 07:51:52)

## 2015-10-14 NOTE — Progress Notes (Signed)
I have read the note, and I agree with the clinical assessment and plan.  Zen Felling KEITH   

## 2015-10-14 NOTE — Progress Notes (Signed)
PATIENT: Adriana Middleton DOB: May 01, 1930  REASON FOR VISIT: follow up- history of stroke HISTORY FROM: patient  HISTORY OF PRESENT ILLNESS: Adriana Middleton is a 81 year old female with a history of stroke. She returns today for follow-up. She is currently taking aspirin and Plavix for stroke prevention. She reports that she is tolerating these medications well. Her primary care is managing her hypertension and hyperlipidemia. Blood pressure has been under relatively good control. The patient is on Lipitor. She states that she was also put on Celexa in addition to the Wellbutrin to help with her depression. Her daughter states that her primary care asked for our input regarding increasing the Celexa. Daughter states that she has good and bad days in regards to her mood. She states that she uses a walker in her home but a wheelchair when she is out. She denies any falls. Denies any trouble with her swallowing with the exception that she tends to pocket food on the left side. The patient still feels weaker on the left side. She does have a Loop recorder however atrial fibrillation has not been reported. Daughter reports that her memory has been affected since the stroke. She states that she has a hard time remembering people's names. She often talks about her deceased husband as if he is alive. She currently lives at home with her daughter. She requires some assistance with ADLs. She returns today for an evaluation.  HISTORY 07/19/15 (SETHI):Adriana Middleton is seen today for the first office follow-up visit following hospital admission for stroke in June 2016. Adriana Middleton is an 81 y.o. female with a past medical history significant for HTN , MVP, depression, COPD, brought in by family for further evaluation of left sided weakness, imbalance, fall, recent right brain infarct. Patient was seen in this ED on 7/11 with complains of left face weakness, slurred speech, and left sided weakness (LKW 06/09/2015, time unknown). MRI  brain demonstrated multifocal areas of acute right MCA distribution infarcts as well as findings suggestive of markedly attenuated flow in the cavernous right internal carotid artery and no definite flow in the right middle cerebral artery or at the right MCA bifurcation. Patient refused to be admitted to the hospital and left against medical advice. She has been taking aspirin..Adriana Middleton returns to the ED today 06/16/2015 complaining of poor balance needing to use a walker in the house, fall at least x 1 time, and increasing weakness. She states that she cannot tolerate the weakness anymore and needs something to be done. She complains of left-sided weakness. Daughter at the bedside said that her speech is actually improved. Denies HA, vertigo, double vision, difficulty swallowing, confusion, slurred speech, language or vision impairment. Serologies significant for Na 31, K 3.2, Cr 1.45. UA without obvious infection. At baseline highly functional able to cook clean and do all of her ADLs and was still driving until 3 weeks ago. She lives with her daughter and has done so for the past 5 years. Patient was not administered TPA secondary to late presentation. She was admitted for further evaluation and treatment. MRI scan of the brain was motion degraded and showed right MCA territory infarct similar to the previous MRI from one week ago. The moderate changes of chronic microvascular ischemia. MRA of the bed brain was also motion degraded but showed occluded right middle cerebral artery and multiple stenosis in the intracranial vessels versus pulsation artifact. CT angiogram of the head and neck showed severe stenosis at the origin of  both vertebral arteries. Carotid Doppler showed 40-59% ICA stenosis bilaterally in the neck. Transthoracic echo showed normal ejection fraction without cardiac source of embolism. Transesophageal echo showed dilated left atrial appendage but no cardiac source for embolism or PFO. There  was grade 3 atheroma of the distal ascending aorta. LDL cholesterol was elevated at 138. Hemoglobin A1c was 5.6. Patient had loop recorder placed but so far atrial fibrillation has not been found. Patient is at home living with her daughter. She is able to walk with a walker. She has not had any falls. She's had a flare up of her depression and has crying spells and is not sleeping well. She gets up in the middle of the night. She has been treated with antidepressive and some the past but not for long time. She is tolerating aspirin and Plavix without significant bleeding and only minor bruising. She is also tolerating Lipitor without side effects. Patient has an appointment with her primary physician next week   REVIEW OF SYSTEMS: Out of a complete 14 system review of symptoms, the patient complains only of the following symptoms, and all other reviewed systems are negative.  Appetite change, chills, fatigue, unexpected weight change, blurred vision, constipation, urgency, urine decrease, frequent waking, daytime sleepiness, walking difficulty, agitation, confusion, decreased concentration, depression, nervous/anxious, weakness, memory loss  ALLERGIES: Allergies  Allergen Reactions  . Codeine Itching  . Simvastatin Itching    HOME MEDICATIONS: Outpatient Prescriptions Prior to Visit  Medication Sig Dispense Refill  . acetaminophen (TYLENOL) 325 MG tablet Take 2 tablets (650 mg total) by mouth every 6 (six) hours as needed for mild pain, moderate pain, fever or headache (or Fever >/= 101).    Marland Kitchen atorvastatin (LIPITOR) 20 MG tablet Take 1 tablet (20 mg total) by mouth daily.    Marland Kitchen buPROPion (WELLBUTRIN SR) 100 MG 12 hr tablet TAKE 1 TABLET (100 MG TOTAL) BY MOUTH 2 (TWO) TIMES DAILY. 30 tablet 1  . clopidogrel (PLAVIX) 75 MG tablet Take 1 tablet (75 mg total) by mouth daily.    . diphenhydrAMINE (BENADRYL) 25 mg capsule Take 25 mg by mouth every 6 (six) hours as needed for itching or allergies.       Marland Kitchen ENSURE PLUS (ENSURE PLUS) LIQD Take 237 mLs by mouth daily.    . fluticasone (FLOVENT DISKUS) 50 MCG/BLIST diskus inhaler Inhale 1 puff into the lungs daily.     No facility-administered medications prior to visit.    PAST MEDICAL HISTORY: Past Medical History  Diagnosis Date  . DEPRESSION 07/08/2007  . CATARACTS, BILATERAL 04/18/2008  . HYPERTENSION 07/08/2007  . COPD 04/18/2008  . GERD 07/08/2007  . ARTHRITIS, LEFT HIP 04/18/2008  . 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 Colostomy      partial  . Ep implantable device N/A 06/18/2015    Procedure: Loop Recorder Insertion;  Surgeon: Marinus Maw, MD;  Location: [facility] INVASIVE [facility];  Service: Cardiovascular;  Laterality: N/A;  . Tee without cardioversion N/A 06/18/2015    Procedure: TRANSESOPHAGEAL ECHOCARDIOGRAM (TEE);  Surgeon: Chrystie Nose, MD;  [facility];  Service: Cardiovascular;  Laterality: N/A;    FAMILY HISTORY: Family History  Problem Relation Age of Onset  . Diabetes Other   . Hypertension Other   . Cancer Other     prostate  . Heart disease Other     SOCIAL HISTORY: Social History   Social History  .  Marital Status: Widowed    Spouse Name: N/A  . Number of Children: N/A  . Years of Education: N/A   Occupational History  . Not on file.   Social History Main Topics  . Smoking status: Former Games developer  . Smokeless tobacco: Never Used     Comment: quit 15 yrs ago.    . Alcohol Use: No  . Drug Use: Not on file  . Sexual Activity: Not on file   Other Topics Concern  . Not on file   Social History Narrative      PHYSICAL EXAM  Filed Vitals:   10/14/15 0740  BP: 128/76  Pulse: 80  Height: 5\' 2"  (1.575 m)  Weight: 100 lb (45.36 kg)   Body mass index is 18.29 kg/(m^2).  MMSE - Mini Mental State Exam 10/14/2015  Orientation to time 0  Orientation to Place 3  Registration 3  Attention/  Calculation 0  Recall 0  Language- name 2 objects 2  Language- repeat 1  Language- follow 3 step command 3  Language- read & follow direction 1  Write a sentence 0  Copy design 0  Total score 13     Generalized: Well developed, in no acute distress   Neurological examination  Mentation: Alert oriented to time, place, history taking. Follows all commands speech and language fluent Cranial nerve II-XII:  Extraocular movements were full, visual field were full on confrontational test. Facial sensation and strength were normal.Head turning and shoulder shrug  were normal and symmetric. Motor: The motor testing reveals 5 over 5 strength in the upper extremities and 4/5 strength in the lower extremities/ Good symmetric motor tone is noted throughout.  Sensory: Sensory testing is intact to soft touch on all 4 extremities. No evidence of extinction is noted.  Coordination: Cerebellar testing reveals good finger-nose-finger bilaterally and some difficulty with heel-to-shin on the left side.  Gait and station: Patient is in a wheelchair. She did not bring her walker with her today. Reflexes: Deep tendon reflexes are symmetric and normal bilaterally.   DIAGNOSTIC DATA (LABS, IMAGING, TESTING) - I reviewed patient records, labs, notes, testing and imaging myself where available.  Lab Results  Component Value Date   WBC 11.4* 09/23/2015   HGB 12.9 09/23/2015   HCT 37.7 09/23/2015   MCV 92.6 09/23/2015   PLT 197 09/23/2015      Component Value Date/Time   NA 140 09/23/2015 0300   K 3.1* 09/23/2015 0300   CL 106 09/23/2015 0300   CO2 24 09/23/2015 0300   GLUCOSE 75 09/23/2015 0300   BUN 48* 09/23/2015 0300   CREATININE 2.32* 09/23/2015 0300   CALCIUM 11.4* 09/23/2015 0300   CALCIUM 11.6* 11/07/2010 2208   PROT 7.6 09/22/2015 0823   ALBUMIN 3.7 09/22/2015 0823   AST 56* 09/22/2015 0823   ALT 31 09/22/2015 0823   ALKPHOS 67 09/22/2015 0823   BILITOT 1.1 09/22/2015 0823   GFRNONAA  18* 09/23/2015 0300   GFRAA 21* 09/23/2015 0300   Lab Results  Component Value Date   CHOL 209* 06/17/2015   HDL 52 06/17/2015   LDLCALC 138* 06/17/2015   LDLDIRECT 173.0 04/11/2007   TRIG 96 06/17/2015   CHOLHDL 4.0 06/17/2015   Lab Results  Component Value Date   HGBA1C 5.6 06/17/2015   No results found for: [id number] Lab Results  Component Value Date   TSH 0.94 12/06/2012      ASSESSMENT AND PLAN 81 y.o. year old female  has a past medical  history of DEPRESSION (07/08/2007); CATARACTS, BILATERAL (04/18/2008); HYPERTENSION (07/08/2007); COPD (04/18/2008); GERD (07/08/2007); ARTHRITIS, LEFT HIP (04/18/2008); and Unspecified disorder of parathyroid gland (11/14/2010). here with:  1. Stroke 2. Hypertension 3. Hyperlipidemia 4. Memory   Overall the patient is doing well. She will continue on Plavix for stroke prevention. Patient should maintain strict control of her blood pressure with goal less than 130/90, cholesterol LDL less than 70 and hemoglobin A1c less than 6.5%. The patient has not had any blood work since her stroke. Her daughter states they tried to take blood work at her last visit with her primary care but was unable to get any blood when they stuck her. I will check blood work today. The patient was placed on Celexa in addition to the Wellbutrin. I advised that she will stay on Wellbutrin for the next 4 weeks if there is no benefit then she should discuss with her primary care about increasing either Celexa or Wellbutrin and discontinuing the other medication. I advised that being on both of these medications can increase her risk for serotonin syndrome. The daughter verbalized understanding. The patient's memory score is 13/30. Some of her memory loss could be due to depression as well. I have discussed with the patient and her daughter about starting memory medication such as Aricept or Namenda. I would recommend starting with Aricept first. Daughter will let us know if she  would like to start this. Patient advised that if she has any strokelike symptoms she should call 911 immediately. She will follow-up in 4-5 months or sooner if needed.    Butch PennyMegan Denesia Donelan, MSN, NP-C 10/14/2015, 7:39 AM [facility] [address], [address] [place], [place] [address] [phone]

## 2015-10-17 ENCOUNTER — Encounter: Payer: Self-pay | Admitting: Cardiology

## 2015-10-21 ENCOUNTER — Encounter: Payer: Medicare Other | Admitting: *Deleted

## 2015-10-31 ENCOUNTER — Encounter: Payer: Self-pay | Admitting: Cardiology

## 2015-11-08 ENCOUNTER — Encounter: Payer: Self-pay | Admitting: Cardiology

## 2015-11-11 DIAGNOSIS — I10 Essential (primary) hypertension: Secondary | ICD-10-CM | POA: Diagnosis not present

## 2015-11-11 DIAGNOSIS — F329 Major depressive disorder, single episode, unspecified: Secondary | ICD-10-CM | POA: Diagnosis not present

## 2015-11-11 DIAGNOSIS — I639 Cerebral infarction, unspecified: Secondary | ICD-10-CM | POA: Diagnosis not present

## 2015-11-11 DIAGNOSIS — Z0000 Encounter for general adult medical examination without abnormal findings: Secondary | ICD-10-CM | POA: Diagnosis not present

## 2015-11-14 ENCOUNTER — Telehealth: Payer: Self-pay | Admitting: Adult Health

## 2015-11-14 ENCOUNTER — Ambulatory Visit: Payer: Medicare Other | Admitting: Neurology

## 2015-11-14 ENCOUNTER — Encounter: Payer: Self-pay | Admitting: Cardiology

## 2015-11-14 NOTE — Telephone Encounter (Signed)
[name] called to advise of the medications recommended for dementia, they prefer to try Memantine.

## 2015-11-15 ENCOUNTER — Other Ambulatory Visit: Payer: Self-pay | Admitting: Family Medicine

## 2015-11-15 ENCOUNTER — Ambulatory Visit
Admission: RE | Admit: 2015-11-15 | Discharge: 2015-11-15 | Disposition: A | Discharge status: Home / Self Care | Payer: Medicare Other | Source: Ambulatory Visit | Attending: Family Medicine | Admitting: Family Medicine

## 2015-11-15 DIAGNOSIS — S62617A Displaced fracture of proximal phalanx of left little finger, initial encounter for closed fracture: Secondary | ICD-10-CM | POA: Diagnosis not present

## 2015-11-15 DIAGNOSIS — M79642 Pain in left hand: Secondary | ICD-10-CM

## 2015-11-15 DIAGNOSIS — S62615A Displaced fracture of proximal phalanx of left ring finger, initial encounter for closed fracture: Secondary | ICD-10-CM | POA: Diagnosis not present

## 2015-11-15 NOTE — Telephone Encounter (Signed)
I called and left voicemail. I will try again Monday

## 2015-11-18 NOTE — Telephone Encounter (Signed)
I called and left another voicemail.

## 2015-11-21 ENCOUNTER — Encounter: Payer: Self-pay | Admitting: Cardiology

## 2015-11-21 DIAGNOSIS — S62647A Nondisplaced fracture of proximal phalanx of left little finger, initial encounter for closed fracture: Secondary | ICD-10-CM | POA: Diagnosis not present

## 2015-11-21 DIAGNOSIS — S62645A Nondisplaced fracture of proximal phalanx of left ring finger, initial encounter for closed fracture: Secondary | ICD-10-CM | POA: Diagnosis not present

## 2015-11-26 ENCOUNTER — Other Ambulatory Visit: Payer: Self-pay | Admitting: Neurology

## 2015-12-05 DIAGNOSIS — S62645D Nondisplaced fracture of proximal phalanx of left ring finger, subsequent encounter for fracture with routine healing: Secondary | ICD-10-CM | POA: Diagnosis not present

## 2015-12-05 DIAGNOSIS — S62647D Nondisplaced fracture of proximal phalanx of left little finger, subsequent encounter for fracture with routine healing: Secondary | ICD-10-CM | POA: Diagnosis not present

## 2015-12-24 DIAGNOSIS — M130 Polyarthritis, unspecified: Secondary | ICD-10-CM | POA: Diagnosis not present

## 2015-12-24 DIAGNOSIS — I10 Essential (primary) hypertension: Secondary | ICD-10-CM | POA: Diagnosis not present

## 2015-12-27 ENCOUNTER — Other Ambulatory Visit: Payer: Self-pay | Admitting: Family Medicine

## 2015-12-27 ENCOUNTER — Ambulatory Visit
Admission: RE | Admit: 2015-12-27 | Discharge: 2015-12-27 | Disposition: A | Discharge status: Home / Self Care | Payer: Medicare Other | Source: Ambulatory Visit | Attending: Family Medicine | Admitting: Family Medicine

## 2015-12-27 DIAGNOSIS — M25551 Pain in right hip: Secondary | ICD-10-CM

## 2015-12-27 DIAGNOSIS — M160 Bilateral primary osteoarthritis of hip: Secondary | ICD-10-CM | POA: Diagnosis not present

## 2015-12-27 DIAGNOSIS — M25552 Pain in left hip: Principal | ICD-10-CM

## 2016-01-22 ENCOUNTER — Telehealth: Payer: Self-pay

## 2016-01-22 ENCOUNTER — Other Ambulatory Visit: Payer: Self-pay

## 2016-01-22 NOTE — Telephone Encounter (Signed)
Rn call patients daughter if her mom was still taking the bupropion medication one tablet twice a day. Pts daughter stated her mom is only taking it once a day. Pts daughter stated she does not need a refill for the medication. Rn explain per Megan(NP) last note it was discuss that her PCP do future medication management on the bupropion and refills. Pt is schedule to see her PCP the end of March 2017, and she is seeing Megan(NP) on3/16/2017 for a follow up. Rn stated [facility] has sent another [facility] about refills. Rn stated that the refill will be denied because she is taking it once a day.Pts daughter verbalized understanding.

## 2016-02-13 ENCOUNTER — Ambulatory Visit (INDEPENDENT_AMBULATORY_CARE_PROVIDER_SITE_OTHER): Payer: Medicare Other | Admitting: Adult Health

## 2016-02-13 ENCOUNTER — Telehealth: Payer: Self-pay | Admitting: *Deleted

## 2016-02-13 ENCOUNTER — Encounter: Payer: Self-pay | Admitting: Adult Health

## 2016-02-13 VITALS — BP 126/78 | HR 81 | Ht 62.0 in

## 2016-02-13 DIAGNOSIS — Z8673 Personal history of transient ischemic attack (TIA), and cerebral infarction without residual deficits: Secondary | ICD-10-CM | POA: Diagnosis not present

## 2016-02-13 DIAGNOSIS — R413 Other amnesia: Secondary | ICD-10-CM | POA: Diagnosis not present

## 2016-02-13 MED ORDER — MEMANTINE HCL 28 X 5 MG & 21 X 10 MG PO TABS: ORAL_TABLET | ORAL | Status: DC

## 2016-02-13 NOTE — Telephone Encounter (Signed)
Called Den[facility] at [facility] [address] and cancelled the nameda titration pak order.   She would take care of cancelling.

## 2016-02-13 NOTE — Progress Notes (Signed)
PATIENT: Adriana Middleton DOB: [birth year]  REASON FOR VISIT: follow up- stroke, memory HISTORY FROM: patient  HISTORY OF PRESENT ILLNESS: Adriana Middleton is an 82 year old female with a history of stroke. She returns today for follow-up. She continues to take aspirin and Plavix. She is tolerating this medication well. Her primary care manages her blood pressure and cholesterol. At the last visit the patient was having increased trouble with her memory. Her daughter feels that her memory has actually improved. She states that she is now doing puzzles and reading the newspaper again. She is able to complete most ADLs independently. She does require assistance getting in and out of the bathtub. She does require help with preparing her meals. The patient continues to have some residual left-sided weakness from the stroke. She uses a walker when ambulating and uses a wheelchair for longer distances. The patient was hospitalized in October for dehydration. The patient is also having pain in the left groin area especially when flexing or extending at the hip. Her primary care ordered an x-ray of the hip but they have not received the results. She returns today for an evaluation.   HISTORY 10/14/15: Adriana Middleton is a 82 year old female with a history of stroke. She returns today for follow-up. She is currently taking aspirin and Plavix for stroke prevention. She reports that she is tolerating these medications well. Her primary care is managing her hypertension and hyperlipidemia. Blood pressure has been under relatively good control. The patient is on Lipitor. She states that she was also put on Celexa in addition to the Wellbutrin to help with her depression. Her daughter states that her primary care asked for our input regarding increasing the Celexa. Daughter states that she has good and bad days in regards to her mood. She states that she uses a walker in her home but a wheelchair when she is out. She denies any falls.  Denies any trouble with her swallowing with the exception that she tends to pocket food on the left side. The patient still feels weaker on the left side. She does have a Loop recorder however atrial fibrillation has not been reported. Daughter reports that her memory has been affected since the stroke. She states that she has a hard time remembering people's names. She often talks about her deceased husband as if he is alive. She currently lives at home with her daughter. She requires some assistance with ADLs. She returns today for an evaluation.  HISTORY 07/19/15 (SETHI):Adriana Middleton is seen today for the first office follow-up visit following hospital admission for stroke in June 2016. Adriana Middleton is an 82 y.o. female with a past medical history significant for HTN , MVP, depression, COPD, brought in by family for further evaluation of left sided weakness, imbalance, fall, recent right brain infarct. Patient was seen in this ED on 7/11 with complains of left face weakness, slurred speech, and left sided weakness (LKW 06/09/2015, time unknown). MRI brain demonstrated multifocal areas of acute right MCA distribution infarcts as well as findings suggestive of markedly attenuated flow in the cavernous right internal carotid artery and no definite flow in the right middle cerebral artery or at the right MCA bifurcation. Patient refused to be admitted to the hospital and left against medical advice. She has been taking aspirin..Adriana Middleton returns to the ED today 06/16/2015 complaining of poor balance needing to use a walker in the house, fall at least x 1 time, and increasing weakness. She states that she  cannot tolerate the weakness anymore and needs something to be done. She complains of left-sided weakness. Daughter at the bedside said that her speech is actually improved. Denies HA, vertigo, double vision, difficulty swallowing, confusion, slurred speech, language or vision impairment. Serologies significant for Na  31, K 3.2, Cr 1.45. UA without obvious infection. At baseline highly functional able to cook clean and do all of her ADLs and was still driving until 3 weeks ago. She lives with her daughter and has done so for the past 5 years. Patient was not administered TPA secondary to late presentation. She was admitted for further evaluation and treatment. MRI scan of the brain was motion degraded and showed right MCA territory infarct similar to the previous MRI from one week ago. The moderate changes of chronic microvascular ischemia. MRA of the bed brain was also motion degraded but showed occluded right middle cerebral artery and multiple stenosis in the intracranial vessels versus pulsation artifact. CT angiogram of the head and neck showed severe stenosis at the origin of both vertebral arteries. Carotid Doppler showed 40-59% ICA stenosis bilaterally in the neck. Transthoracic echo showed normal ejection fraction without cardiac source of embolism. Transesophageal echo showed dilated left atrial appendage but no cardiac source for embolism or PFO. There was grade 3 atheroma of the distal ascending aorta. LDL cholesterol was elevated at 138. Hemoglobin A1c was 5.6. Patient had loop recorder placed but so far atrial fibrillation has not been found. Patient is at home living with her daughter. She is able to walk with a walker. She has not had any falls. She's had a flare up of her depression and has crying spells and is not sleeping well. She gets up in the middle of the night. She has been treated with antidepressive and some the past but not for long time. She is tolerating aspirin and Plavix without significant bleeding and only minor bruising. She is also tolerating Lipitor without side effects. Patient has an appointment with her primary physician next week   REVIEW OF SYSTEMS: Out of a complete 14 system review of symptoms, the patient complains only of the following symptoms, and all other reviewed systems are  negative.  Cough, joint pain, walking difficulty, depression, frequent waking  ALLERGIES: Allergies  Allergen Reactions  . Codeine Itching  . Simvastatin Itching    HOME MEDICATIONS: Outpatient Prescriptions Prior to Visit  Medication Sig Dispense Refill  . acetaminophen (TYLENOL) 325 MG tablet Take 2 tablets (650 mg total) by mouth every 6 (six) hours as needed for mild pain, moderate pain, fever or headache (or Fever >/= 101).    Marland Kitchen. atorvastatin (LIPITOR) 20 MG tablet Take 1 tablet (20 mg total) by mouth daily.    . citalopram (CELEXA) 10 MG tablet TAKE 1 TABLET BY MOUTH EVERY DAY FOR DEPRESSION  3  . citalopram (CELEXA) 20 MG tablet Take 20 mg by mouth. May increase at later date.    . clopidogrel (PLAVIX) 75 MG tablet Take 1 tablet (75 mg total) by mouth daily.    Marland Kitchen. ENSURE PLUS (ENSURE PLUS) LIQD Take 237 mLs by mouth daily.     No facility-administered medications prior to visit.    PAST MEDICAL HISTORY: Past Medical History  Diagnosis Date  . DEPRESSION 07/08/2007  . CATARACTS, BILATERAL 04/18/2008  . HYPERTENSION 07/08/2007  . COPD 04/18/2008  . GERD 07/08/2007  . ARTHRITIS, LEFT HIP 04/18/2008  . 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 Colostomy      partial  . Ep implantable device N/A 06/18/2015    Procedure: Loop Recorder Insertion;  Surgeon: Marinus Maw, MD;  Location: [facility];  Service: Cardiovascular;  Laterality: N/A;  . Tee without cardioversion N/A 06/18/2015    Procedure: TRANSESOPHAGEAL ECHOCARDIOGRAM (TEE);  Surgeon: Chrystie Nose, MD;  Location: [facility] ENDOSCOPY;  Service: Cardiovascular;  Laterality: N/A;    FAMILY HISTORY: Family History  Problem Relation Age of Onset  . Diabetes Other   . Hypertension Other   . Cancer Other     prostate  . Heart disease Other     SOCIAL HISTORY: Social History   Social History  . Marital Status: Widowed     Spouse Name: N/A  . Number of Children: N/A  . Years of Education: N/A   Occupational History  . Not on file.   Social History Main Topics  . Smoking status: Former Games developer  . Smokeless tobacco: Never Used     Comment: quit 15 yrs ago.    . Alcohol Use: No  . Drug Use: Not on file  . Sexual Activity: Not on file   Other Topics Concern  . Not on file   Social History Narrative      PHYSICAL EXAM  Filed Vitals:   02/13/16 1547  BP: 126/78  Pulse: 81  Height: 5\' 2"  (1.575 m)   There is no weight on file to calculate BMI.   MMSE - Mini Mental State Exam 02/13/2016 10/14/2015  Orientation to time 3 0  Orientation to Place 3 3  Registration 3 3  Attention/ Calculation 0 0  Recall 0 0  Language- name 2 objects 2 2  Language- repeat 1 1  Language- follow 3 step command 3 3  Language- read & follow direction 1 1  Write a sentence 1 0  Copy design 0 0  Total score 17 13     Generalized: Well developed, in no acute distress   Neurological examination  Mentation: Alert . Follows all commands speech and language fluent Cranial nerve II-XII: Pupils were equal round reactive to light. Extraocular movements were full, visual field were full on confrontational test. Facial sensation and strength were normal. Uvula tongue midline. Head turning and shoulder shrug  were normal and symmetric. Motor: The motor testing reveals 5 over 5 strength in all extremities except 4/ 5 strength in the left lower extremity Good symmetric motor tone is noted throughout.  Sensory: Sensory testing is intact to soft touch on all 4 extremities. No evidence of extinction is noted.  Coordination: Cerebellar testing reveals good finger-nose-finger and heel-to-shin bilaterally.  Gait and station: Patient is in a wheelchair today. Reflexes: Deep tendon reflexes are symmetric and normal bilaterally.   DIAGNOSTIC DATA (LABS, IMAGING, TESTING) - I reviewed patient records, labs, notes, testing and imaging  myself where available.  Lab Results  Component Value Date   WBC 11.4* 09/23/2015   HGB 12.9 09/23/2015   HCT 37.7 09/23/2015   MCV 92.6 09/23/2015   PLT 197 09/23/2015      Component Value Date/Time   NA 140 09/23/2015 0300   K 3.1* 09/23/2015 0300   CL 106 09/23/2015 0300   CO2 24 09/23/2015 0300   GLUCOSE 75 09/23/2015 0300   BUN 48* 09/23/2015 0300   CREATININE 2.32* 09/23/2015 0300   CALCIUM 11.4* 09/23/2015 0300   CALCIUM 11.6* 11/07/2010 2208   PROT 7.6 09/22/2015 1610  ALBUMIN 3.7 09/22/2015 0823   AST 56* 09/22/2015 0823   ALT 31 09/22/2015 0823   ALKPHOS 67 09/22/2015 0823   BILITOT 1.1 09/22/2015 0823   GFRNONAA 18* 09/23/2015 0300   GFRAA 21* 09/23/2015 0300   Lab Results  Component Value Date   CHOL 209* 06/17/2015   HDL 52 06/17/2015   LDLCALC 138* 06/17/2015   LDLDIRECT 173.0 04/11/2007   TRIG 96 06/17/2015   CHOLHDL 4.0 06/17/2015   Lab Results  Component Value Date   HGBA1C 5.6 06/17/2015        ASSESSMENT AND PLAN 82 y.o. year old female  has a past medical history of DEPRESSION (07/08/2007); CATARACTS, BILATERAL (04/18/2008); HYPERTENSION (07/08/2007); COPD (04/18/2008); GERD (07/08/2007); ARTHRITIS, LEFT HIP (04/18/2008); and Unspecified disorder of parathyroid gland (11/14/2010). here with:  1. History of stroke 2. Memory disturbance   Overall the patient is doing well. She'll continue on Plavix for stroke prevention. She should maintain strict control of her blood pressure with goal less than 130/90, cholesterol LDL less than 70 and hemoglobin A1c less than 6.5%. The patient's memory score has actually improved today. They are interested in trying Namenda. In the past the patient had abnormal kidney function but has not been able to have any recent blood work. I will check blood work today. Pending the blood work we may consider Namenda or Aricept.  Butch Penny, MSN, NP-C 02/13/2016, 3:40 PM [facility] [address],  [address] [place], [place] [address] [phone]

## 2016-02-13 NOTE — Patient Instructions (Signed)
Continue plavix Blood pressure <130/90 Cholesterol LDL <100 Pending bloodwork we will start namenda or Aricept If your symptoms worsen or you develop new symptoms please let us know.

## 2016-02-13 NOTE — Progress Notes (Signed)
I agree with the above plan 

## 2016-02-14 ENCOUNTER — Telehealth: Payer: Self-pay | Admitting: Cardiology

## 2016-02-14 LAB — COMPREHENSIVE METABOLIC PANEL
ALK PHOS: 91 IU/L (ref 39–117)
ALT: 8 IU/L (ref 0–32)
AST: 16 IU/L (ref 0–40)
Albumin/Globulin Ratio: 1.6 (ref 1.2–2.2)
Albumin: 4.1 g/dL (ref 3.5–4.7)
BUN/Creatinine Ratio: 16 (ref 11–26)
BUN: 26 mg/dL (ref 8–27)
Bilirubin Total: 0.2 mg/dL (ref 0.0–1.2)
CALCIUM: 11.3 mg/dL — AB (ref 8.7–10.3)
CO2: 24 mmol/L (ref 18–29)
CREATININE: 1.59 mg/dL — AB (ref 0.57–1.00)
Chloride: 102 mmol/L (ref 96–106)
GFR calc Af Amer: 34 mL/min/{1.73_m2} — ABNORMAL LOW (ref >59)
GFR, EST NON AFRICAN AMERICAN: 30 mL/min/{1.73_m2} — AB (ref >59)
Globulin, Total: 2.6 g/dL (ref 1.5–4.5)
Glucose: 96 mg/dL (ref 65–99)
Potassium: 5.2 mmol/L (ref 3.5–5.2)
SODIUM: 141 mmol/L (ref 134–144)
TOTAL PROTEIN: 6.7 g/dL (ref 6.0–8.5)

## 2016-02-14 LAB — CBC WITH DIFFERENTIAL/PLATELET
BASOS: 1 %
Basophils Absolute: 0 10*3/uL (ref 0.0–0.2)
EOS (ABSOLUTE): 0.1 10*3/uL (ref 0.0–0.4)
EOS: 2 %
HEMATOCRIT: 34.6 % (ref 34.0–46.6)
Hemoglobin: 11.2 g/dL (ref 11.1–15.9)
IMMATURE GRANS (ABS): 0 10*3/uL (ref 0.0–0.1)
IMMATURE GRANULOCYTES: 0 %
LYMPHS: 19 %
Lymphocytes Absolute: 1 10*3/uL (ref 0.7–3.1)
MCH: 31.4 pg (ref 26.6–33.0)
MCHC: 32.4 g/dL (ref 31.5–35.7)
MCV: 97 fL (ref 79–97)
MONOS ABS: 0.5 10*3/uL (ref 0.1–0.9)
Monocytes: 8 %
NEUTROS PCT: 70 %
Neutrophils Absolute: 4 10*3/uL (ref 1.4–7.0)
PLATELETS: 244 10*3/uL (ref 150–379)
RBC: 3.57 x10E6/uL — AB (ref 3.77–5.28)
RDW: 12.8 % (ref 12.3–15.4)
WBC: 5.6 10*3/uL (ref 3.4–10.8)

## 2016-02-14 NOTE — Telephone Encounter (Signed)
Spoke w/ pt and instructed her to send a manual transmission w/ her home monitor. Pt done this while I was on the phone. Transmission received pt aware.

## 2016-02-17 ENCOUNTER — Ambulatory Visit (INDEPENDENT_AMBULATORY_CARE_PROVIDER_SITE_OTHER): Payer: Medicare Other | Admitting: *Deleted

## 2016-02-17 ENCOUNTER — Telehealth: Payer: Self-pay | Admitting: Adult Health

## 2016-02-17 DIAGNOSIS — I638 Other cerebral infarction: Secondary | ICD-10-CM

## 2016-02-17 DIAGNOSIS — I6389 Other cerebral infarction: Secondary | ICD-10-CM

## 2016-02-17 NOTE — Telephone Encounter (Signed)
I called the patient's daughter. I left a detailed voice message that her lab work had improved and I would rather start Aricept. I have asked that she call back so we can discuss this. She advised me at the last office visit that she has a hard time calling before 5 PM. I will try to give her a call after 5 PM this afternoon.

## 2016-02-17 NOTE — Telephone Encounter (Signed)
I called the patient's daughter and left a message.

## 2016-02-18 MED ORDER — DONEPEZIL HCL 5 MG PO TABS: 5.0000 mg | ORAL_TABLET | Freq: Every day | ORAL | Status: DC

## 2016-02-18 NOTE — Telephone Encounter (Signed)
I called the patient's daughter. I explained that her lab work did show some improvement in her kidney function. I will make sure her lab work is forwarded to her primary care provider. We will start patient on Aricept for her memory. She will begin taken 5 mg at bedtime. I have reviewed the side effects with the daughter. If she is unable to tolerate this medication she was advised to let us know.

## 2016-02-18 NOTE — Telephone Encounter (Signed)
[name] returned your call and states she will be available until 11:55 today(on lunch) @[phone]. If she misses you she will call back.

## 2016-02-18 NOTE — Addendum Note (Signed)
Addended by: Enedina FinnerMILLIKAN, Billy Turvey P on: 02/18/2016 11:47 AM   Modules accepted: Orders

## 2016-02-18 NOTE — Progress Notes (Signed)
Carelink Summary Report / Loop Recorder 

## 2016-02-27 DIAGNOSIS — I10 Essential (primary) hypertension: Secondary | ICD-10-CM | POA: Diagnosis not present

## 2016-02-27 DIAGNOSIS — M130 Polyarthritis, unspecified: Secondary | ICD-10-CM | POA: Diagnosis not present

## 2016-02-27 DIAGNOSIS — E7800 Pure hypercholesterolemia, unspecified: Secondary | ICD-10-CM | POA: Diagnosis not present

## 2016-02-27 DIAGNOSIS — I639 Cerebral infarction, unspecified: Secondary | ICD-10-CM | POA: Diagnosis not present

## 2016-02-28 ENCOUNTER — Telehealth: Payer: Self-pay | Admitting: Cardiology

## 2016-02-28 NOTE — Telephone Encounter (Signed)
Spoke w/ pt and requested that she send a manual transmission b/c her home monitor has not updated in at least 14 days. Pt verbalized understanding and said she would have her daughter call back later today.

## 2016-02-28 NOTE — Telephone Encounter (Signed)
Spoke w/ pt daughter and instructed her how to send a manual transmission using pt home monitor.

## 2016-03-02 DIAGNOSIS — Z7952 Long term (current) use of systemic steroids: Secondary | ICD-10-CM | POA: Diagnosis not present

## 2016-03-02 DIAGNOSIS — R413 Other amnesia: Secondary | ICD-10-CM | POA: Diagnosis not present

## 2016-03-02 DIAGNOSIS — Z8673 Personal history of transient ischemic attack (TIA), and cerebral infarction without residual deficits: Secondary | ICD-10-CM | POA: Diagnosis not present

## 2016-03-02 DIAGNOSIS — Z7902 Long term (current) use of antithrombotics/antiplatelets: Secondary | ICD-10-CM | POA: Diagnosis not present

## 2016-03-02 DIAGNOSIS — Z79891 Long term (current) use of opiate analgesic: Secondary | ICD-10-CM | POA: Diagnosis not present

## 2016-03-02 DIAGNOSIS — M13852 Other specified arthritis, left hip: Secondary | ICD-10-CM | POA: Diagnosis not present

## 2016-03-02 DIAGNOSIS — M6281 Muscle weakness (generalized): Secondary | ICD-10-CM | POA: Diagnosis not present

## 2016-03-02 DIAGNOSIS — Z9181 History of falling: Secondary | ICD-10-CM | POA: Diagnosis not present

## 2016-03-02 DIAGNOSIS — E876 Hypokalemia: Secondary | ICD-10-CM | POA: Diagnosis not present

## 2016-03-05 DIAGNOSIS — M6281 Muscle weakness (generalized): Secondary | ICD-10-CM | POA: Diagnosis not present

## 2016-03-05 DIAGNOSIS — R413 Other amnesia: Secondary | ICD-10-CM | POA: Diagnosis not present

## 2016-03-05 DIAGNOSIS — Z8673 Personal history of transient ischemic attack (TIA), and cerebral infarction without residual deficits: Secondary | ICD-10-CM | POA: Diagnosis not present

## 2016-03-05 DIAGNOSIS — Z7902 Long term (current) use of antithrombotics/antiplatelets: Secondary | ICD-10-CM | POA: Diagnosis not present

## 2016-03-05 DIAGNOSIS — Z79891 Long term (current) use of opiate analgesic: Secondary | ICD-10-CM | POA: Diagnosis not present

## 2016-03-05 DIAGNOSIS — Z9181 History of falling: Secondary | ICD-10-CM | POA: Diagnosis not present

## 2016-03-05 DIAGNOSIS — E876 Hypokalemia: Secondary | ICD-10-CM | POA: Diagnosis not present

## 2016-03-05 DIAGNOSIS — Z7952 Long term (current) use of systemic steroids: Secondary | ICD-10-CM | POA: Diagnosis not present

## 2016-03-05 DIAGNOSIS — M13852 Other specified arthritis, left hip: Secondary | ICD-10-CM | POA: Diagnosis not present

## 2016-03-10 DIAGNOSIS — Z8673 Personal history of transient ischemic attack (TIA), and cerebral infarction without residual deficits: Secondary | ICD-10-CM | POA: Diagnosis not present

## 2016-03-10 DIAGNOSIS — M6281 Muscle weakness (generalized): Secondary | ICD-10-CM | POA: Diagnosis not present

## 2016-03-10 DIAGNOSIS — Z7902 Long term (current) use of antithrombotics/antiplatelets: Secondary | ICD-10-CM | POA: Diagnosis not present

## 2016-03-10 DIAGNOSIS — M13852 Other specified arthritis, left hip: Secondary | ICD-10-CM | POA: Diagnosis not present

## 2016-03-10 DIAGNOSIS — Z9181 History of falling: Secondary | ICD-10-CM | POA: Diagnosis not present

## 2016-03-10 DIAGNOSIS — Z7952 Long term (current) use of systemic steroids: Secondary | ICD-10-CM | POA: Diagnosis not present

## 2016-03-10 DIAGNOSIS — Z79891 Long term (current) use of opiate analgesic: Secondary | ICD-10-CM | POA: Diagnosis not present

## 2016-03-10 DIAGNOSIS — R413 Other amnesia: Secondary | ICD-10-CM | POA: Diagnosis not present

## 2016-03-10 DIAGNOSIS — E876 Hypokalemia: Secondary | ICD-10-CM | POA: Diagnosis not present

## 2016-03-12 ENCOUNTER — Other Ambulatory Visit: Payer: Self-pay | Admitting: Neurology

## 2016-03-12 ENCOUNTER — Encounter (HOSPITAL_COMMUNITY): Payer: Self-pay | Admitting: Emergency Medicine

## 2016-03-12 ENCOUNTER — Emergency Department (HOSPITAL_COMMUNITY)
Admission: EM | Admit: 2016-03-12 | Discharge: 2016-03-12 | Disposition: A | Discharge status: Home / Self Care | Payer: Medicare Other | Attending: Emergency Medicine | Admitting: Emergency Medicine

## 2016-03-12 ENCOUNTER — Emergency Department (HOSPITAL_COMMUNITY): Payer: Medicare Other

## 2016-03-12 DIAGNOSIS — Y9301 Activity, walking, marching and hiking: Secondary | ICD-10-CM | POA: Diagnosis not present

## 2016-03-12 DIAGNOSIS — Y9289 Other specified places as the place of occurrence of the external cause: Secondary | ICD-10-CM | POA: Insufficient documentation

## 2016-03-12 DIAGNOSIS — R0781 Pleurodynia: Secondary | ICD-10-CM | POA: Diagnosis not present

## 2016-03-12 DIAGNOSIS — W1839XA Other fall on same level, initial encounter: Secondary | ICD-10-CM | POA: Insufficient documentation

## 2016-03-12 DIAGNOSIS — T1490 Injury, unspecified: Secondary | ICD-10-CM | POA: Diagnosis not present

## 2016-03-12 DIAGNOSIS — Z8639 Personal history of other endocrine, nutritional and metabolic disease: Secondary | ICD-10-CM | POA: Diagnosis not present

## 2016-03-12 DIAGNOSIS — S29001A Unspecified injury of muscle and tendon of front wall of thorax, initial encounter: Secondary | ICD-10-CM | POA: Diagnosis present

## 2016-03-12 DIAGNOSIS — F329 Major depressive disorder, single episode, unspecified: Secondary | ICD-10-CM | POA: Diagnosis not present

## 2016-03-12 DIAGNOSIS — S2242XA Multiple fractures of ribs, left side, initial encounter for closed fracture: Secondary | ICD-10-CM

## 2016-03-12 DIAGNOSIS — M1990 Unspecified osteoarthritis, unspecified site: Secondary | ICD-10-CM | POA: Insufficient documentation

## 2016-03-12 DIAGNOSIS — I10 Essential (primary) hypertension: Secondary | ICD-10-CM | POA: Diagnosis not present

## 2016-03-12 DIAGNOSIS — Z87891 Personal history of nicotine dependence: Secondary | ICD-10-CM | POA: Diagnosis not present

## 2016-03-12 DIAGNOSIS — M791 Myalgia: Secondary | ICD-10-CM | POA: Diagnosis not present

## 2016-03-12 DIAGNOSIS — Y998 Other external cause status: Secondary | ICD-10-CM | POA: Diagnosis not present

## 2016-03-12 DIAGNOSIS — M25552 Pain in left hip: Secondary | ICD-10-CM | POA: Diagnosis not present

## 2016-03-12 DIAGNOSIS — Z79899 Other long term (current) drug therapy: Secondary | ICD-10-CM | POA: Diagnosis not present

## 2016-03-12 DIAGNOSIS — J449 Chronic obstructive pulmonary disease, unspecified: Secondary | ICD-10-CM | POA: Diagnosis not present

## 2016-03-12 DIAGNOSIS — H269 Unspecified cataract: Secondary | ICD-10-CM | POA: Diagnosis not present

## 2016-03-12 LAB — CBC WITH DIFFERENTIAL/PLATELET
BASOS ABS: 0 10*3/uL (ref 0.0–0.1)
BASOS PCT: 0 %
Eosinophils Absolute: 0.2 10*3/uL (ref 0.0–0.7)
Eosinophils Relative: 3 %
HEMATOCRIT: 35.9 % — AB (ref 36.0–46.0)
Hemoglobin: 11.6 g/dL — ABNORMAL LOW (ref 12.0–15.0)
Lymphocytes Relative: 13 %
Lymphs Abs: 0.9 10*3/uL (ref 0.7–4.0)
MCH: 31.1 pg (ref 26.0–34.0)
MCHC: 32.3 g/dL (ref 30.0–36.0)
MCV: 96.2 fL (ref 78.0–100.0)
MONO ABS: 0.7 10*3/uL (ref 0.1–1.0)
Monocytes Relative: 10 %
NEUTROS ABS: 5 10*3/uL (ref 1.7–7.7)
NEUTROS PCT: 74 %
Platelets: 217 10*3/uL (ref 150–400)
RBC: 3.73 MIL/uL — AB (ref 3.87–5.11)
RDW: 13 % (ref 11.5–15.5)
WBC: 6.7 10*3/uL (ref 4.0–10.5)

## 2016-03-12 LAB — BASIC METABOLIC PANEL
ANION GAP: 11 (ref 5–15)
BUN: 19 mg/dL (ref 6–20)
CALCIUM: 11.2 mg/dL — AB (ref 8.9–10.3)
CO2: 26 mmol/L (ref 22–32)
Chloride: 102 mmol/L (ref 101–111)
Creatinine, Ser: 1.34 mg/dL — ABNORMAL HIGH (ref 0.44–1.00)
GFR, EST AFRICAN AMERICAN: 41 mL/min — AB (ref >60)
GFR, EST NON AFRICAN AMERICAN: 35 mL/min — AB (ref >60)
Glucose, Bld: 99 mg/dL (ref 65–99)
POTASSIUM: 3.9 mmol/L (ref 3.5–5.1)
Sodium: 139 mmol/L (ref 135–145)

## 2016-03-12 MED ORDER — OXYCODONE-ACETAMINOPHEN 5-325 MG PO TABS
1.0000 | ORAL_TABLET | Freq: Once | ORAL | Status: AC
  Administered 2016-03-12: 1 via ORAL
  Filled 2016-03-12: qty 1

## 2016-03-12 MED ORDER — OXYCODONE-ACETAMINOPHEN 5-325 MG PO TABS: 1.0000 | ORAL_TABLET | ORAL | Status: DC | PRN

## 2016-03-12 NOTE — ED Notes (Signed)
Pt able to ambulate to end of the bed with walker. Sp02 100% while ambulating. Rancour aware.

## 2016-03-12 NOTE — ED Provider Notes (Signed)
CSN: [id number]     Arrival date & time 03/12/16  4098 History   First MD Initiated Contact with Patient 03/12/16 [phone]     Chief Complaint  Patient presents with  . Fall     (Consider location/radiation/quality/duration/timing/severity/associated sxs/prior Treatment) HPI Comments: Patient presents with left-sided rib pain after mechanical fall that occurred 3 days ago. States she was walking with her walker as she normally does and stumbled and landed across her walker on her left side. Denies hitting her head or losing consciousness. She endorses left-sided rib pain worse with breathing and movement. She's been taking hydrocodone with minimal relief. Denies any fever, abdominal pain, nausea or vomiting. No focal weakness, numbness or tingling. Denies any dizziness or lightheadedness. He has chronic left hip pain at baseline which is unchanged. Denies any head, neck, back or abdominal pain. She is on Plavix but not Coumadin.  The history is provided by the patient, a relative and the EMS personnel.    Past Medical History  Diagnosis Date  . DEPRESSION 07/08/2007  . CATARACTS, BILATERAL 04/18/2008  . HYPERTENSION 07/08/2007  . COPD 04/18/2008  . GERD 07/08/2007  . ARTHRITIS, LEFT HIP 04/18/2008  . 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 Colostomy      partial  . Ep implantable device N/A 06/18/2015    Procedure: Loop Recorder Insertion;  Surgeon: Marinus Maw, MD;  Location: [facility] INVASIVE CV LAB;  Service: Cardiovascular;  Laterality: N/A;  . Tee without cardioversion N/A 06/18/2015    Procedure: TRANSESOPHAGEAL ECHOCARDIOGRAM (TEE);  Surgeon: Chrystie Nose, MD;  Location: [facility] ENDOSCOPY;  Service: Cardiovascular;  Laterality: N/A;   Family History  Problem Relation Age of Onset  . Diabetes Other   . Hypertension Other   . Cancer Other     prostate  . Heart disease Other    Social History  Substance Use Topics  .  Smoking status: Former Games developer  . Smokeless tobacco: Never Used     Comment: quit 15 yrs ago.    . Alcohol Use: No   OB History    No data available     Review of Systems  Constitutional: Negative for fever, activity change and appetite change.  HENT: Negative for congestion and rhinorrhea.   Eyes: Negative for visual disturbance.  Respiratory: Negative for cough, chest tightness and shortness of breath.   Cardiovascular: Negative for chest pain.  Gastrointestinal: Negative for nausea, vomiting and abdominal pain.  Genitourinary: Negative for dysuria, hematuria, vaginal bleeding and vaginal discharge.  Musculoskeletal: Positive for myalgias and arthralgias. Negative for back pain and neck pain.  Skin: Negative for rash.  Neurological: Negative for dizziness and headaches.  A complete 10 system review of systems was obtained and all systems are negative except as noted in the HPI and PMH.      Allergies  Codeine and Simvastatin  Home Medications   Prior to Admission medications   Medication Sig Start Date End Date Taking? Authorizing Provider  acetaminophen (TYLENOL) 325 MG tablet Take 2 tablets (650 mg total) by mouth every 6 (six) hours as needed for mild pain, moderate pain, fever or headache (or Fever >/= 101). 09/23/15   Elease Etienne, MD  atorvastatin (LIPITOR) 20 MG tablet Take 1 tablet (20 mg total) by mouth daily. 07/19/15   Micki Riley, MD  buPROPion (WELLBUTRIN SR) 100 MG 12 hr tablet TAKE 1 TABLET (100 MG TOTAL) BY MOUTH daily 01/22/16  Historical Provider, MD  citalopram (CELEXA) 20 MG tablet Take 20 mg by mouth. May increase at later date. 10/12/15   Historical Provider, MD  clopidogrel (PLAVIX) 75 MG tablet Take 1 tablet (75 mg total) by mouth daily. 07/19/15   Micki RileyPramod S Sethi, MD  donepezil (ARICEPT) 5 MG tablet Take 1 tablet (5 mg total) by mouth at bedtime. 02/18/16   Butch PennyMegan Millikan, NP  ENSURE PLUS (ENSURE PLUS) LIQD Take 237 mLs by mouth daily. 06/24/15    Historical Provider, MD  oxyCODONE-acetaminophen (PERCOCET/ROXICET) 5-325 MG tablet Take 1 tablet by mouth every 4 (four) hours as needed for severe pain. 03/12/16   Glynn OctaveStephen Avory Rahimi, MD   BP 132/93 mmHg  Pulse 83  Temp(Src) 98.9 F (37.2 C) (Oral)  Resp 16  SpO2 98% Physical Exam  Constitutional: She is oriented to person, place, and time. She appears well-developed and well-nourished. No distress.  HENT:  Head: Normocephalic and atraumatic.  Mouth/Throat: Oropharynx is clear and moist. No oropharyngeal exudate.  Eyes: Conjunctivae and EOM are normal. Pupils are equal, round, and reactive to light.  Neck: Normal range of motion. Neck supple.  No C spine tenderness  Cardiovascular: Normal rate, regular rhythm, normal heart sounds and intact distal pulses.   No murmur heard. Pulmonary/Chest: Effort normal and breath sounds normal. No respiratory distress. She exhibits tenderness.  TTP L lateral ribs, no crepitance, no ecchymosis  Abdominal: Soft. There is no tenderness. There is no rebound and no guarding.  Musculoskeletal: Normal range of motion. She exhibits no edema or tenderness.  No T or L spine tenderness  Pain with ROM hips, at baseline  Neurological: She is alert and oriented to person, place, and time. No cranial nerve deficit. She exhibits normal muscle tone. Coordination normal.  No ataxia on finger to nose bilaterally. No pronator drift. 5/5 strength throughout. CN 2-12 intact.Equal grip strength. Sensation intact.   Skin: Skin is warm.  Psychiatric: She has a normal mood and affect. Her behavior is normal.  Nursing note and vitals reviewed.   ED Course  Procedures (including critical care time) Labs Review Labs Reviewed  CBC WITH DIFFERENTIAL/PLATELET - Abnormal; Notable for the following:    RBC 3.73 (*)    Hemoglobin 11.6 (*)    HCT 35.9 (*)    All other components within normal limits  BASIC METABOLIC PANEL - Abnormal; Notable for the following:    Creatinine,  Ser 1.34 (*)    Calcium 11.2 (*)    GFR calc non Af Amer 35 (*)    GFR calc Af Amer 41 (*)    All other components within normal limits    Imaging Review Dg Ribs Unilateral W/chest Left  03/12/2016  CLINICAL DATA:  Acute left rib pain after fall yesterday. EXAM: LEFT RIBS AND CHEST - 3+ VIEW COMPARISON:  September 22, 2015. FINDINGS: Stable cardiomediastinal silhouette. No pneumothorax or pleural effusion is noted. No acute pulmonary disease is noted. Mildly displaced fractures are seen involving the lateral portions of the left third, fourth, fifth and sixth ribs. IMPRESSION: Mildly displaced left third, fourth, fifth and sixth rib fractures. No acute cardiopulmonary abnormality is noted. Electronically Signed   By: Lupita RaiderJames  Green Jr, M.D.   On: 03/12/2016 07:35   Dg Pelvis 1-2 Views  03/12/2016  CLINICAL DATA:  Larey SeatFell down on walker yesterday. Left hip tenderness. Initial encounter. EXAM: PELVIS - 1-2 VIEW COMPARISON:  Bilateral hip radiographs 12/27/2015 FINDINGS: Mild-to-moderate right hip joint space narrowing and osteophytosis do not appear significantly  changed. Severe left hip arthropathy is again seen with complete joint space loss, subchondral cystic and sclerotic change, and acetabular protrusio. No dislocation is identified on this single AP image. No definite acute fracture is identified. Deformity of the right pubic bone is unchanged and may relate to remote trauma. Multiple calcified phleboliths are noted in the pelvis. IMPRESSION: Advanced left hip arthropathy. No acute osseous abnormality identified. Electronically Signed   By: Sebastian Ache M.D.   On: 03/12/2016 07:42   I have personally reviewed and evaluated these images and lab results as part of my medical decision-making.   EKG Interpretation None      MDM   Final diagnoses:  Multiple rib fractures, left, closed, initial encounter   L rib pain after mechanical fall 4 days ago.  Did not hit head or LOC.  X-ray shows  multiple left-sided rib fractures, no pneumothorax, no pneumonia.  Discussed with Dr. Janee Morn. He agrees that patient well-appearing, not hypoxic, no pneumonia, no pneumothorax, 4 days status post injury she is likely stable for discharge with pain control.  Patient is able to ambulate without hypoxia with her walker. Her pain is controlled. Discussed admission versus discharge with the patient and her daughter. Her daughter lives with her. They're comfortable with her going home. We'll give oxycodone for pain. Advised to take this for hydrocodone every 4 hours as needed but not both. Incentive spirometer given and shown how to use. No evidence of pneumonia or pneumothorax. No fever.  Return precautions discussed.   EMERGENCY DEPARTMENT [place] FAST EXAM  INDICATIONS:Blunt trauma to the Thorax  PERFORMED BY: Myself  IMAGES ARCHIVED?: Yes  FINDINGS: All views negative  LIMITATIONS:  Emergent procedure  INTERPRETATION:  No abdominal free fluid and No pericardial effusion  COMMENT:      Glynn Octave, MD 03/12/16 1641

## 2016-03-12 NOTE — ED Notes (Signed)
Pt in EMS from home after mechanical fall, tripped while using walker. C/O L sided rib pain. A/O, denies LOC or hitting head.

## 2016-03-12 NOTE — Discharge Instructions (Signed)
Rib Fracture Take oxycodone for your rib pain or hydrocodone but not both. Follow up with your doctor this week. Return to the ED if your develop worsening pain or shortness of breath or any other concerns. A rib fracture is a break or crack in one of the bones of the ribs. The ribs are a group of long, curved bones that wrap around your chest and attach to your spine. They protect your lungs and other organs in the chest cavity. A broken or cracked rib is often painful, but most do not cause other problems. Most rib fractures heal on their own over time. However, rib fractures can be more serious if multiple ribs are broken or if broken ribs move out of place and push against other structures. CAUSES   A direct blow to the chest. For example, this could happen during contact sports, a car accident, or a fall against a hard object.  Repetitive movements with high force, such as pitching a baseball or having severe coughing spells. SYMPTOMS   Pain when you breathe in or cough.  Pain when someone presses on the injured area. DIAGNOSIS  Your caregiver will perform a physical exam. Various imaging tests may be ordered to confirm the diagnosis and to look for related injuries. These tests may include a chest X-ray, computed tomography (CT), magnetic resonance imaging (MRI), or a bone scan. TREATMENT  Rib fractures usually heal on their own in 1-3 months. The longer healing period is often associated with a continued cough or other aggravating activities. During the healing period, pain control is very important. Medication is usually given to control pain. Hospitalization or surgery may be needed for more severe injuries, such as those in which multiple ribs are broken or the ribs have moved out of place.  HOME CARE INSTRUCTIONS   Avoid strenuous activity and any activities or movements that cause pain. Be careful during activities and avoid bumping the injured rib.  Gradually increase activity as  directed by your caregiver.  Only take over-the-counter or prescription medications as directed by your caregiver. Do not take other medications without asking your caregiver first.  Apply ice to the injured area for the first 1-2 days after you have been treated or as directed by your caregiver. Applying ice helps to reduce inflammation and pain.  Put ice in a plastic bag.  Place a towel between your skin and the bag.   Leave the ice on for 15-20 minutes at a time, every 2 hours while you are awake.  Perform deep breathing as directed by your caregiver. This will help prevent pneumonia, which is a common complication of a broken rib. Your caregiver may instruct you to:  Take deep breaths several times a day.  Try to cough several times a day, holding a pillow against the injured area.  Use a device called an incentive spirometer to practice deep breathing several times a day.  Drink enough fluids to keep your urine clear or pale yellow. This will help you avoid constipation.   Do not wear a rib belt or binder. These restrict breathing, which can lead to pneumonia.  SEEK IMMEDIATE MEDICAL CARE IF:   You have a fever.   You have difficulty breathing or shortness of breath.   You develop a continual cough, or you cough up thick or bloody sputum.  You feel sick to your stomach (nausea), throw up (vomit), or have abdominal pain.   You have worsening pain not controlled with medications.  MAKE SURE YOU:  Understand these instructions.  Will watch your condition.  Will get help right away if you are not doing well or get worse.   This information is not intended to replace advice given to you by your health care provider. Make sure you discuss any questions you have with your health care provider.   Document Released: 11/16/2005 Document Revised: 07/19/2013 Document Reviewed: 01/18/2013 Elsevier Interactive Patient Education [employer].

## 2016-03-13 DIAGNOSIS — M6281 Muscle weakness (generalized): Secondary | ICD-10-CM | POA: Diagnosis not present

## 2016-03-13 DIAGNOSIS — R413 Other amnesia: Secondary | ICD-10-CM | POA: Diagnosis not present

## 2016-03-13 DIAGNOSIS — E876 Hypokalemia: Secondary | ICD-10-CM | POA: Diagnosis not present

## 2016-03-13 DIAGNOSIS — Z7902 Long term (current) use of antithrombotics/antiplatelets: Secondary | ICD-10-CM | POA: Diagnosis not present

## 2016-03-13 DIAGNOSIS — Z7952 Long term (current) use of systemic steroids: Secondary | ICD-10-CM | POA: Diagnosis not present

## 2016-03-13 DIAGNOSIS — Z79891 Long term (current) use of opiate analgesic: Secondary | ICD-10-CM | POA: Diagnosis not present

## 2016-03-13 DIAGNOSIS — Z9181 History of falling: Secondary | ICD-10-CM | POA: Diagnosis not present

## 2016-03-13 DIAGNOSIS — Z8673 Personal history of transient ischemic attack (TIA), and cerebral infarction without residual deficits: Secondary | ICD-10-CM | POA: Diagnosis not present

## 2016-03-13 DIAGNOSIS — M13852 Other specified arthritis, left hip: Secondary | ICD-10-CM | POA: Diagnosis not present

## 2016-03-17 ENCOUNTER — Ambulatory Visit (INDEPENDENT_AMBULATORY_CARE_PROVIDER_SITE_OTHER): Payer: Medicare Other | Admitting: *Deleted

## 2016-03-17 DIAGNOSIS — E876 Hypokalemia: Secondary | ICD-10-CM | POA: Diagnosis not present

## 2016-03-17 DIAGNOSIS — Z7902 Long term (current) use of antithrombotics/antiplatelets: Secondary | ICD-10-CM | POA: Diagnosis not present

## 2016-03-17 DIAGNOSIS — Z79891 Long term (current) use of opiate analgesic: Secondary | ICD-10-CM | POA: Diagnosis not present

## 2016-03-17 DIAGNOSIS — M13852 Other specified arthritis, left hip: Secondary | ICD-10-CM | POA: Diagnosis not present

## 2016-03-17 DIAGNOSIS — I638 Other cerebral infarction: Secondary | ICD-10-CM

## 2016-03-17 DIAGNOSIS — R413 Other amnesia: Secondary | ICD-10-CM | POA: Diagnosis not present

## 2016-03-17 DIAGNOSIS — M6281 Muscle weakness (generalized): Secondary | ICD-10-CM | POA: Diagnosis not present

## 2016-03-17 DIAGNOSIS — Z8673 Personal history of transient ischemic attack (TIA), and cerebral infarction without residual deficits: Secondary | ICD-10-CM | POA: Diagnosis not present

## 2016-03-17 DIAGNOSIS — Z9181 History of falling: Secondary | ICD-10-CM | POA: Diagnosis not present

## 2016-03-17 DIAGNOSIS — Z7952 Long term (current) use of systemic steroids: Secondary | ICD-10-CM | POA: Diagnosis not present

## 2016-03-17 DIAGNOSIS — I6389 Other cerebral infarction: Secondary | ICD-10-CM

## 2016-03-18 NOTE — Progress Notes (Signed)
Carelink Summary Report / Loop Recorder 

## 2016-03-19 DIAGNOSIS — R413 Other amnesia: Secondary | ICD-10-CM | POA: Diagnosis not present

## 2016-03-19 DIAGNOSIS — Z7902 Long term (current) use of antithrombotics/antiplatelets: Secondary | ICD-10-CM | POA: Diagnosis not present

## 2016-03-19 DIAGNOSIS — Z7952 Long term (current) use of systemic steroids: Secondary | ICD-10-CM | POA: Diagnosis not present

## 2016-03-19 DIAGNOSIS — Z79891 Long term (current) use of opiate analgesic: Secondary | ICD-10-CM | POA: Diagnosis not present

## 2016-03-19 DIAGNOSIS — M6281 Muscle weakness (generalized): Secondary | ICD-10-CM | POA: Diagnosis not present

## 2016-03-19 DIAGNOSIS — M13852 Other specified arthritis, left hip: Secondary | ICD-10-CM | POA: Diagnosis not present

## 2016-03-19 DIAGNOSIS — Z9181 History of falling: Secondary | ICD-10-CM | POA: Diagnosis not present

## 2016-03-19 DIAGNOSIS — E876 Hypokalemia: Secondary | ICD-10-CM | POA: Diagnosis not present

## 2016-03-19 DIAGNOSIS — Z8673 Personal history of transient ischemic attack (TIA), and cerebral infarction without residual deficits: Secondary | ICD-10-CM | POA: Diagnosis not present

## 2016-03-20 ENCOUNTER — Telehealth: Payer: Self-pay | Admitting: Cardiology

## 2016-03-20 NOTE — Telephone Encounter (Signed)
LMOVM requesting that pt send manual transmission b/c home monitor has not updated in at least 14 days.    

## 2016-03-24 DIAGNOSIS — R413 Other amnesia: Secondary | ICD-10-CM | POA: Diagnosis not present

## 2016-03-24 DIAGNOSIS — E876 Hypokalemia: Secondary | ICD-10-CM | POA: Diagnosis not present

## 2016-03-24 DIAGNOSIS — Z9181 History of falling: Secondary | ICD-10-CM | POA: Diagnosis not present

## 2016-03-24 DIAGNOSIS — Z7902 Long term (current) use of antithrombotics/antiplatelets: Secondary | ICD-10-CM | POA: Diagnosis not present

## 2016-03-24 DIAGNOSIS — Z79891 Long term (current) use of opiate analgesic: Secondary | ICD-10-CM | POA: Diagnosis not present

## 2016-03-24 DIAGNOSIS — M6281 Muscle weakness (generalized): Secondary | ICD-10-CM | POA: Diagnosis not present

## 2016-03-24 DIAGNOSIS — M13852 Other specified arthritis, left hip: Secondary | ICD-10-CM | POA: Diagnosis not present

## 2016-03-24 DIAGNOSIS — Z7952 Long term (current) use of systemic steroids: Secondary | ICD-10-CM | POA: Diagnosis not present

## 2016-03-24 DIAGNOSIS — Z8673 Personal history of transient ischemic attack (TIA), and cerebral infarction without residual deficits: Secondary | ICD-10-CM | POA: Diagnosis not present

## 2016-03-26 DIAGNOSIS — Z7952 Long term (current) use of systemic steroids: Secondary | ICD-10-CM | POA: Diagnosis not present

## 2016-03-26 DIAGNOSIS — Z8673 Personal history of transient ischemic attack (TIA), and cerebral infarction without residual deficits: Secondary | ICD-10-CM | POA: Diagnosis not present

## 2016-03-26 DIAGNOSIS — M6281 Muscle weakness (generalized): Secondary | ICD-10-CM | POA: Diagnosis not present

## 2016-03-26 DIAGNOSIS — M13852 Other specified arthritis, left hip: Secondary | ICD-10-CM | POA: Diagnosis not present

## 2016-03-26 DIAGNOSIS — E876 Hypokalemia: Secondary | ICD-10-CM | POA: Diagnosis not present

## 2016-03-26 DIAGNOSIS — Z9181 History of falling: Secondary | ICD-10-CM | POA: Diagnosis not present

## 2016-03-26 DIAGNOSIS — Z79891 Long term (current) use of opiate analgesic: Secondary | ICD-10-CM | POA: Diagnosis not present

## 2016-03-26 DIAGNOSIS — R413 Other amnesia: Secondary | ICD-10-CM | POA: Diagnosis not present

## 2016-03-26 DIAGNOSIS — Z7902 Long term (current) use of antithrombotics/antiplatelets: Secondary | ICD-10-CM | POA: Diagnosis not present

## 2016-03-27 ENCOUNTER — Encounter: Payer: Self-pay | Admitting: Cardiology

## 2016-03-31 DIAGNOSIS — Z9181 History of falling: Secondary | ICD-10-CM | POA: Diagnosis not present

## 2016-03-31 DIAGNOSIS — E876 Hypokalemia: Secondary | ICD-10-CM | POA: Diagnosis not present

## 2016-03-31 DIAGNOSIS — Z79891 Long term (current) use of opiate analgesic: Secondary | ICD-10-CM | POA: Diagnosis not present

## 2016-03-31 DIAGNOSIS — M6281 Muscle weakness (generalized): Secondary | ICD-10-CM | POA: Diagnosis not present

## 2016-03-31 DIAGNOSIS — Z7902 Long term (current) use of antithrombotics/antiplatelets: Secondary | ICD-10-CM | POA: Diagnosis not present

## 2016-03-31 DIAGNOSIS — M13852 Other specified arthritis, left hip: Secondary | ICD-10-CM | POA: Diagnosis not present

## 2016-03-31 DIAGNOSIS — R413 Other amnesia: Secondary | ICD-10-CM | POA: Diagnosis not present

## 2016-03-31 DIAGNOSIS — Z8673 Personal history of transient ischemic attack (TIA), and cerebral infarction without residual deficits: Secondary | ICD-10-CM | POA: Diagnosis not present

## 2016-03-31 DIAGNOSIS — Z7952 Long term (current) use of systemic steroids: Secondary | ICD-10-CM | POA: Diagnosis not present

## 2016-04-02 DIAGNOSIS — Z7902 Long term (current) use of antithrombotics/antiplatelets: Secondary | ICD-10-CM | POA: Diagnosis not present

## 2016-04-02 DIAGNOSIS — M13852 Other specified arthritis, left hip: Secondary | ICD-10-CM | POA: Diagnosis not present

## 2016-04-02 DIAGNOSIS — Z9181 History of falling: Secondary | ICD-10-CM | POA: Diagnosis not present

## 2016-04-02 DIAGNOSIS — Z79891 Long term (current) use of opiate analgesic: Secondary | ICD-10-CM | POA: Diagnosis not present

## 2016-04-02 DIAGNOSIS — E876 Hypokalemia: Secondary | ICD-10-CM | POA: Diagnosis not present

## 2016-04-02 DIAGNOSIS — H902 Conductive hearing loss, unspecified: Secondary | ICD-10-CM | POA: Diagnosis not present

## 2016-04-02 DIAGNOSIS — R413 Other amnesia: Secondary | ICD-10-CM | POA: Diagnosis not present

## 2016-04-02 DIAGNOSIS — I10 Essential (primary) hypertension: Secondary | ICD-10-CM | POA: Diagnosis not present

## 2016-04-02 DIAGNOSIS — Z8673 Personal history of transient ischemic attack (TIA), and cerebral infarction without residual deficits: Secondary | ICD-10-CM | POA: Diagnosis not present

## 2016-04-02 DIAGNOSIS — Z7952 Long term (current) use of systemic steroids: Secondary | ICD-10-CM | POA: Diagnosis not present

## 2016-04-02 DIAGNOSIS — I639 Cerebral infarction, unspecified: Secondary | ICD-10-CM | POA: Diagnosis not present

## 2016-04-02 DIAGNOSIS — E785 Hyperlipidemia, unspecified: Secondary | ICD-10-CM | POA: Diagnosis not present

## 2016-04-02 DIAGNOSIS — M6281 Muscle weakness (generalized): Secondary | ICD-10-CM | POA: Diagnosis not present

## 2016-04-10 ENCOUNTER — Encounter: Payer: Self-pay | Admitting: Cardiology

## 2016-04-16 ENCOUNTER — Encounter: Payer: Medicare Other | Admitting: *Deleted

## 2016-04-24 LAB — CUP PACEART REMOTE DEVICE CHECK: MDC IDC SESS DTM: 20170319220554

## 2016-04-27 LAB — CUP PACEART REMOTE DEVICE CHECK: MDC IDC SESS DTM: 20170418221121

## 2016-04-27 NOTE — Progress Notes (Signed)
Patient ID: Adriana Middleton, female   DOB: [birth year], 82 y.o.   MRN: [id number] [facility] summary report received. Battery status OK. Normal device function. No new symptom episodes, tachy episodes, brady, or pause episodes. No new AF episodes. Monthly summary reports and ROV/PRN

## 2016-04-29 DIAGNOSIS — Z7902 Long term (current) use of antithrombotics/antiplatelets: Secondary | ICD-10-CM | POA: Diagnosis not present

## 2016-04-29 DIAGNOSIS — E876 Hypokalemia: Secondary | ICD-10-CM | POA: Diagnosis not present

## 2016-04-29 DIAGNOSIS — Z79891 Long term (current) use of opiate analgesic: Secondary | ICD-10-CM | POA: Diagnosis not present

## 2016-04-29 DIAGNOSIS — Z7952 Long term (current) use of systemic steroids: Secondary | ICD-10-CM | POA: Diagnosis not present

## 2016-04-29 DIAGNOSIS — M13852 Other specified arthritis, left hip: Secondary | ICD-10-CM | POA: Diagnosis not present

## 2016-04-29 DIAGNOSIS — Z9181 History of falling: Secondary | ICD-10-CM | POA: Diagnosis not present

## 2016-04-29 DIAGNOSIS — R413 Other amnesia: Secondary | ICD-10-CM | POA: Diagnosis not present

## 2016-04-29 DIAGNOSIS — M6281 Muscle weakness (generalized): Secondary | ICD-10-CM | POA: Diagnosis not present

## 2016-04-29 DIAGNOSIS — Z8673 Personal history of transient ischemic attack (TIA), and cerebral infarction without residual deficits: Secondary | ICD-10-CM | POA: Diagnosis not present

## 2016-05-05 DIAGNOSIS — Z8673 Personal history of transient ischemic attack (TIA), and cerebral infarction without residual deficits: Secondary | ICD-10-CM | POA: Diagnosis not present

## 2016-05-05 DIAGNOSIS — R413 Other amnesia: Secondary | ICD-10-CM | POA: Diagnosis not present

## 2016-05-05 DIAGNOSIS — Z79891 Long term (current) use of opiate analgesic: Secondary | ICD-10-CM | POA: Diagnosis not present

## 2016-05-05 DIAGNOSIS — E876 Hypokalemia: Secondary | ICD-10-CM | POA: Diagnosis not present

## 2016-05-05 DIAGNOSIS — Z7952 Long term (current) use of systemic steroids: Secondary | ICD-10-CM | POA: Diagnosis not present

## 2016-05-05 DIAGNOSIS — M13852 Other specified arthritis, left hip: Secondary | ICD-10-CM | POA: Diagnosis not present

## 2016-05-05 DIAGNOSIS — Z7902 Long term (current) use of antithrombotics/antiplatelets: Secondary | ICD-10-CM | POA: Diagnosis not present

## 2016-05-05 DIAGNOSIS — M6281 Muscle weakness (generalized): Secondary | ICD-10-CM | POA: Diagnosis not present

## 2016-05-05 DIAGNOSIS — Z9181 History of falling: Secondary | ICD-10-CM | POA: Diagnosis not present

## 2016-05-07 DIAGNOSIS — Z9181 History of falling: Secondary | ICD-10-CM | POA: Diagnosis not present

## 2016-05-07 DIAGNOSIS — M13852 Other specified arthritis, left hip: Secondary | ICD-10-CM | POA: Diagnosis not present

## 2016-05-07 DIAGNOSIS — E876 Hypokalemia: Secondary | ICD-10-CM | POA: Diagnosis not present

## 2016-05-07 DIAGNOSIS — Z7902 Long term (current) use of antithrombotics/antiplatelets: Secondary | ICD-10-CM | POA: Diagnosis not present

## 2016-05-07 DIAGNOSIS — R413 Other amnesia: Secondary | ICD-10-CM | POA: Diagnosis not present

## 2016-05-07 DIAGNOSIS — Z8673 Personal history of transient ischemic attack (TIA), and cerebral infarction without residual deficits: Secondary | ICD-10-CM | POA: Diagnosis not present

## 2016-05-07 DIAGNOSIS — M6281 Muscle weakness (generalized): Secondary | ICD-10-CM | POA: Diagnosis not present

## 2016-05-07 DIAGNOSIS — Z7952 Long term (current) use of systemic steroids: Secondary | ICD-10-CM | POA: Diagnosis not present

## 2016-05-07 DIAGNOSIS — Z79891 Long term (current) use of opiate analgesic: Secondary | ICD-10-CM | POA: Diagnosis not present

## 2016-05-12 DIAGNOSIS — M6281 Muscle weakness (generalized): Secondary | ICD-10-CM | POA: Diagnosis not present

## 2016-05-12 DIAGNOSIS — R413 Other amnesia: Secondary | ICD-10-CM | POA: Diagnosis not present

## 2016-05-12 DIAGNOSIS — Z7902 Long term (current) use of antithrombotics/antiplatelets: Secondary | ICD-10-CM | POA: Diagnosis not present

## 2016-05-12 DIAGNOSIS — Z8673 Personal history of transient ischemic attack (TIA), and cerebral infarction without residual deficits: Secondary | ICD-10-CM | POA: Diagnosis not present

## 2016-05-12 DIAGNOSIS — Z7952 Long term (current) use of systemic steroids: Secondary | ICD-10-CM | POA: Diagnosis not present

## 2016-05-12 DIAGNOSIS — E876 Hypokalemia: Secondary | ICD-10-CM | POA: Diagnosis not present

## 2016-05-12 DIAGNOSIS — Z79891 Long term (current) use of opiate analgesic: Secondary | ICD-10-CM | POA: Diagnosis not present

## 2016-05-12 DIAGNOSIS — M13852 Other specified arthritis, left hip: Secondary | ICD-10-CM | POA: Diagnosis not present

## 2016-05-12 DIAGNOSIS — Z9181 History of falling: Secondary | ICD-10-CM | POA: Diagnosis not present

## 2016-05-14 DIAGNOSIS — M13852 Other specified arthritis, left hip: Secondary | ICD-10-CM | POA: Diagnosis not present

## 2016-05-14 DIAGNOSIS — M6281 Muscle weakness (generalized): Secondary | ICD-10-CM | POA: Diagnosis not present

## 2016-05-14 DIAGNOSIS — H902 Conductive hearing loss, unspecified: Secondary | ICD-10-CM | POA: Diagnosis not present

## 2016-05-14 DIAGNOSIS — Z79891 Long term (current) use of opiate analgesic: Secondary | ICD-10-CM | POA: Diagnosis not present

## 2016-05-14 DIAGNOSIS — E876 Hypokalemia: Secondary | ICD-10-CM | POA: Diagnosis not present

## 2016-05-14 DIAGNOSIS — Z9181 History of falling: Secondary | ICD-10-CM | POA: Diagnosis not present

## 2016-05-14 DIAGNOSIS — Z7902 Long term (current) use of antithrombotics/antiplatelets: Secondary | ICD-10-CM | POA: Diagnosis not present

## 2016-05-14 DIAGNOSIS — I639 Cerebral infarction, unspecified: Secondary | ICD-10-CM | POA: Diagnosis not present

## 2016-05-14 DIAGNOSIS — Z8673 Personal history of transient ischemic attack (TIA), and cerebral infarction without residual deficits: Secondary | ICD-10-CM | POA: Diagnosis not present

## 2016-05-14 DIAGNOSIS — I10 Essential (primary) hypertension: Secondary | ICD-10-CM | POA: Diagnosis not present

## 2016-05-14 DIAGNOSIS — R413 Other amnesia: Secondary | ICD-10-CM | POA: Diagnosis not present

## 2016-05-14 DIAGNOSIS — Z7952 Long term (current) use of systemic steroids: Secondary | ICD-10-CM | POA: Diagnosis not present

## 2016-05-19 DIAGNOSIS — E876 Hypokalemia: Secondary | ICD-10-CM | POA: Diagnosis not present

## 2016-05-19 DIAGNOSIS — Z7952 Long term (current) use of systemic steroids: Secondary | ICD-10-CM | POA: Diagnosis not present

## 2016-05-19 DIAGNOSIS — M6281 Muscle weakness (generalized): Secondary | ICD-10-CM | POA: Diagnosis not present

## 2016-05-19 DIAGNOSIS — M13852 Other specified arthritis, left hip: Secondary | ICD-10-CM | POA: Diagnosis not present

## 2016-05-19 DIAGNOSIS — R413 Other amnesia: Secondary | ICD-10-CM | POA: Diagnosis not present

## 2016-05-19 DIAGNOSIS — Z9181 History of falling: Secondary | ICD-10-CM | POA: Diagnosis not present

## 2016-05-19 DIAGNOSIS — Z79891 Long term (current) use of opiate analgesic: Secondary | ICD-10-CM | POA: Diagnosis not present

## 2016-05-19 DIAGNOSIS — Z7902 Long term (current) use of antithrombotics/antiplatelets: Secondary | ICD-10-CM | POA: Diagnosis not present

## 2016-05-19 DIAGNOSIS — Z8673 Personal history of transient ischemic attack (TIA), and cerebral infarction without residual deficits: Secondary | ICD-10-CM | POA: Diagnosis not present

## 2016-05-21 DIAGNOSIS — E876 Hypokalemia: Secondary | ICD-10-CM | POA: Diagnosis not present

## 2016-05-21 DIAGNOSIS — M6281 Muscle weakness (generalized): Secondary | ICD-10-CM | POA: Diagnosis not present

## 2016-05-21 DIAGNOSIS — R413 Other amnesia: Secondary | ICD-10-CM | POA: Diagnosis not present

## 2016-05-21 DIAGNOSIS — Z79891 Long term (current) use of opiate analgesic: Secondary | ICD-10-CM | POA: Diagnosis not present

## 2016-05-21 DIAGNOSIS — Z7902 Long term (current) use of antithrombotics/antiplatelets: Secondary | ICD-10-CM | POA: Diagnosis not present

## 2016-05-21 DIAGNOSIS — Z9181 History of falling: Secondary | ICD-10-CM | POA: Diagnosis not present

## 2016-05-21 DIAGNOSIS — Z8673 Personal history of transient ischemic attack (TIA), and cerebral infarction without residual deficits: Secondary | ICD-10-CM | POA: Diagnosis not present

## 2016-05-21 DIAGNOSIS — Z7952 Long term (current) use of systemic steroids: Secondary | ICD-10-CM | POA: Diagnosis not present

## 2016-05-21 DIAGNOSIS — M13852 Other specified arthritis, left hip: Secondary | ICD-10-CM | POA: Diagnosis not present

## 2016-05-26 DIAGNOSIS — Z9181 History of falling: Secondary | ICD-10-CM | POA: Diagnosis not present

## 2016-05-26 DIAGNOSIS — Z7952 Long term (current) use of systemic steroids: Secondary | ICD-10-CM | POA: Diagnosis not present

## 2016-05-26 DIAGNOSIS — E876 Hypokalemia: Secondary | ICD-10-CM | POA: Diagnosis not present

## 2016-05-26 DIAGNOSIS — R413 Other amnesia: Secondary | ICD-10-CM | POA: Diagnosis not present

## 2016-05-26 DIAGNOSIS — Z8673 Personal history of transient ischemic attack (TIA), and cerebral infarction without residual deficits: Secondary | ICD-10-CM | POA: Diagnosis not present

## 2016-05-26 DIAGNOSIS — Z7902 Long term (current) use of antithrombotics/antiplatelets: Secondary | ICD-10-CM | POA: Diagnosis not present

## 2016-05-26 DIAGNOSIS — Z79891 Long term (current) use of opiate analgesic: Secondary | ICD-10-CM | POA: Diagnosis not present

## 2016-05-26 DIAGNOSIS — M6281 Muscle weakness (generalized): Secondary | ICD-10-CM | POA: Diagnosis not present

## 2016-05-26 DIAGNOSIS — M13852 Other specified arthritis, left hip: Secondary | ICD-10-CM | POA: Diagnosis not present

## 2016-05-28 DIAGNOSIS — R413 Other amnesia: Secondary | ICD-10-CM | POA: Diagnosis not present

## 2016-05-28 DIAGNOSIS — M6281 Muscle weakness (generalized): Secondary | ICD-10-CM | POA: Diagnosis not present

## 2016-05-28 DIAGNOSIS — M13852 Other specified arthritis, left hip: Secondary | ICD-10-CM | POA: Diagnosis not present

## 2016-05-28 DIAGNOSIS — Z79891 Long term (current) use of opiate analgesic: Secondary | ICD-10-CM | POA: Diagnosis not present

## 2016-05-28 DIAGNOSIS — Z7952 Long term (current) use of systemic steroids: Secondary | ICD-10-CM | POA: Diagnosis not present

## 2016-05-28 DIAGNOSIS — E876 Hypokalemia: Secondary | ICD-10-CM | POA: Diagnosis not present

## 2016-05-28 DIAGNOSIS — Z8673 Personal history of transient ischemic attack (TIA), and cerebral infarction without residual deficits: Secondary | ICD-10-CM | POA: Diagnosis not present

## 2016-05-28 DIAGNOSIS — Z7902 Long term (current) use of antithrombotics/antiplatelets: Secondary | ICD-10-CM | POA: Diagnosis not present

## 2016-05-28 DIAGNOSIS — Z9181 History of falling: Secondary | ICD-10-CM | POA: Diagnosis not present

## 2016-06-01 DIAGNOSIS — Z9181 History of falling: Secondary | ICD-10-CM | POA: Diagnosis not present

## 2016-06-01 DIAGNOSIS — R413 Other amnesia: Secondary | ICD-10-CM | POA: Diagnosis not present

## 2016-06-01 DIAGNOSIS — M6281 Muscle weakness (generalized): Secondary | ICD-10-CM | POA: Diagnosis not present

## 2016-06-01 DIAGNOSIS — Z79891 Long term (current) use of opiate analgesic: Secondary | ICD-10-CM | POA: Diagnosis not present

## 2016-06-01 DIAGNOSIS — Z8673 Personal history of transient ischemic attack (TIA), and cerebral infarction without residual deficits: Secondary | ICD-10-CM | POA: Diagnosis not present

## 2016-06-01 DIAGNOSIS — M13852 Other specified arthritis, left hip: Secondary | ICD-10-CM | POA: Diagnosis not present

## 2016-06-01 DIAGNOSIS — Z7952 Long term (current) use of systemic steroids: Secondary | ICD-10-CM | POA: Diagnosis not present

## 2016-06-01 DIAGNOSIS — E876 Hypokalemia: Secondary | ICD-10-CM | POA: Diagnosis not present

## 2016-06-01 DIAGNOSIS — Z7902 Long term (current) use of antithrombotics/antiplatelets: Secondary | ICD-10-CM | POA: Diagnosis not present

## 2016-06-05 DIAGNOSIS — R413 Other amnesia: Secondary | ICD-10-CM | POA: Diagnosis not present

## 2016-06-05 DIAGNOSIS — M6281 Muscle weakness (generalized): Secondary | ICD-10-CM | POA: Diagnosis not present

## 2016-06-05 DIAGNOSIS — Z8673 Personal history of transient ischemic attack (TIA), and cerebral infarction without residual deficits: Secondary | ICD-10-CM | POA: Diagnosis not present

## 2016-06-05 DIAGNOSIS — Z7902 Long term (current) use of antithrombotics/antiplatelets: Secondary | ICD-10-CM | POA: Diagnosis not present

## 2016-06-05 DIAGNOSIS — E876 Hypokalemia: Secondary | ICD-10-CM | POA: Diagnosis not present

## 2016-06-05 DIAGNOSIS — Z9181 History of falling: Secondary | ICD-10-CM | POA: Diagnosis not present

## 2016-06-05 DIAGNOSIS — M13852 Other specified arthritis, left hip: Secondary | ICD-10-CM | POA: Diagnosis not present

## 2016-06-05 DIAGNOSIS — Z7952 Long term (current) use of systemic steroids: Secondary | ICD-10-CM | POA: Diagnosis not present

## 2016-06-05 DIAGNOSIS — Z79891 Long term (current) use of opiate analgesic: Secondary | ICD-10-CM | POA: Diagnosis not present

## 2016-06-09 DIAGNOSIS — R413 Other amnesia: Secondary | ICD-10-CM | POA: Diagnosis not present

## 2016-06-09 DIAGNOSIS — Z7902 Long term (current) use of antithrombotics/antiplatelets: Secondary | ICD-10-CM | POA: Diagnosis not present

## 2016-06-09 DIAGNOSIS — Z8673 Personal history of transient ischemic attack (TIA), and cerebral infarction without residual deficits: Secondary | ICD-10-CM | POA: Diagnosis not present

## 2016-06-09 DIAGNOSIS — Z79891 Long term (current) use of opiate analgesic: Secondary | ICD-10-CM | POA: Diagnosis not present

## 2016-06-09 DIAGNOSIS — E876 Hypokalemia: Secondary | ICD-10-CM | POA: Diagnosis not present

## 2016-06-09 DIAGNOSIS — Z7952 Long term (current) use of systemic steroids: Secondary | ICD-10-CM | POA: Diagnosis not present

## 2016-06-09 DIAGNOSIS — Z9181 History of falling: Secondary | ICD-10-CM | POA: Diagnosis not present

## 2016-06-09 DIAGNOSIS — M13852 Other specified arthritis, left hip: Secondary | ICD-10-CM | POA: Diagnosis not present

## 2016-06-09 DIAGNOSIS — M6281 Muscle weakness (generalized): Secondary | ICD-10-CM | POA: Diagnosis not present

## 2016-06-11 DIAGNOSIS — Z7902 Long term (current) use of antithrombotics/antiplatelets: Secondary | ICD-10-CM | POA: Diagnosis not present

## 2016-06-11 DIAGNOSIS — M13852 Other specified arthritis, left hip: Secondary | ICD-10-CM | POA: Diagnosis not present

## 2016-06-11 DIAGNOSIS — R413 Other amnesia: Secondary | ICD-10-CM | POA: Diagnosis not present

## 2016-06-11 DIAGNOSIS — E876 Hypokalemia: Secondary | ICD-10-CM | POA: Diagnosis not present

## 2016-06-11 DIAGNOSIS — Z79891 Long term (current) use of opiate analgesic: Secondary | ICD-10-CM | POA: Diagnosis not present

## 2016-06-11 DIAGNOSIS — Z8673 Personal history of transient ischemic attack (TIA), and cerebral infarction without residual deficits: Secondary | ICD-10-CM | POA: Diagnosis not present

## 2016-06-11 DIAGNOSIS — M6281 Muscle weakness (generalized): Secondary | ICD-10-CM | POA: Diagnosis not present

## 2016-06-11 DIAGNOSIS — Z7952 Long term (current) use of systemic steroids: Secondary | ICD-10-CM | POA: Diagnosis not present

## 2016-06-11 DIAGNOSIS — Z9181 History of falling: Secondary | ICD-10-CM | POA: Diagnosis not present

## 2016-07-27 DIAGNOSIS — H2512 Age-related nuclear cataract, left eye: Secondary | ICD-10-CM | POA: Diagnosis not present

## 2016-07-27 DIAGNOSIS — H539 Unspecified visual disturbance: Secondary | ICD-10-CM | POA: Diagnosis not present

## 2016-07-30 DIAGNOSIS — M810 Age-related osteoporosis without current pathological fracture: Secondary | ICD-10-CM | POA: Diagnosis not present

## 2016-08-12 ENCOUNTER — Ambulatory Visit: Payer: Medicare Other | Admitting: Neurology

## 2016-08-13 ENCOUNTER — Encounter: Payer: Self-pay | Admitting: Neurology

## 2016-08-13 ENCOUNTER — Ambulatory Visit (INDEPENDENT_AMBULATORY_CARE_PROVIDER_SITE_OTHER): Payer: Medicare Other | Admitting: Neurology

## 2016-08-13 VITALS — BP 99/55 | HR 67 | Ht 62.0 in | Wt 105.8 lb

## 2016-08-13 DIAGNOSIS — G308 Other Alzheimer's disease: Secondary | ICD-10-CM | POA: Diagnosis not present

## 2016-08-13 DIAGNOSIS — F0281 Dementia in other diseases classified elsewhere with behavioral disturbance: Secondary | ICD-10-CM | POA: Diagnosis not present

## 2016-08-13 MED ORDER — DONEPEZIL HCL 10 MG PO TABS: 10.0000 mg | ORAL_TABLET | Freq: Every day | ORAL | 3 refills | Status: DC

## 2016-08-13 NOTE — Patient Instructions (Signed)
I had a long discussion with the patient and daughter regarding her remote stroke as well as Alzheimer's dementia. I recommend she increase Aricept dose to 10 mg at night. Discontinue Celexa as she is already on Wellbutrin and on is requesting her  medications to be reduced. I also advised her to see her primary physician Dr. Parke SimmersBland to discuss reduction of blood pressure medication if possible. Continue Plavix for stroke prevention with strict control of hypertension blood pressure goal below 140/90 and lipids with LDL cholesterol goal below 70 mg percent. Continue to use walker at all times and fall and safety precautions. Return for follow-up in 6 months with nurse practitioner or call earlier if necessary.

## 2016-08-13 NOTE — Progress Notes (Signed)
PATIENT: Adriana Middleton DOB: [birth year]  REASON FOR VISIT: follow up- stroke, memory HISTORY FROM: patient  HISTORY OF PRESENT ILLNESS: Adriana Middleton is an 82 year old female with a history of stroke. She returns today for follow-up. She continues to take aspirin and Plavix. She is tolerating this medication well. Her primary care manages her blood pressure and cholesterol. At the last visit the patient was having increased trouble with her memory. Her daughter feels that her memory has actually improved. She states that she is now doing puzzles and reading the newspaper again. She is able to complete most ADLs independently. She does require assistance getting in and out of the bathtub. She does require help with preparing her meals. The patient continues to have some residual left-sided weakness from the stroke. She uses a walker when ambulating and uses a wheelchair for longer distances. The patient was hospitalized in October for dehydration. The patient is also having pain in the left groin area especially when flexing or extending at the hip. Her primary care ordered an x-ray of the hip but they have not received the results. She returns today for an evaluation.   HISTORY 10/14/15: Adriana Middleton is a 82 year old female with a history of stroke. She returns today for follow-up. She is currently taking aspirin and Plavix for stroke prevention. She reports that she is tolerating these medications well. Her primary care is managing her hypertension and hyperlipidemia. Blood pressure has been under relatively good control. The patient is on Lipitor. She states that she was also put on Celexa in addition to the Wellbutrin to help with her depression. Her daughter states that her primary care asked for our input regarding increasing the Celexa. Daughter states that she has good and bad days in regards to her mood. She states that she uses a walker in her home but a wheelchair when she is out. She denies any falls.  Denies any trouble with her swallowing with the exception that she tends to pocket food on the left side. The patient still feels weaker on the left side. She does have a Loop recorder however atrial fibrillation has not been reported. Daughter reports that her memory has been affected since the stroke. She states that she has a hard time remembering people's names. She often talks about her deceased husband as if he is alive. She currently lives at home with her daughter. She requires some assistance with ADLs. She returns today for an evaluation.  HISTORY 07/19/15 (Adriana Middleton):[name] is seen today for the first office follow-up visit following hospital admission for stroke in June 2016. Adriana Middleton is an 82 y.o. female with a past medical history significant for HTN , MVP, depression, COPD, brought in by family for further evaluation of left sided weakness, imbalance, fall, recent right brain infarct. Patient was seen in this ED on 7/11 with complains of left face weakness, slurred speech, and left sided weakness (LKW 06/09/2015, time unknown). MRI brain demonstrated multifocal areas of acute right MCA distribution infarcts as well as findings suggestive of markedly attenuated flow in the cavernous right internal carotid artery and no definite flow in the right middle cerebral artery or at the right MCA bifurcation. Patient refused to be admitted to the hospital and left against medical advice. She has been taking aspirin..Adriana Middleton returns to the ED today 06/16/2015 complaining of poor balance needing to use a walker in the house, fall at least x 1 time, and increasing weakness. She states that she  cannot tolerate the weakness anymore and needs something to be done. She complains of left-sided weakness. Daughter at the bedside said that her speech is actually improved. Denies HA, vertigo, double vision, difficulty swallowing, confusion, slurred speech, language or vision impairment. Serologies significant for Na  31, K 3.2, Cr 1.45. UA without obvious infection. At baseline highly functional able to cook clean and do all of her ADLs and was still driving until 3 weeks ago. She lives with her daughter and has done so for the past 5 years. Patient was not administered TPA secondary to late presentation. She was admitted for further evaluation and treatment. MRI scan of the brain was motion degraded and showed right MCA territory infarct similar to the previous MRI from one week ago. The moderate changes of chronic microvascular ischemia. MRA of the bed brain was also motion degraded but showed occluded right middle cerebral artery and multiple stenosis in the intracranial vessels versus pulsation artifact. CT angiogram of the head and neck showed severe stenosis at the origin of both vertebral arteries. Carotid Doppler showed 40-59% ICA stenosis bilaterally in the neck. Transthoracic echo showed normal ejection fraction without cardiac source of embolism. Transesophageal echo showed dilated left atrial appendage but no cardiac source for embolism or PFO. There was grade 3 atheroma of the distal ascending aorta. LDL cholesterol was elevated at 138. Hemoglobin A1c was 5.6. Patient had loop recorder placed but so far atrial fibrillation has not been found. Patient is at home living with her daughter. She is able to walk with a walker. She has not had any falls. She's had a flare up of her depression and has crying spells and is not sleeping well. She gets up in the middle of the night. She has been treated with antidepressive and some the past but not for long time. She is tolerating aspirin and Plavix without significant bleeding and only minor bruising. She is also tolerating Lipitor without side effects. Patient has an appointment with her primary physician next week  Update 08/13/2016 : She returns for follow-up after last visit 6 months ago. She is accompanied by her daughter [name]. Patient was started on Aricept at  last visit. She is taking 5 mg at night and tolerating it well without any GI or CNS side effects. The daughter feels that her memory may be slightly better. Patient is living at home with her daughter. She is able to walk with a walker independently. Her balance is not great but she has had no falls. The patient does get frustrated and agitated easily. She however has not had any delusions or hallucinations. There have been no unsafe behaviors noted. The patient stated that she is tired of taking too many medications and is requesting that she be taken off the ones which are not necessary. REVIEW OF SYSTEMS: Out of a complete 14 system review of symptoms, the patient complains only of the following symptoms, and all other reviewed systems are negative.  Cough, walking difficulty, joint pain, memory loss  ALLERGIES: Allergies  Allergen Reactions  . Codeine Itching  . Simvastatin Itching    HOME MEDICATIONS: Outpatient Medications Prior to Visit  Medication Sig Dispense Refill  . acetaminophen (TYLENOL) 325 MG tablet Take 2 tablets (650 mg total) by mouth every 6 (six) hours as needed for mild pain, moderate pain, fever or headache (or Fever >/= 101).    Marland Kitchen. atorvastatin (LIPITOR) 20 MG tablet Take 1 tablet (20 mg total) by mouth daily.    .Marland Kitchen  buPROPion (WELLBUTRIN SR) 100 MG 12 hr tablet TAKE 1 TABLET (100 MG TOTAL) BY MOUTH daily  3  . clopidogrel (PLAVIX) 75 MG tablet Take 1 tablet (75 mg total) by mouth daily.    Marland Kitchen ENSURE PLUS (ENSURE PLUS) LIQD Take 237 mLs by mouth daily.    Marland Kitchen oxyCODONE-acetaminophen (PERCOCET/ROXICET) 5-325 MG tablet Take 1 tablet by mouth every 4 (four) hours as needed for severe pain. 10 tablet 0  . citalopram (CELEXA) 20 MG tablet Take 20 mg by mouth. May increase at later date.    . donepezil (ARICEPT) 5 MG tablet Take 1 tablet (5 mg total) by mouth at bedtime. 30 tablet 5   No facility-administered medications prior to visit.     PAST MEDICAL HISTORY: Past Medical  History:  Diagnosis Date  . ARTHRITIS, LEFT HIP 04/18/2008  . CATARACTS, BILATERAL 04/18/2008  . COPD 04/18/2008  . DEPRESSION 07/08/2007  . GERD 07/08/2007  . HYPERTENSION 07/08/2007  . Stroke (HCC)   . 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 COLOSTOMY     partial  . EP IMPLANTABLE DEVICE N/A 06/18/2015   Procedure: Loop Recorder Insertion;  Surgeon: Marinus Maw, MD;  Location: [facility] INVASIVE CV LAB;  Service: Cardiovascular;  Laterality: N/A;  . TEE WITHOUT CARDIOVERSION N/A 06/18/2015   Procedure: TRANSESOPHAGEAL ECHOCARDIOGRAM (TEE);  Surgeon: Chrystie Nose, MD;  Location[facility] ENDOSCOPY;  Service: Cardiovascular;  Laterality: N/A;    FAMILY HISTORY: Family History  Problem Relation Age of Onset  . Diabetes Other   . Hypertension Other   . Cancer Other     prostate  . Heart disease Other     SOCIAL HISTORY: Social History   Social History  . Marital status: Widowed    Spouse name: N/A  . Number of children: N/A  . Years of education: N/A   Occupational History  . Not on file.   Social History Main Topics  . Smoking status: Former Games developer  . Smokeless tobacco: Never Used     Comment: quit 15 yrs ago.    . Alcohol use No  . Drug use: No  . Sexual activity: Not on file   Other Topics Concern  . Not on file   Social History Narrative  . No narrative on file      PHYSICAL EXAM  Vitals:   08/13/16 0903  BP: (!) 99/55  Pulse: 67  Weight: 105 lb 12.8 oz (48 kg)  Height: 5\' 2"  (1.575 m)   Body mass index is 19.35 kg/m.   MMSE - Mini Mental State Exam 02/13/2016 10/14/2015  Orientation to time 3 0  Orientation to Place 3 3  Registration 3 3  Attention/ Calculation 0 0  Recall 0 0  Language- name 2 objects 2 2  Language- repeat 1 1  Language- follow 3 step command 3 3  Language- read & follow direction 1 1  Write a sentence 1 0  Copy design 0 0  Total score 17  13     Generalized: Well developed, in no acute distress   Neurological examination  Mentation: Alert . Follows all commands speech and language fluent.Diminished attention, registration and recall. Follows two-step commands. Mini-Mental status exam not done today. Patient is emotionally labile and cries constantly during this visit. Cranial nerve II-XII: Pupils were equal round reactive to light. Extraocular movements were full, visual field were full on confrontational test. Facial sensation  and strength were normal. Uvula tongue midline. Head turning and shoulder shrug  were normal and symmetric. Motor: The motor testing reveals 5 over 5 strength in all extremities except 4/ 5 strength in the left lower extremity and mild left grip weakness and intrinsic hand muscle weakness. Good symmetric motor tone is noted throughout.  Sensory: Sensory testing is intact to soft touch on all 4 extremities but subjective diminished on the left hemibody touch and pinprick sensation.. No evidence of extinction is noted.  Coordination: Cerebellar testing reveals good finger-nose-finger and heel-to-shin bilaterally.  Gait and station: Patient is in a wheelchair today. Reflexes: Deep tendon reflexes are symmetric and normal bilaterally.   DIAGNOSTIC DATA (LABS, IMAGING, TESTING) - I reviewed patient records, labs, notes, testing and imaging myself where available.  Lab Results  Component Value Date   WBC 6.7 03/12/2016   HGB 11.6 (L) 03/12/2016   HCT 35.9 (L) 03/12/2016   MCV 96.2 03/12/2016   PLT 217 03/12/2016      Component Value Date/Time   NA 139 03/12/2016 0756   NA 141 02/13/2016 1638   K 3.9 03/12/2016 0756   CL 102 03/12/2016 0756   CO2 26 03/12/2016 0756   GLUCOSE 99 03/12/2016 0756   BUN 19 03/12/2016 0756   BUN 26 02/13/2016 1638   CREATININE 1.34 (H) 03/12/2016 0756   CALCIUM 11.2 (H) 03/12/2016 0756   CALCIUM 11.6 (H) 11/07/2010 2208   PROT 6.7 02/13/2016 1638   ALBUMIN 4.1  02/13/2016 1638   AST 16 02/13/2016 1638   ALT 8 02/13/2016 1638   ALKPHOS 91 02/13/2016 1638   BILITOT 0.2 02/13/2016 1638   GFRNONAA 35 (L) 03/12/2016 0756   GFRAA 41 (L) 03/12/2016 0756   Lab Results  Component Value Date   CHOL 209 (H) 06/17/2015   HDL 52 06/17/2015   LDLCALC 138 (H) 06/17/2015   LDLDIRECT 173.0 04/11/2007   TRIG 96 06/17/2015   CHOLHDL 4.0 06/17/2015   Lab Results  Component Value Date   HGBA1C 5.6 06/17/2015        ASSESSMENT AND PLAN 82 y.o. year old female  has a past medical history of ARTHRITIS, LEFT HIP (04/18/2008); CATARACTS, BILATERAL (04/18/2008); COPD (04/18/2008); DEPRESSION (07/08/2007); GERD (07/08/2007); HYPERTENSION (07/08/2007); Stroke [facility]); and Unspecified disorder of parathyroid gland (11/14/2010). here with:  1. History of stroke 2. Moderate dementia of alzheimer`s type   I had a long discussion with the patient and daughter regarding her remote stroke as well as Alzheimer's dementia. I recommend she increase Aricept dose to 10 mg at night. Discontinue Celexa as she is already on Wellbutrin and on is requesting her  medications to be reduced. I also advised her to see her primary physician Dr. Parke Simmers to discuss reduction of blood pressure medication if possible. Continue Plavix for stroke prevention with strict control of hypertension blood pressure goal below 140/90 and lipids with LDL cholesterol goal below 70 mg percent. Continue to use walker at all times and fall and safety precautions. Return for follow-up in 6 months with nurse practitioner or call earlier if necessary.  Delia Heady, MD  08/13/2016, 1:14 PM [facility] [address], [address] [place], [place] [address] [phone]

## 2016-08-25 DIAGNOSIS — Z0000 Encounter for general adult medical examination without abnormal findings: Secondary | ICD-10-CM | POA: Diagnosis not present

## 2016-08-25 DIAGNOSIS — I10 Essential (primary) hypertension: Secondary | ICD-10-CM | POA: Diagnosis not present

## 2016-08-25 DIAGNOSIS — I639 Cerebral infarction, unspecified: Secondary | ICD-10-CM | POA: Diagnosis not present

## 2016-09-05 ENCOUNTER — Other Ambulatory Visit: Payer: Self-pay | Admitting: Neurology

## 2016-09-07 ENCOUNTER — Other Ambulatory Visit: Payer: Self-pay

## 2016-09-07 MED ORDER — BUPROPION HCL ER (SR) 100 MG PO TB12: ORAL_TABLET | ORAL | 3 refills | Status: DC

## 2016-09-14 DIAGNOSIS — Z8673 Personal history of transient ischemic attack (TIA), and cerebral infarction without residual deficits: Secondary | ICD-10-CM | POA: Diagnosis not present

## 2016-09-14 DIAGNOSIS — R413 Other amnesia: Secondary | ICD-10-CM | POA: Diagnosis not present

## 2016-09-14 DIAGNOSIS — H5462 Unqualified visual loss, left eye, normal vision right eye: Secondary | ICD-10-CM | POA: Diagnosis not present

## 2016-09-14 DIAGNOSIS — I10 Essential (primary) hypertension: Secondary | ICD-10-CM | POA: Diagnosis not present

## 2016-09-14 DIAGNOSIS — Z7982 Long term (current) use of aspirin: Secondary | ICD-10-CM | POA: Diagnosis not present

## 2016-09-14 DIAGNOSIS — Z7902 Long term (current) use of antithrombotics/antiplatelets: Secondary | ICD-10-CM | POA: Diagnosis not present

## 2016-09-14 DIAGNOSIS — M130 Polyarthritis, unspecified: Secondary | ICD-10-CM | POA: Diagnosis not present

## 2016-09-16 DIAGNOSIS — Z8673 Personal history of transient ischemic attack (TIA), and cerebral infarction without residual deficits: Secondary | ICD-10-CM | POA: Diagnosis not present

## 2016-09-16 DIAGNOSIS — Z7982 Long term (current) use of aspirin: Secondary | ICD-10-CM | POA: Diagnosis not present

## 2016-09-16 DIAGNOSIS — R413 Other amnesia: Secondary | ICD-10-CM | POA: Diagnosis not present

## 2016-09-16 DIAGNOSIS — M130 Polyarthritis, unspecified: Secondary | ICD-10-CM | POA: Diagnosis not present

## 2016-09-16 DIAGNOSIS — Z7902 Long term (current) use of antithrombotics/antiplatelets: Secondary | ICD-10-CM | POA: Diagnosis not present

## 2016-09-16 DIAGNOSIS — I10 Essential (primary) hypertension: Secondary | ICD-10-CM | POA: Diagnosis not present

## 2016-09-16 DIAGNOSIS — H5462 Unqualified visual loss, left eye, normal vision right eye: Secondary | ICD-10-CM | POA: Diagnosis not present

## 2016-09-17 DIAGNOSIS — Z7902 Long term (current) use of antithrombotics/antiplatelets: Secondary | ICD-10-CM | POA: Diagnosis not present

## 2016-09-17 DIAGNOSIS — H5462 Unqualified visual loss, left eye, normal vision right eye: Secondary | ICD-10-CM | POA: Diagnosis not present

## 2016-09-17 DIAGNOSIS — I10 Essential (primary) hypertension: Secondary | ICD-10-CM | POA: Diagnosis not present

## 2016-09-17 DIAGNOSIS — M130 Polyarthritis, unspecified: Secondary | ICD-10-CM | POA: Diagnosis not present

## 2016-09-17 DIAGNOSIS — R413 Other amnesia: Secondary | ICD-10-CM | POA: Diagnosis not present

## 2016-09-17 DIAGNOSIS — Z8673 Personal history of transient ischemic attack (TIA), and cerebral infarction without residual deficits: Secondary | ICD-10-CM | POA: Diagnosis not present

## 2016-09-17 DIAGNOSIS — Z7982 Long term (current) use of aspirin: Secondary | ICD-10-CM | POA: Diagnosis not present

## 2016-09-23 DIAGNOSIS — Z8673 Personal history of transient ischemic attack (TIA), and cerebral infarction without residual deficits: Secondary | ICD-10-CM | POA: Diagnosis not present

## 2016-09-23 DIAGNOSIS — Z7982 Long term (current) use of aspirin: Secondary | ICD-10-CM | POA: Diagnosis not present

## 2016-09-23 DIAGNOSIS — H5462 Unqualified visual loss, left eye, normal vision right eye: Secondary | ICD-10-CM | POA: Diagnosis not present

## 2016-09-23 DIAGNOSIS — I10 Essential (primary) hypertension: Secondary | ICD-10-CM | POA: Diagnosis not present

## 2016-09-23 DIAGNOSIS — R413 Other amnesia: Secondary | ICD-10-CM | POA: Diagnosis not present

## 2016-09-23 DIAGNOSIS — M130 Polyarthritis, unspecified: Secondary | ICD-10-CM | POA: Diagnosis not present

## 2016-09-23 DIAGNOSIS — Z7902 Long term (current) use of antithrombotics/antiplatelets: Secondary | ICD-10-CM | POA: Diagnosis not present

## 2016-10-01 DIAGNOSIS — R413 Other amnesia: Secondary | ICD-10-CM | POA: Diagnosis not present

## 2016-10-01 DIAGNOSIS — Z7902 Long term (current) use of antithrombotics/antiplatelets: Secondary | ICD-10-CM | POA: Diagnosis not present

## 2016-10-01 DIAGNOSIS — I10 Essential (primary) hypertension: Secondary | ICD-10-CM | POA: Diagnosis not present

## 2016-10-01 DIAGNOSIS — M130 Polyarthritis, unspecified: Secondary | ICD-10-CM | POA: Diagnosis not present

## 2016-10-01 DIAGNOSIS — Z7982 Long term (current) use of aspirin: Secondary | ICD-10-CM | POA: Diagnosis not present

## 2016-10-01 DIAGNOSIS — Z8673 Personal history of transient ischemic attack (TIA), and cerebral infarction without residual deficits: Secondary | ICD-10-CM | POA: Diagnosis not present

## 2016-10-01 DIAGNOSIS — H5462 Unqualified visual loss, left eye, normal vision right eye: Secondary | ICD-10-CM | POA: Diagnosis not present

## 2016-10-07 DIAGNOSIS — I10 Essential (primary) hypertension: Secondary | ICD-10-CM | POA: Diagnosis not present

## 2016-10-07 DIAGNOSIS — H5462 Unqualified visual loss, left eye, normal vision right eye: Secondary | ICD-10-CM | POA: Diagnosis not present

## 2016-10-07 DIAGNOSIS — Z7902 Long term (current) use of antithrombotics/antiplatelets: Secondary | ICD-10-CM | POA: Diagnosis not present

## 2016-10-07 DIAGNOSIS — Z7982 Long term (current) use of aspirin: Secondary | ICD-10-CM | POA: Diagnosis not present

## 2016-10-07 DIAGNOSIS — R413 Other amnesia: Secondary | ICD-10-CM | POA: Diagnosis not present

## 2016-10-07 DIAGNOSIS — Z8673 Personal history of transient ischemic attack (TIA), and cerebral infarction without residual deficits: Secondary | ICD-10-CM | POA: Diagnosis not present

## 2016-10-07 DIAGNOSIS — M130 Polyarthritis, unspecified: Secondary | ICD-10-CM | POA: Diagnosis not present

## 2016-10-10 DIAGNOSIS — R112 Nausea with vomiting, unspecified: Secondary | ICD-10-CM | POA: Diagnosis not present

## 2016-10-10 DIAGNOSIS — J399 Disease of upper respiratory tract, unspecified: Secondary | ICD-10-CM | POA: Diagnosis not present

## 2016-10-10 DIAGNOSIS — I10 Essential (primary) hypertension: Secondary | ICD-10-CM | POA: Diagnosis not present

## 2016-10-13 ENCOUNTER — Other Ambulatory Visit: Payer: Self-pay

## 2016-10-13 MED ORDER — BUPROPION HCL ER (SR) 100 MG PO TB12
ORAL_TABLET | ORAL | 3 refills | Status: DC
Start: 2016-10-13 — End: 2017-02-23

## 2016-10-15 DIAGNOSIS — I10 Essential (primary) hypertension: Secondary | ICD-10-CM | POA: Diagnosis not present

## 2016-10-15 DIAGNOSIS — Z8673 Personal history of transient ischemic attack (TIA), and cerebral infarction without residual deficits: Secondary | ICD-10-CM | POA: Diagnosis not present

## 2016-10-15 DIAGNOSIS — Z7982 Long term (current) use of aspirin: Secondary | ICD-10-CM | POA: Diagnosis not present

## 2016-10-15 DIAGNOSIS — H5462 Unqualified visual loss, left eye, normal vision right eye: Secondary | ICD-10-CM | POA: Diagnosis not present

## 2016-10-15 DIAGNOSIS — M130 Polyarthritis, unspecified: Secondary | ICD-10-CM | POA: Diagnosis not present

## 2016-10-15 DIAGNOSIS — R413 Other amnesia: Secondary | ICD-10-CM | POA: Diagnosis not present

## 2016-10-15 DIAGNOSIS — Z7902 Long term (current) use of antithrombotics/antiplatelets: Secondary | ICD-10-CM | POA: Diagnosis not present

## 2016-10-19 DIAGNOSIS — I10 Essential (primary) hypertension: Secondary | ICD-10-CM | POA: Diagnosis not present

## 2016-10-19 DIAGNOSIS — Z8673 Personal history of transient ischemic attack (TIA), and cerebral infarction without residual deficits: Secondary | ICD-10-CM | POA: Diagnosis not present

## 2016-10-19 DIAGNOSIS — M130 Polyarthritis, unspecified: Secondary | ICD-10-CM | POA: Diagnosis not present

## 2016-10-19 DIAGNOSIS — Z7902 Long term (current) use of antithrombotics/antiplatelets: Secondary | ICD-10-CM | POA: Diagnosis not present

## 2016-10-19 DIAGNOSIS — R413 Other amnesia: Secondary | ICD-10-CM | POA: Diagnosis not present

## 2016-10-19 DIAGNOSIS — Z7982 Long term (current) use of aspirin: Secondary | ICD-10-CM | POA: Diagnosis not present

## 2016-10-19 DIAGNOSIS — H5462 Unqualified visual loss, left eye, normal vision right eye: Secondary | ICD-10-CM | POA: Diagnosis not present

## 2016-10-26 DIAGNOSIS — H2512 Age-related nuclear cataract, left eye: Secondary | ICD-10-CM | POA: Diagnosis not present

## 2016-10-26 DIAGNOSIS — H902 Conductive hearing loss, unspecified: Secondary | ICD-10-CM | POA: Diagnosis not present

## 2016-10-26 DIAGNOSIS — I10 Essential (primary) hypertension: Secondary | ICD-10-CM | POA: Diagnosis not present

## 2016-10-26 DIAGNOSIS — R410 Disorientation, unspecified: Secondary | ICD-10-CM | POA: Diagnosis not present

## 2016-10-26 DIAGNOSIS — H539 Unspecified visual disturbance: Secondary | ICD-10-CM | POA: Diagnosis not present

## 2016-10-26 DIAGNOSIS — I639 Cerebral infarction, unspecified: Secondary | ICD-10-CM | POA: Diagnosis not present

## 2016-12-04 DIAGNOSIS — I10 Essential (primary) hypertension: Secondary | ICD-10-CM | POA: Diagnosis not present

## 2016-12-04 DIAGNOSIS — M130 Polyarthritis, unspecified: Secondary | ICD-10-CM | POA: Diagnosis not present

## 2016-12-04 DIAGNOSIS — I639 Cerebral infarction, unspecified: Secondary | ICD-10-CM | POA: Diagnosis not present

## 2016-12-22 ENCOUNTER — Encounter (HOSPITAL_COMMUNITY): Payer: Self-pay | Admitting: Emergency Medicine

## 2016-12-22 ENCOUNTER — Emergency Department (HOSPITAL_COMMUNITY): Payer: Medicare Other

## 2016-12-22 ENCOUNTER — Emergency Department (HOSPITAL_COMMUNITY)
Admission: EM | Admit: 2016-12-22 | Discharge: 2016-12-22 | Disposition: A | Discharge status: Home / Self Care | Payer: Medicare Other | Attending: Emergency Medicine | Admitting: Emergency Medicine

## 2016-12-22 DIAGNOSIS — Z87891 Personal history of nicotine dependence: Secondary | ICD-10-CM | POA: Diagnosis not present

## 2016-12-22 DIAGNOSIS — M545 Low back pain: Secondary | ICD-10-CM | POA: Diagnosis not present

## 2016-12-22 DIAGNOSIS — M5442 Lumbago with sciatica, left side: Secondary | ICD-10-CM | POA: Insufficient documentation

## 2016-12-22 DIAGNOSIS — R935 Abnormal findings on diagnostic imaging of other abdominal regions, including retroperitoneum: Secondary | ICD-10-CM | POA: Diagnosis not present

## 2016-12-22 DIAGNOSIS — R519 Headache, unspecified: Secondary | ICD-10-CM

## 2016-12-22 DIAGNOSIS — M5441 Lumbago with sciatica, right side: Secondary | ICD-10-CM | POA: Insufficient documentation

## 2016-12-22 DIAGNOSIS — Z8673 Personal history of transient ischemic attack (TIA), and cerebral infarction without residual deficits: Secondary | ICD-10-CM | POA: Insufficient documentation

## 2016-12-22 DIAGNOSIS — R51 Headache: Secondary | ICD-10-CM | POA: Diagnosis not present

## 2016-12-22 DIAGNOSIS — M5489 Other dorsalgia: Secondary | ICD-10-CM | POA: Diagnosis not present

## 2016-12-22 DIAGNOSIS — J449 Chronic obstructive pulmonary disease, unspecified: Secondary | ICD-10-CM | POA: Diagnosis not present

## 2016-12-22 DIAGNOSIS — G8929 Other chronic pain: Secondary | ICD-10-CM

## 2016-12-22 DIAGNOSIS — R700 Elevated erythrocyte sedimentation rate: Secondary | ICD-10-CM | POA: Diagnosis not present

## 2016-12-22 DIAGNOSIS — I10 Essential (primary) hypertension: Secondary | ICD-10-CM | POA: Diagnosis not present

## 2016-12-22 DIAGNOSIS — M25552 Pain in left hip: Secondary | ICD-10-CM | POA: Diagnosis not present

## 2016-12-22 DIAGNOSIS — R1030 Lower abdominal pain, unspecified: Secondary | ICD-10-CM | POA: Diagnosis not present

## 2016-12-22 LAB — C-REACTIVE PROTEIN: CRP: <0.8 mg/dL (ref <1.0)

## 2016-12-22 LAB — COMPREHENSIVE METABOLIC PANEL
ALT: 15 U/L (ref 14–54)
AST: 25 U/L (ref 15–41)
Albumin: 4 g/dL (ref 3.5–5.0)
Alkaline Phosphatase: 85 U/L (ref 38–126)
Anion gap: 6 (ref 5–15)
BILIRUBIN TOTAL: 0.6 mg/dL (ref 0.3–1.2)
BUN: 33 mg/dL — AB (ref 6–20)
CHLORIDE: 105 mmol/L (ref 101–111)
CO2: 25 mmol/L (ref 22–32)
CREATININE: 1.93 mg/dL — AB (ref 0.44–1.00)
Calcium: 10.5 mg/dL — ABNORMAL HIGH (ref 8.9–10.3)
GFR, EST AFRICAN AMERICAN: 26 mL/min — AB (ref >60)
GFR, EST NON AFRICAN AMERICAN: 23 mL/min — AB (ref >60)
Glucose, Bld: 81 mg/dL (ref 65–99)
Potassium: 4.2 mmol/L (ref 3.5–5.1)
Sodium: 136 mmol/L (ref 135–145)
TOTAL PROTEIN: 7.3 g/dL (ref 6.5–8.1)

## 2016-12-22 LAB — URINALYSIS, ROUTINE W REFLEX MICROSCOPIC
BILIRUBIN URINE: NEGATIVE
Glucose, UA: NEGATIVE mg/dL
HGB URINE DIPSTICK: NEGATIVE
KETONES UR: NEGATIVE mg/dL
Leukocytes, UA: NEGATIVE
NITRITE: NEGATIVE
PROTEIN: NEGATIVE mg/dL
SPECIFIC GRAVITY, URINE: 1.008 (ref 1.005–1.030)
pH: 6 (ref 5.0–8.0)

## 2016-12-22 LAB — CBC
HEMATOCRIT: 27.5 % — AB (ref 36.0–46.0)
Hemoglobin: 8.9 g/dL — ABNORMAL LOW (ref 12.0–15.0)
MCH: 28.4 pg (ref 26.0–34.0)
MCHC: 32.4 g/dL (ref 30.0–36.0)
MCV: 87.9 fL (ref 78.0–100.0)
PLATELETS: 292 10*3/uL (ref 150–400)
RBC: 3.13 MIL/uL — AB (ref 3.87–5.11)
RDW: 15.1 % (ref 11.5–15.5)
WBC: 5.4 10*3/uL (ref 4.0–10.5)

## 2016-12-22 LAB — I-STAT CG4 LACTIC ACID, ED
LACTIC ACID, VENOUS: 1.04 mmol/L (ref 0.5–1.9)
Lactic Acid, Venous: 0.75 mmol/L (ref 0.5–1.9)

## 2016-12-22 LAB — SEDIMENTATION RATE: SED RATE: 50 mm/h — AB (ref 0–22)

## 2016-12-22 MED ORDER — OXYCODONE HCL 5 MG PO TABS: 2.5000 mg | ORAL_TABLET | ORAL | 0 refills | Status: DC | PRN

## 2016-12-22 MED ORDER — IOPAMIDOL (ISOVUE-300) INJECTION 61%
INTRAVENOUS | Status: AC
  Filled 2016-12-22: qty 30

## 2016-12-22 MED ORDER — SODIUM CHLORIDE 0.9 % IV BOLUS (SEPSIS)
1000.0000 mL | Freq: Once | INTRAVENOUS | Status: AC
  Administered 2016-12-22: 1000 mL via INTRAVENOUS

## 2016-12-22 MED ORDER — OXYCODONE HCL 5 MG PO TABS
2.5000 mg | ORAL_TABLET | Freq: Once | ORAL | Status: AC
  Administered 2016-12-22: 2.5 mg via ORAL
  Filled 2016-12-22: qty 1

## 2016-12-22 MED ORDER — FENTANYL CITRATE (PF) 100 MCG/2ML IJ SOLN
50.0000 ug | Freq: Once | INTRAMUSCULAR | Status: AC
  Administered 2016-12-22: 50 ug via INTRAVENOUS
  Filled 2016-12-22: qty 2

## 2016-12-22 MED ORDER — PREDNISONE 20 MG PO TABS: 20.0000 mg | ORAL_TABLET | Freq: Every day | ORAL | 0 refills | Status: AC

## 2016-12-22 MED ORDER — IOPAMIDOL (ISOVUE-300) INJECTION 61%
15.0000 mL | Freq: Two times a day (BID) | INTRAVENOUS | Status: DC | PRN
  Administered 2016-12-22: 15 mL via ORAL
  Filled 2016-12-22: qty 30

## 2016-12-22 NOTE — ED Provider Notes (Signed)
83 year old female placed in fast track as no immediate beds were available. Medical extremity exam shows patient having left-sided headache she describes as severe, severe left hip pain very uncomfortable in exam bed unable to place weight on left hip. Discussed with charge nurse in triage nurse that patient is not appropriate for fast track and is uncomfortable and needs placement in the acute side for further evaluation. Initial screening tests will be ordered.   Eyvonne MechanicJeffrey Abigale Dorow, PA-C 12/22/16 1425    Lyndal Pulleyaniel Knott, MD 12/22/16 2259

## 2016-12-22 NOTE — ED Notes (Signed)
Urine sample missed the collection hat.  Pt given food and water will try to recollect

## 2016-12-22 NOTE — ED Notes (Signed)
Bed: WA08 Expected date:  Expected time:  Means of arrival:  Comments: hold 

## 2016-12-22 NOTE — ED Triage Notes (Signed)
Per [facility] pt from home reports lower back pain , Hx 2 surgeries . No urinary symptoms . Hx stroke left side deficit . Alert and oriented x 4. Hx dementia.

## 2016-12-22 NOTE — ED Provider Notes (Signed)
[facility]-EMERGENCY DEPT Provider Note   CSN: [id number] Arrival date & time: 12/22/16  1154     History   Chief Complaint Chief Complaint  Patient presents with  . Back Pain    lower     HPI Adriana Middleton is a 83 y.o. female.  HPI 83 yo F with PMHx of HTN, HLD, h/o CVA who p/w multiple pain complaints.  Headache - pt states that "for years," she has had a sharp, intermittent, left-sided headache that comes and goes. It begins acutely, "shoots" across her face, then resolves. It is mildly tender to touch throughout her face when HA begins. Denies any associated vision changes. No associated numbness, weakness. No facial sweating or other changes. No ear pain. Headache increasing in frequency so she is here today. Has not seen a Neurologist.  Back pain/hip pain - Pt endorses chronic, severe left hip and back pain that has been present "for years" and she is now unable to walk 2/2 this pain. It is aching, throbbing, and severe. Worse with any movement or palpation. She states her hip "clicks" with any movement "for years." This pain is not acutely worsened. Denies any new or worsened pain with movement of hip.  Past Medical History:  Diagnosis Date  . ARTHRITIS, LEFT HIP 04/18/2008  . CATARACTS, BILATERAL 04/18/2008  . COPD 04/18/2008  . DEPRESSION 07/08/2007  . GERD 07/08/2007  . HYPERTENSION 07/08/2007  . Stroke (HCC)   . 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 Renal failure (ARF), acute on chronic (HCC)   . Troponin level elevated 09/22/2015  . Acute renal failure (ARF) (HCC) 09/22/2015  . Hypercalcemia 09/22/2015  . Acute encephalopathy 09/22/2015  . DNR (do not resuscitate) 09/22/2015  . Palliative care status 09/22/2015  . Hypokalemia 09/22/2015  . Loss of weight 09/22/2015  . Embolic stroke (HCC) 07/19/2015  . Aortic atherosclerosis (HCC)   . Carotid stenosis   . Stroke (HCC)   . HLD (hyperlipidemia)   .  Left-sided weakness 06/16/2015  . Acute right arterial ischemic stroke, MCA (middle cerebral artery) (HCC) 06/16/2015  . Facial weakness 06/10/2015  . TIA (transient ischemic attack) 06/10/2015  . UNSPECIFIED DISORDER OF PARATHYROID GLAND 11/14/2010  . CATARACTS, BILATERAL 04/18/2008  . COPD 04/18/2008  . ARTHRITIS, LEFT HIP 04/18/2008  . DEPRESSION 07/08/2007  . Essential hypertension 07/08/2007  . GERD 07/08/2007    Past Surgical History:  Procedure Laterality Date  . COLOSTOMY     partial  . EP IMPLANTABLE DEVICE N/A 06/18/2015   Procedure: Loop Recorder Insertion;  Surgeon: Marinus Maw, MD;  Location: [facility] INVASIVE CV LAB;  Service: Cardiovascular;  Laterality: N/A;  . TEE WITHOUT CARDIOVERSION N/A 06/18/2015   Procedure: TRANSESOPHAGEAL ECHOCARDIOGRAM (TEE);  Surgeon: Chrystie Nose, MD;  Location: [facility] ENDOSCOPY;  Service: Cardiovascular;  Laterality: N/A;    OB History    No data available       Home Medications    Prior to Admission medications   Medication Sig Start Date End Date Taking? Authorizing Provider  acetaminophen (TYLENOL) 325 MG tablet Take 2 tablets (650 mg total) by mouth every 6 (six) hours as needed for mild pain, moderate pain, fever or headache (or Fever >/= 101). 09/23/15  Yes Elease Etienne, MD  amLODipine (NORVASC) 5 MG tablet Take 5 mg by mouth at bedtime.  08/01/16  Yes Historical Provider, MD  atorvastatin (LIPITOR) 20 MG tablet Take 1  tablet (20 mg total) by mouth daily. 07/19/15  Yes Micki Riley, MD  buPROPion (WELLBUTRIN SR) 100 MG 12 hr tablet TAKE 1 TABLET (100 MG TOTAL) BY MOUTH daily 10/13/16  Yes Micki Riley, MD  clopidogrel (PLAVIX) 75 MG tablet Take 1 tablet (75 mg total) by mouth daily. 07/19/15  Yes Micki Riley, MD  donepezil (ARICEPT) 10 MG tablet Take 1 tablet (10 mg total) by mouth at bedtime. 08/13/16  Yes Micki Riley, MD  telmisartan-hydrochlorothiazide (MICARDIS HCT) 40-12.5 MG tablet  07/15/16  Yes Historical Provider, MD   ENSURE PLUS (ENSURE PLUS) LIQD Take 237 mLs by mouth daily. 06/24/15   Historical Provider, MD  oxyCODONE (ROXICODONE) 5 MG immediate release tablet Take 0.5-1 tablets (2.5-5 mg total) by mouth every 4 (four) hours as needed for severe pain or breakthrough pain. 12/22/16   Shaune Pollack, MD  oxyCODONE-acetaminophen (PERCOCET/ROXICET) 5-325 MG tablet Take 1 tablet by mouth every 4 (four) hours as needed for severe pain. Patient not taking: Reported on 12/22/2016 03/12/16   Glynn Octave, MD  predniSONE (DELTASONE) 20 MG tablet Take 1 tablet (20 mg total) by mouth daily. 12/22/16 12/27/16  Shaune Pollack, MD    Family History Family History  Problem Relation Age of Onset  . Diabetes Other   . Hypertension Other   . Cancer Other     prostate  . Heart disease Other     Social History Social History  Substance Use Topics  . Smoking status: Former Games developer  . Smokeless tobacco: Never Used     Comment: quit 15 yrs ago.    . Alcohol use No     Allergies   Codeine and Simvastatin   Review of Systems Review of Systems  Constitutional: Negative for chills and fever.  HENT: Negative for congestion, rhinorrhea and sore throat.   Eyes: Negative for visual disturbance.  Respiratory: Negative for cough, shortness of breath and wheezing.   Cardiovascular: Negative for chest pain and leg swelling.  Gastrointestinal: Negative for abdominal pain, diarrhea, nausea and vomiting.  Genitourinary: Negative for dysuria, flank pain, vaginal bleeding and vaginal discharge.  Musculoskeletal: Positive for arthralgias and gait problem. Negative for joint swelling and neck pain.  Skin: Negative for rash.  Allergic/Immunologic: Negative for immunocompromised state.  Neurological: Positive for headaches. Negative for syncope, weakness and numbness.  Hematological: Does not bruise/bleed easily.  All other systems reviewed and are negative.    Physical Exam Updated Vital Signs BP 175/76   Pulse 81    Temp 98.5 F (36.9 C) (Oral)   Resp 16   SpO2 96%   Physical Exam  Constitutional: She is oriented to person, place, and time. She appears well-developed and well-nourished. No distress.  HENT:  Head: Normocephalic and atraumatic.  Diffuse subjective TTP throughout left hemiface. No TTP over temporal artery in particular.  Eyes: Conjunctivae are normal.  Neck: Neck supple.  Cardiovascular: Normal rate, regular rhythm and normal heart sounds.  Exam reveals no friction rub.   No murmur heard. Pulmonary/Chest: Effort normal and breath sounds normal. No respiratory distress. She has no wheezes. She has no rales.  Abdominal: Soft. Bowel sounds are normal. She exhibits no distension.  Musculoskeletal: She exhibits no edema.  Neurological: She is alert and oriented to person, place, and time. She exhibits normal muscle tone.  Skin: Skin is warm. Capillary refill takes less than 2 seconds.  Psychiatric: She has a normal mood and affect.  Nursing note and vitals reviewed.   Neurological  Exam:  Mental Status: Alert and oriented to person, place, and time. Attention and concentration normal. Speech clear. Recent memory is intact. Cranial Nerves: Visual fields grossly intact. EOMI and PERRLA. No nystagmus noted. Facial sensation intact at forehead, maxillary cheek, and chin/mandible bilaterally. No facial asymmetry or weakness. Hearing grossly normal. Uvula is midline, and palate elevates symmetrically. Normal SCM and trapezius strength. Tongue midline without fasciculations. Motor: Muscle strength 5/5 in proximal and distal UE and LE bilaterally. No pronator drift. Muscle tone normal. Reflexes: 2+ and symmetrical in all four extremities.  Sensation: Intact to light touch in upper and lower extremities distally bilaterally.  Gait: Deferred Coordination: Normal FTN bilaterally.   LOWER EXTREMITY EXAM: LEFT  INSPECTION & PALPATION: No gross deformity.  No swelling.  No open wounds.  Mild TTP  throughout entire left hip Palpable/audible clicking heard with pROM of hip, but minimal pain No joint warmth or erythema   SENSORY: sensation is intact to light touch in:  Superficial peroneal nerve distribution (over dorsum of foot) Deep peroneal nerve distribution (over first dorsal web space) Sural nerve distribution (over lateral aspect 5th metatarsal) Saphenous nerve distribution (over medial instep)  MOTOR:  + Motor EHL (great toe dorsiflexion) + FHL (great toe plantar flexion)  + TA (ankle dorsiflexion)  + GSC (ankle plantar flexion)  VASCULAR: 2+ dorsalis pedis and posterior tibialis pulses Capillary refill < 2 sec, toes warm and well-perfused  COMPARTMENTS: Soft, warm, well-perfused No pain with passive extension No parethesias   ED Treatments / Results  Labs (all labs ordered are listed, but only abnormal results are displayed) Labs Reviewed  CBC - Abnormal; Notable for the following:       Result Value   RBC 3.13 (*)    Hemoglobin 8.9 (*)    HCT 27.5 (*)    All other components within normal limits  COMPREHENSIVE METABOLIC PANEL - Abnormal; Notable for the following:    BUN 33 (*)    Creatinine, Ser 1.93 (*)    Calcium 10.5 (*)    GFR calc non Af Amer 23 (*)    GFR calc Af Amer 26 (*)    All other components within normal limits  URINALYSIS, ROUTINE W REFLEX MICROSCOPIC - Abnormal; Notable for the following:    Color, Urine STRAW (*)    All other components within normal limits  SEDIMENTATION RATE - Abnormal; Notable for the following:    Sed Rate 50 (*)    All other components within normal limits  C-REACTIVE PROTEIN  I-STAT CG4 LACTIC ACID, ED  I-STAT CG4 LACTIC ACID, ED    EKG  EKG Interpretation  Date/Time:  Tuesday December 22 2016 15:00:50 EST Ventricular Rate:  72 PR Interval:    QRS Duration: 70 QT Interval:  400 QTC Calculation: 438 R Axis:   29 Text Interpretation:  Sinus rhythm LAE, consider biatrial enlargement Borderline T  abnormalities, inferior leads No significant change since last tracing Confirmed by POLLINA  MD, CHRISTOPHER [phone]) on 12/23/2016 1:43:00 PM       Radiology Ct Abdomen Pelvis Wo Contrast  Result Date: 12/22/2016 CLINICAL DATA:  Lower back and abdominal pain, history of hypertension, COPD, prior stroke EXAM: CT ABDOMEN AND PELVIS WITHOUT CONTRAST TECHNIQUE: Multidetector CT imaging of the abdomen and pelvis was performed following the standard protocol without IV contrast. Sagittal and coronal MPR images reconstructed from axial data set. Oral contrast was not administered. COMPARISON:  01/20/2005 FINDINGS: Lower chest: Lung bases clear. Hepatobiliary: Gallbladder unremarkable. Question mild central  intrahepatic biliary dilatation. Pancreas: Poorly evaluated due to adjacent bowel. No gross mass or ductal dilatation. Spleen: Normal appearance Adrenals/Urinary Tract: Adrenal glands normal appearance. No definite focal renal abnormalities. No hydronephrosis or ureteral dilatation. Bladder unremarkable. Stomach/Bowel: Stomach incompletely distended, unable to accurately assess gastric wall thickness. Large and small bowel loops grossly normal appearance for technique. Vascular/Lymphatic: Atherosclerotic calcifications aorta including origins of the renal arteries, SMA and celiac artery. Tortuous proximal abdominal aorta without definite aneurysmal dilatation. No adenopathy. Reproductive: Multiple uterine calcifications question calcified leiomyomata. Unremarkable adnexa. Other: No free air free fluid. No hernia or intra-abdominal inflammatory process. Musculoskeletal: Advanced degenerative changes and remodeling of the LEFT hip joint progressive since previous exam with a large LEFT hip joint effusion and acetabulum protrusio. Calcified loose body within LEFT hip joint effusion. Small RIGHT hip joint effusion degenerative changes present. Diffuse osseous demineralization. Degenerative disc disease changes of the  thoracolumbar spine with chronic appearing compression fracture of T12 present since 09/22/2015. IMPRESSION: Aortic atherosclerosis and ectasia without aneurysm. Question mild central intrahepatic biliary dilatation, recommend correlation with LFTs. BILATERAL hip joint effusions LEFT much larger than RIGHT with significant degenerative changes in bone destruction/remodeling at the LEFT hip; if there is clinical concern for septic arthritis recommend joint aspiration. Electronically Signed   By: Ulyses SouthwardMark  Boles M.D.   On: 12/22/2016 17:45   Dg Lumbar Spine Complete  Result Date: 12/22/2016 CLINICAL DATA:  Left hip and lower back pain. Symptoms for 2 years, worse today. EXAM: LUMBAR SPINE - COMPLETE 4+ VIEW COMPARISON:  None. FINDINGS: No evidence of acute fracture. Moderate compression fracture at T12 is chronic based on April 2017 rib radiography. Diffuse degenerative disc narrowing and facet arthropathy with levoscoliosis. Osteopenia. Extensive atherosclerotic calcification with borderline ectatic abdominal aorta measuring up to 25 mm. IMPRESSION: 1. No acute finding. 2. Remote T12 compression fracture with moderate height loss. 3. Scoliosis and diffuse degenerative changes. Electronically Signed   By: Marnee SpringJonathon  Watts M.D.   On: 12/22/2016 14:04   Ct Head Wo Contrast  Result Date: 12/22/2016 CLINICAL DATA:  Headaches EXAM: CT HEAD WITHOUT CONTRAST TECHNIQUE: Contiguous axial images were obtained from the base of the skull through the vertex without intravenous contrast. COMPARISON:  09/22/2015 FINDINGS: Brain: Diffuse atrophic changes are noted. Changes of prior infarct are seen in the deep white matter in the right frontoparietal region stable from the prior exam. No findings to suggest acute hemorrhage, acute infarction or space-occupying mass lesion are noted. Vascular: No hyperdense vessel or unexpected calcification. Skull: Normal. Negative for fracture or focal lesion. Sinuses/Orbits: No acute finding.  Other: None. IMPRESSION: Chronic atrophic and ischemic changes without acute abnormality. Electronically Signed   By: Alcide CleverMark  Lukens M.D.   On: 12/22/2016 14:14   Dg Hip Unilat W Or Wo Pelvis 2-3 Views Left  Result Date: 12/22/2016 CLINICAL DATA:  Left hip and lower back pain. EXAM: DG HIP (WITH OR WITHOUT PELVIS) 2-3V LEFT COMPARISON:  03/12/2016 FINDINGS: Again noted is severe joint space loss in the left hip joint with acetabular protrusio. Degenerative changes and possibly old traumatic changes at the pubic symphysis. Pelvic bony ring appears intact and similar to the previous examination. There is no evidence for an acute left hip fracture. Disc space narrowing and degenerative changes at L4-L5. IMPRESSION: Severe arthropathy in the left hip joint with acetabular protrusio. No acute bone abnormality. Electronically Signed   By: Richarda OverlieAdam  Henn M.D.   On: 12/22/2016 14:11    Procedures Procedures (including critical care time)  Medications Ordered  in ED Medications  oxyCODONE (Oxy IR/ROXICODONE) immediate release tablet 2.5 mg (2.5 mg Oral Given 12/22/16 1540)  sodium chloride 0.9 % bolus 1,000 mL (0 mLs Intravenous Stopped 12/22/16 1841)  fentaNYL (SUBLIMAZE) injection 50 mcg (50 mcg Intravenous Given 12/22/16 1838)     Initial Impression / Assessment and Plan / ED Course  I have reviewed the triage vital signs and the nursing notes.  Pertinent labs & imaging results that were available during my care of the patient were reviewed by me and considered in my medical decision making (see chart for details).     83 yo F with PMHx as above here with acute on chronic hip pain and HA. Regarding her HA, DDx includes primary headache such as tension type headache, migraine, also must consider trigeminal neuralgia, possibly mild GCA/PMR given concomitant axial skeleton arthritis. HA is chronic, did not begin acutely, and do not suspect SAH. No fever, leukocytosis, or s/s meningitis or encephalitis. No  focal neuro deficits to suggest CVA and CT head is neg. Will treat supportively. Regarding hip pain, CT obtained. No evidence of RP hematoma. No significant lumbar pathology. Pt does have pronounced bl hip effusions - discussed with Ortho. Given absence of fever, leukocytosis, and normal CRP, low suspicion for septic arthritis. Will treat with low dose steroids for likely mild inflammation 2/2 severe OA, and d/c with outpt f/u. Return precautions given.  Final Clinical Impressions(s) / ED Diagnoses   Final diagnoses:  Facial pain  Chronic bilateral low back pain with bilateral sciatica  Elevated erythrocyte sedimentation rate    New Prescriptions Discharge Medication List as of 12/22/2016  6:24 PM    START taking these medications   Details  oxyCODONE (ROXICODONE) 5 MG immediate release tablet Take 0.5-1 tablets (2.5-5 mg total) by mouth every 4 (four) hours as needed for severe pain or breakthrough pain., Starting Tue 12/22/2016, Print    predniSONE (DELTASONE) 20 MG tablet Take 1 tablet (20 mg total) by mouth daily., Starting Tue 12/22/2016, Until Sun 12/27/2016, Print         Shaune Pollack, MD 12/23/16 [phone]

## 2017-01-12 DIAGNOSIS — M791 Myalgia: Secondary | ICD-10-CM | POA: Diagnosis not present

## 2017-01-12 DIAGNOSIS — Z0000 Encounter for general adult medical examination without abnormal findings: Secondary | ICD-10-CM | POA: Diagnosis not present

## 2017-01-19 ENCOUNTER — Inpatient Hospital Stay (HOSPITAL_COMMUNITY)
Admission: EM | Admit: 2017-01-19 | Discharge: 2017-01-21 | DRG: 682 | Disposition: A | Discharge status: Skilled Nursing Facility | Payer: Medicare Other | Attending: Internal Medicine | Admitting: Internal Medicine

## 2017-01-19 ENCOUNTER — Encounter (HOSPITAL_COMMUNITY): Payer: Self-pay

## 2017-01-19 DIAGNOSIS — Z681 Body mass index (BMI) 19 or less, adult: Secondary | ICD-10-CM

## 2017-01-19 DIAGNOSIS — M6281 Muscle weakness (generalized): Secondary | ICD-10-CM | POA: Diagnosis not present

## 2017-01-19 DIAGNOSIS — N179 Acute kidney failure, unspecified: Secondary | ICD-10-CM | POA: Diagnosis not present

## 2017-01-19 DIAGNOSIS — I10 Essential (primary) hypertension: Secondary | ICD-10-CM | POA: Diagnosis not present

## 2017-01-19 DIAGNOSIS — Z87891 Personal history of nicotine dependence: Secondary | ICD-10-CM | POA: Diagnosis not present

## 2017-01-19 DIAGNOSIS — Z7902 Long term (current) use of antithrombotics/antiplatelets: Secondary | ICD-10-CM | POA: Diagnosis not present

## 2017-01-19 DIAGNOSIS — F329 Major depressive disorder, single episode, unspecified: Secondary | ICD-10-CM | POA: Diagnosis present

## 2017-01-19 DIAGNOSIS — R278 Other lack of coordination: Secondary | ICD-10-CM | POA: Diagnosis not present

## 2017-01-19 DIAGNOSIS — R627 Adult failure to thrive: Secondary | ICD-10-CM | POA: Diagnosis present

## 2017-01-19 DIAGNOSIS — I959 Hypotension, unspecified: Secondary | ICD-10-CM | POA: Diagnosis not present

## 2017-01-19 DIAGNOSIS — N183 Chronic kidney disease, stage 3 (moderate): Secondary | ICD-10-CM | POA: Diagnosis not present

## 2017-01-19 DIAGNOSIS — Z8673 Personal history of transient ischemic attack (TIA), and cerebral infarction without residual deficits: Secondary | ICD-10-CM | POA: Diagnosis not present

## 2017-01-19 DIAGNOSIS — Z66 Do not resuscitate: Secondary | ICD-10-CM | POA: Diagnosis not present

## 2017-01-19 DIAGNOSIS — F0390 Unspecified dementia without behavioral disturbance: Secondary | ICD-10-CM | POA: Diagnosis present

## 2017-01-19 DIAGNOSIS — G3183 Dementia with Lewy bodies: Secondary | ICD-10-CM | POA: Diagnosis not present

## 2017-01-19 DIAGNOSIS — G9340 Encephalopathy, unspecified: Secondary | ICD-10-CM | POA: Diagnosis not present

## 2017-01-19 DIAGNOSIS — F0280 Dementia in other diseases classified elsewhere without behavioral disturbance: Secondary | ICD-10-CM | POA: Diagnosis not present

## 2017-01-19 DIAGNOSIS — R001 Bradycardia, unspecified: Secondary | ICD-10-CM | POA: Diagnosis present

## 2017-01-19 DIAGNOSIS — G9341 Metabolic encephalopathy: Secondary | ICD-10-CM | POA: Diagnosis present

## 2017-01-19 DIAGNOSIS — R410 Disorientation, unspecified: Secondary | ICD-10-CM | POA: Diagnosis not present

## 2017-01-19 DIAGNOSIS — J449 Chronic obstructive pulmonary disease, unspecified: Secondary | ICD-10-CM | POA: Diagnosis present

## 2017-01-19 DIAGNOSIS — Z79899 Other long term (current) drug therapy: Secondary | ICD-10-CM

## 2017-01-19 DIAGNOSIS — I129 Hypertensive chronic kidney disease with stage 1 through stage 4 chronic kidney disease, or unspecified chronic kidney disease: Secondary | ICD-10-CM | POA: Diagnosis not present

## 2017-01-19 DIAGNOSIS — E860 Dehydration: Secondary | ICD-10-CM | POA: Diagnosis not present

## 2017-01-19 DIAGNOSIS — R531 Weakness: Secondary | ICD-10-CM | POA: Diagnosis not present

## 2017-01-19 DIAGNOSIS — R636 Underweight: Secondary | ICD-10-CM | POA: Diagnosis present

## 2017-01-19 DIAGNOSIS — R1312 Dysphagia, oropharyngeal phase: Secondary | ICD-10-CM | POA: Diagnosis not present

## 2017-01-19 DIAGNOSIS — D649 Anemia, unspecified: Secondary | ICD-10-CM | POA: Diagnosis not present

## 2017-01-19 DIAGNOSIS — R488 Other symbolic dysfunctions: Secondary | ICD-10-CM | POA: Diagnosis not present

## 2017-01-19 DIAGNOSIS — R946 Abnormal results of thyroid function studies: Secondary | ICD-10-CM | POA: Diagnosis not present

## 2017-01-19 DIAGNOSIS — R269 Unspecified abnormalities of gait and mobility: Secondary | ICD-10-CM | POA: Diagnosis not present

## 2017-01-19 LAB — CBC
HCT: 28.2 % — ABNORMAL LOW (ref 36.0–46.0)
Hemoglobin: 9.1 g/dL — ABNORMAL LOW (ref 12.0–15.0)
MCH: 27.9 pg (ref 26.0–34.0)
MCHC: 32.3 g/dL (ref 30.0–36.0)
MCV: 86.5 fL (ref 78.0–100.0)
Platelets: 368 10*3/uL (ref 150–400)
RBC: 3.26 MIL/uL — ABNORMAL LOW (ref 3.87–5.11)
RDW: 14.7 % (ref 11.5–15.5)
WBC: 4.9 10*3/uL (ref 4.0–10.5)

## 2017-01-19 LAB — URINALYSIS, ROUTINE W REFLEX MICROSCOPIC
Bilirubin Urine: NEGATIVE
Glucose, UA: NEGATIVE mg/dL
Hgb urine dipstick: NEGATIVE
Ketones, ur: NEGATIVE mg/dL
Leukocytes, UA: NEGATIVE
Nitrite: NEGATIVE
Protein, ur: NEGATIVE mg/dL
Specific Gravity, Urine: 1.01 (ref 1.005–1.030)
pH: 6 (ref 5.0–8.0)

## 2017-01-19 LAB — BASIC METABOLIC PANEL
Anion gap: 9 (ref 5–15)
BUN: 46 mg/dL — ABNORMAL HIGH (ref 6–20)
CO2: 26 mmol/L (ref 22–32)
Calcium: 11.2 mg/dL — ABNORMAL HIGH (ref 8.9–10.3)
Chloride: 100 mmol/L — ABNORMAL LOW (ref 101–111)
Creatinine, Ser: 2.33 mg/dL — ABNORMAL HIGH (ref 0.44–1.00)
GFR calc Af Amer: 21 mL/min — ABNORMAL LOW (ref >60)
GFR calc non Af Amer: 18 mL/min — ABNORMAL LOW (ref >60)
Glucose, Bld: 106 mg/dL — ABNORMAL HIGH (ref 65–99)
Potassium: 4.3 mmol/L (ref 3.5–5.1)
Sodium: 135 mmol/L (ref 135–145)

## 2017-01-19 LAB — TSH: TSH: 0.253 u[IU]/mL — ABNORMAL LOW (ref 0.350–4.500)

## 2017-01-19 MED ORDER — HEPARIN SODIUM (PORCINE) 5000 UNIT/ML IJ SOLN
5000.0000 [IU] | Freq: Three times a day (TID) | INTRAMUSCULAR | Status: DC
  Administered 2017-01-19 – 2017-01-21 (×6): 5000 [IU] via SUBCUTANEOUS
  Filled 2017-01-19 (×7): qty 1

## 2017-01-19 MED ORDER — SODIUM CHLORIDE 0.9 % IV SOLN
INTRAVENOUS | Status: DC
  Administered 2017-01-19 – 2017-01-20 (×2): via INTRAVENOUS
  Administered 2017-01-21: 1000 mL via INTRAVENOUS

## 2017-01-19 MED ORDER — SODIUM CHLORIDE 0.9 % IV BOLUS (SEPSIS)
1000.0000 mL | Freq: Once | INTRAVENOUS | Status: AC
  Administered 2017-01-19: 1000 mL via INTRAVENOUS

## 2017-01-19 MED ORDER — AMLODIPINE BESYLATE 5 MG PO TABS
5.0000 mg | ORAL_TABLET | Freq: Every day | ORAL | Status: DC
  Administered 2017-01-19 – 2017-01-20 (×2): 5 mg via ORAL
  Filled 2017-01-19 (×2): qty 1

## 2017-01-19 MED ORDER — DONEPEZIL HCL 10 MG PO TABS
10.0000 mg | ORAL_TABLET | Freq: Every day | ORAL | Status: DC
  Administered 2017-01-19 – 2017-01-20 (×2): 10 mg via ORAL
  Filled 2017-01-19 (×2): qty 1

## 2017-01-19 MED ORDER — ACETAMINOPHEN 325 MG PO TABS: 650.0000 mg | ORAL_TABLET | Freq: Four times a day (QID) | ORAL | Status: DC | PRN

## 2017-01-19 MED ORDER — BUPROPION HCL ER (SR) 100 MG PO TB12
100.0000 mg | ORAL_TABLET | Freq: Every day | ORAL | Status: DC
  Administered 2017-01-20 – 2017-01-21 (×2): 100 mg via ORAL
  Filled 2017-01-19 (×2): qty 1

## 2017-01-19 MED ORDER — OXYCODONE HCL 5 MG PO TABS
2.5000 mg | ORAL_TABLET | ORAL | Status: DC | PRN
  Administered 2017-01-19 – 2017-01-20 (×2): 2.5 mg via ORAL
  Administered 2017-01-20 – 2017-01-21 (×2): 5 mg via ORAL
  Filled 2017-01-19 (×4): qty 1

## 2017-01-19 MED ORDER — TRAZODONE HCL 50 MG PO TABS
50.0000 mg | ORAL_TABLET | Freq: Every evening | ORAL | Status: DC | PRN
  Administered 2017-01-19: 50 mg via ORAL
  Filled 2017-01-19: qty 1

## 2017-01-19 MED ORDER — SODIUM CHLORIDE 0.9% FLUSH
3.0000 mL | Freq: Two times a day (BID) | INTRAVENOUS | Status: DC
  Administered 2017-01-19 – 2017-01-20 (×2): 3 mL via INTRAVENOUS

## 2017-01-19 MED ORDER — ATORVASTATIN CALCIUM 20 MG PO TABS
20.0000 mg | ORAL_TABLET | Freq: Every day | ORAL | Status: DC
  Administered 2017-01-20 – 2017-01-21 (×2): 20 mg via ORAL
  Filled 2017-01-19 (×2): qty 1

## 2017-01-19 MED ORDER — CLOPIDOGREL BISULFATE 75 MG PO TABS
75.0000 mg | ORAL_TABLET | Freq: Every day | ORAL | Status: DC
  Administered 2017-01-20 – 2017-01-21 (×2): 75 mg via ORAL
  Filled 2017-01-19 (×2): qty 1

## 2017-01-19 NOTE — H&P (Signed)
History and Physical    Adriana Middleton:[id number] DOB: 1930/02/27 DOA: 01/19/2017  Referring MD/NP/PA: Dr. Juleen China  PCP: Geraldo Pitter, MD   Patient coming from: home, came via EMS  Chief Complaint: confusion, poor oral intake   HPI: Adriana Middleton is a 83 y.o. female with known COPD, hypertension, depression, dementia, presented to [facility] ED via EMS for evaluation of confusion, agitation. Please note that pt is rather poor historian and due to confusion and underlying dementia. Daughter at bedside able to provide some information and says mom has not been able to care for herself for a while now and she has already started to look for placement. Py has not been eating well and daughter does not know if she is taking medications correctly. No reports of fevers, no abd or urinary concerns reported.   ED Course: Pt confused, crying, VS notable for HR 48-87, otherwise stable, blood work notable for Cr 2.33. Pt started on IVF and TRH asked to admit for further evaluation. Telemetry bed requested due to bradycardia.   Review of Systems:  Pt unable to provide due to altered mental status.   Past Medical History:  Diagnosis Date  . ARTHRITIS, LEFT HIP 04/18/2008  . CATARACTS, BILATERAL 04/18/2008  . COPD 04/18/2008  . DEPRESSION 07/08/2007  . GERD 07/08/2007  . HYPERTENSION 07/08/2007  . Stroke (HCC)   . Unspecified disorder of parathyroid gland 11/14/2010    Past Surgical History:  Procedure Laterality Date  . COLOSTOMY     partial  . EP IMPLANTABLE DEVICE N/A 06/18/2015   Procedure: Loop Recorder Insertion;  Surgeon: Marinus Maw, MD;  Location: [facility] INVASIVE CV LAB;  Service: Cardiovascular;  Laterality: N/A;  . TEE WITHOUT CARDIOVERSION N/A 06/18/2015   Procedure: TRANSESOPHAGEAL ECHOCARDIOGRAM (TEE);  Surgeon: Chrystie Nose, MD;  Location: [facility] ENDOSCOPY;  Service: Cardiovascular;  Laterality: N/A;   Social Hx:  reports that she has quit smoking. She has never used smokeless tobacco. She  reports that she does not drink alcohol or use drugs.  Allergies  Allergen Reactions  . Codeine Itching  . Simvastatin Itching    Family History  Problem Relation Age of Onset  . Diabetes Other   . Hypertension Other   . Cancer Other     prostate  . Heart disease Other     Prior to Admission medications   Medication Sig Start Date End Date Taking? Authorizing Provider  acetaminophen (TYLENOL) 325 MG tablet Take 2 tablets (650 mg total) by mouth every 6 (six) hours as needed for mild pain, moderate pain, fever or headache (or Fever >/= 101). 09/23/15   Elease Etienne, MD  amLODipine (NORVASC) 5 MG tablet Take 5 mg by mouth at bedtime.  08/01/16   Historical Provider, MD  atorvastatin (LIPITOR) 20 MG tablet Take 1 tablet (20 mg total) by mouth daily. 07/19/15   Micki Riley, MD  buPROPion (WELLBUTRIN SR) 100 MG 12 hr tablet TAKE 1 TABLET (100 MG TOTAL) BY MOUTH daily 10/13/16   Micki Riley, MD  clopidogrel (PLAVIX) 75 MG tablet Take 1 tablet (75 mg total) by mouth daily. 07/19/15   Micki Riley, MD  donepezil (ARICEPT) 10 MG tablet Take 1 tablet (10 mg total) by mouth at bedtime. 08/13/16   Micki Riley, MD  ENSURE PLUS (ENSURE PLUS) LIQD Take 237 mLs by mouth daily. 06/24/15   Historical Provider, MD  oxyCODONE (ROXICODONE) 5 MG immediate release tablet Take 0.5-1 tablets (2.5-5 mg  total) by mouth every 4 (four) hours as needed for severe pain or breakthrough pain. 12/22/16   Shaune Pollack, MD  oxyCODONE-acetaminophen (PERCOCET/ROXICET) 5-325 MG tablet Take 1 tablet by mouth every 4 (four) hours as needed for severe pain. Patient not taking: Reported on 12/22/2016 03/12/16   Glynn Octave, MD  telmisartan-hydrochlorothiazide (MICARDIS HCT) 40-12.5 MG tablet  07/15/16   Historical Provider, MD    Physical Exam: Vitals:   01/19/17 1700 01/19/17 1730 01/19/17 1800 01/19/17 1840  BP: 119/91 123/67 121/93 (!) 143/53  Pulse: 69 87 71 69  Resp: 18 16  18   Temp:    97.8 F (36.6 C)    TempSrc:    Oral  SpO2: 100% 99% 97% 100%    Constitutional: confused, crying, follows some commands  Vitals:   01/19/17 1700 01/19/17 1730 01/19/17 1800 01/19/17 1840  BP: 119/91 123/67 121/93 (!) 143/53  Pulse: 69 87 71 69  Resp: 18 16  18   Temp:    97.8 F (36.6 C)  TempSrc:    Oral  SpO2: 100% 99% 97% 100%   Eyes: PERRL, lids and conjunctivae normal ENMT: Mucous membranes are dry. Posterior pharynx clear of any exudate or lesions. Neck: normal, supple, no masses, no thyromegaly Respiratory: clear to auscultation bilaterally, no wheezing, no crackles. Diminished breath sounds at bases   Cardiovascular: Regular rate and rhythm, no murmurs / rubs / gallops Abdomen: no tenderness, no masses palpated. No hepatosplenomegaly. Bowel sounds positive.  Musculoskeletal: no clubbing / cyanosis. No joint deformity upper and lower extremities.  Skin: no rashes, lesions, ulcers. No induration Neurologic: CN 2-12 grossly intact. Sensation intact, DTR normal. Moving all 4 extremities spontaneously  Psychiatric: difficult to assess due to confusion   Labs on Admission: I have personally reviewed following labs and imaging studies  CBC:  Recent Labs Lab 01/19/17 1235  WBC 4.9  HGB 9.1*  HCT 28.2*  MCV 86.5  PLT 368   Basic Metabolic Panel:  Recent Labs Lab 01/19/17 1235  NA 135  K 4.3  CL 100*  CO2 26  GLUCOSE 106*  BUN 46*  CREATININE 2.33*  CALCIUM 11.2*   Urine analysis:    Component Value Date/Time   COLORURINE STRAW (A) 01/19/2017 1420   APPEARANCEUR CLEAR 01/19/2017 1420   LABSPEC 1.010 01/19/2017 1420   PHURINE 6.0 01/19/2017 1420   GLUCOSEU NEGATIVE 01/19/2017 1420   HGBUR NEGATIVE 01/19/2017 1420   HGBUR trace-lysed 11/03/2010 0959   BILIRUBINUR NEGATIVE 01/19/2017 1420   BILIRUBINUR n 12/06/2012 1131   KETONESUR NEGATIVE 01/19/2017 1420   PROTEINUR NEGATIVE 01/19/2017 1420   UROBILINOGEN 1.0 09/22/2015 0838   NITRITE NEGATIVE 01/19/2017 1420    LEUKOCYTESUR NEGATIVE 01/19/2017 1420   Radiological Exams on Admission: No results found.  EKG: pending   Assessment/Plan Active Problems: Acute renal injury imposed on CKD stage III - with baseline Cr 1.2 - 1.6 - now Cr up from poor oral intake, use of Micardis - will stop Micardis for now - place on IVF - encourage oral intake  - BMP in AM  Hypercalcemia - from dehydration - I suspect his will improve with hydration - BMP in AM  Acute metabolic encephalopathy - imposed on Hx of known dementia and depression  - secondary to the above - hopefully with improve with hydration   Bradycardia - check TSH - also consider stopping Donepezil if bradycardia does not resolve - EKD pending - keep on tele for now   HTN - resume Norvasc  COPD -  respiratory status stable at this time  DVT prophylaxis: Heparin SQ Code Status: DNR Family Communication: Daughter updated at bedside Disposition Plan: to be determined  Consults called: None Admission status: Inpatient   Debbora PrestoMAGICK-Denys Labree MD [facility] Pager [phone]- [phone]  If 7PM-7AM, please contact night-coverage [email or web address] Password [email or web address]  01/19/2017, 6:47 PM

## 2017-01-19 NOTE — Progress Notes (Signed)
Adriana Middleton [id number] Admission Data: 01/19/2017 7:51 PM Attending Provider: Dorothea OgleIskra M Myers, MD  WUJ:WJXBJ,YNWGNPCP:BLAND,VEITA J, MD Consults/ Treatment Team:   Adriana Middleton is a 83 y.o. female patient admitted from ED awake, alert  & orientated  X 3,  Full Code, VSS - Blood pressure (!) 143/53, pulse 69, temperature 97.8 F (36.6 C), temperature source Oral, resp. rate 18, SpO2 100 %.,  no c/o shortness of breath, no c/o chest pain, no distress noted. Tele # 24 placed and pt is currently running:normal sinus rhythm.   IV site WDL:  forearm left, condition patent and no redness with a transparent dsg that's clean dry and intact.  Allergies:   Allergies  Allergen Reactions  . Codeine Itching  . Simvastatin Itching     Past Medical History:  Diagnosis Date  . ARTHRITIS, LEFT HIP 04/18/2008  . CATARACTS, BILATERAL 04/18/2008  . COPD 04/18/2008  . DEPRESSION 07/08/2007  . GERD 07/08/2007  . HYPERTENSION 07/08/2007  . Stroke (HCC)   . 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 Tobacco/alcohol: denied none  Pt orientation to unit, room and routine. Information packet given to patient/family and safety video watched.  Admission INP armband ID verified with patient/family, and in place. SR up x 2, fall risk assessment complete with Patient and family verbalizing understanding of risks associated with falls. Pt verbalizes an understanding of how to use the call bell and to call for help before getting out of bed.  Skin, clean-dry- intact without evidence of bruising, or skin tears.   No evidence of skin break down noted on exam. color normal, vascularity normal, no rashes or suspicious lesions, no evidence of bleeding or bruising, no lesions noted, no rash, no edema, temperature normal, texture normal, mobility and turgor normal, nails normal without clubbing    Will cont to monitor and assist as needed.  Camillo FlamingVicki L Latacha Texeira, RN 01/19/2017 7:51 PM

## 2017-01-19 NOTE — ED Triage Notes (Signed)
PER EMS: pt from home, reports she felt weak this morning and was unable to get herself out of bed. She states she has had lack of appetite for the past few weeks associated with trouble sleeping. Hx of Dementia, pt at baseline mentation per EMS. Pt does have right arm drift and weakness but is residual deficit from a stroke in 2016. VS: initial BP was 90/60, HR-64 and 87% RA.

## 2017-01-19 NOTE — ED Notes (Signed)
Pt has been very emotional.

## 2017-01-19 NOTE — ED Provider Notes (Signed)
[facility]-EMERGENCY DEPT Provider Note   CSN: [id number] Arrival date & time: 01/19/17  1157  By signing my name below, I, Sonum Patel, attest that this documentation has been prepared under the direction and in the presence of Raeford Razor, MD. Electronically Signed: Sonum Patel, Neurosurgeon. 01/19/17. 12:59 PM.  History   Chief Complaint Chief Complaint  Patient presents with  . Weakness    The history is provided by the patient. No language interpreter was used.    Level 5 Caveat: Emotionally Upset and Uncooperative HPI Comments: Adriana Middleton is a 83 y.o. female brought in by ambulance, who presents to the Emergency Department complaining today of generalized weakness and no appetite. She states she called EMS for help but does not state what she would like assistance with or what concerns she has at this time. She is intermittently crying at this time.   Past Medical History:  Diagnosis Date  . ARTHRITIS, LEFT HIP 04/18/2008  . CATARACTS, BILATERAL 04/18/2008  . COPD 04/18/2008  . DEPRESSION 07/08/2007  . GERD 07/08/2007  . HYPERTENSION 07/08/2007  . Stroke (HCC)   . 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 Renal failure (ARF), acute on chronic (HCC)   . Troponin level elevated 09/22/2015  . Acute renal failure (ARF) (HCC) 09/22/2015  . Hypercalcemia 09/22/2015  . Acute encephalopathy 09/22/2015  . DNR (do not resuscitate) 09/22/2015  . Palliative care status 09/22/2015  . Hypokalemia 09/22/2015  . Loss of weight 09/22/2015  . Embolic stroke (HCC) 07/19/2015  . Aortic atherosclerosis (HCC)   . Carotid stenosis   . Stroke (HCC)   . HLD (hyperlipidemia)   . Left-sided weakness 06/16/2015  . Acute right arterial ischemic stroke, MCA (middle cerebral artery) (HCC) 06/16/2015  . Facial weakness 06/10/2015  . TIA (transient ischemic attack) 06/10/2015  . UNSPECIFIED DISORDER OF PARATHYROID GLAND 11/14/2010  .  CATARACTS, BILATERAL 04/18/2008  . COPD 04/18/2008  . ARTHRITIS, LEFT HIP 04/18/2008  . DEPRESSION 07/08/2007  . Essential hypertension 07/08/2007  . GERD 07/08/2007    Past Surgical History:  Procedure Laterality Date  . COLOSTOMY     partial  . EP IMPLANTABLE DEVICE N/A 06/18/2015   Procedure: Loop Recorder Insertion;  Surgeon: Marinus Maw, MD;  Location: [facility] INVASIVE CV LAB;  Service: Cardiovascular;  Laterality: N/A;  . TEE WITHOUT CARDIOVERSION N/A 06/18/2015   Procedure: TRANSESOPHAGEAL ECHOCARDIOGRAM (TEE);  Surgeon: Chrystie Nose, MD;  Location: [facility] ENDOSCOPY;  Service: Cardiovascular;  Laterality: N/A;    OB History    No data available       Home Medications    Prior to Admission medications   Medication Sig Start Date End Date Taking? Authorizing Provider  acetaminophen (TYLENOL) 325 MG tablet Take 2 tablets (650 mg total) by mouth every 6 (six) hours as needed for mild pain, moderate pain, fever or headache (or Fever >/= 101). 09/23/15   Elease Etienne, MD  amLODipine (NORVASC) 5 MG tablet Take 5 mg by mouth at bedtime.  08/01/16   Historical Provider, MD  atorvastatin (LIPITOR) 20 MG tablet Take 1 tablet (20 mg total) by mouth daily. 07/19/15   Micki Riley, MD  buPROPion (WELLBUTRIN SR) 100 MG 12 hr tablet TAKE 1 TABLET (100 MG TOTAL) BY MOUTH daily 10/13/16   Micki Riley, MD  clopidogrel (PLAVIX) 75 MG tablet Take 1 tablet (75 mg total) by mouth daily. 07/19/15   Pramod  Marlis Edelson, MD  donepezil (ARICEPT) 10 MG tablet Take 1 tablet (10 mg total) by mouth at bedtime. 08/13/16   Micki Riley, MD  ENSURE PLUS (ENSURE PLUS) LIQD Take 237 mLs by mouth daily. 06/24/15   Historical Provider, MD  oxyCODONE (ROXICODONE) 5 MG immediate release tablet Take 0.5-1 tablets (2.5-5 mg total) by mouth every 4 (four) hours as needed for severe pain or breakthrough pain. 12/22/16   Shaune Pollack, MD  oxyCODONE-acetaminophen (PERCOCET/ROXICET) 5-325 MG tablet Take 1 tablet by mouth  every 4 (four) hours as needed for severe pain. Patient not taking: Reported on 12/22/2016 03/12/16   Glynn Octave, MD  telmisartan-hydrochlorothiazide (MICARDIS HCT) 40-12.5 MG tablet  07/15/16   Historical Provider, MD    Family History Family History  Problem Relation Age of Onset  . Diabetes Other   . Hypertension Other   . Cancer Other     prostate  . Heart disease Other     Social History Social History  Substance Use Topics  . Smoking status: Former Games developer  . Smokeless tobacco: Never Used     Comment: quit 15 yrs ago.    . Alcohol use No     Allergies   Codeine and Simvastatin   Review of Systems Review of Systems  Unable to perform ROS: Other  Emotionally upset and generally uncooperative    Physical Exam Updated Vital Signs BP 112/55 (BP Location: Right Arm)   Pulse 65   Temp 98.5 F (36.9 C) (Oral)   Resp 16   SpO2 100%   Physical Exam  Constitutional: She is oriented to person, place, and time. She appears well-developed and well-nourished. No distress.  Emotionally upset. Crying at times. Generally uncooperative.  HENT:  Head: Normocephalic and atraumatic.  Eyes: EOM are normal.  Neck: Normal range of motion.  Cardiovascular: Normal rate, regular rhythm and normal heart sounds.   Pulmonary/Chest: Effort normal and breath sounds normal.  Abdominal: Soft. She exhibits no distension. There is no tenderness.  Musculoskeletal: Normal range of motion. She exhibits no deformity.  Neurological: She is alert and oriented to person, place, and time. No sensory deficit.  Used all 4 extremities. No obvious deficits   Skin: Skin is warm and dry.  Psychiatric: She has a normal mood and affect. Judgment normal.  Nursing note and vitals reviewed.    ED Treatments / Results  DIAGNOSTIC STUDIES: Oxygen Saturation is 100% on RA, normal by my interpretation.    COORDINATION OF CARE: 12:52 PM Discussed treatment plan with pt at bedside and pt agreed to  plan.   Labs (all labs ordered are listed, but only abnormal results are displayed) Labs Reviewed  BASIC METABOLIC PANEL - Abnormal; Notable for the following:       Result Value   Chloride 100 (*)    Glucose, Bld 106 (*)    BUN 46 (*)    Creatinine, Ser 2.33 (*)    Calcium 11.2 (*)    GFR calc non Af Amer 18 (*)    GFR calc Af Amer 21 (*)    All other components within normal limits  CBC - Abnormal; Notable for the following:    RBC 3.26 (*)    Hemoglobin 9.1 (*)    HCT 28.2 (*)    All other components within normal limits  URINALYSIS, ROUTINE W REFLEX MICROSCOPIC - Abnormal; Notable for the following:    Color, Urine STRAW (*)    All other components within normal limits  CBG  MONITORING, ED    EKG  EKG Interpretation None       Radiology No results found.  Procedures Procedures (including critical care time)  Medications Ordered in ED Medications - No data to display   Initial Impression / Assessment and Plan / ED Course  I have reviewed the triage vital signs and the nursing notes.  Pertinent labs & imaging results that were available during my care of the patient were reviewed by me and considered in my medical decision making (see chart for details).     86yF with essentially failure to thrive. Not eating/drinking. Labs consistent with dehydration. Daughter is fairly knowledgeable and has already in process of getting patient placed. She has declined to the point where she just physically cannot manage her at home by herself. Will discuss with medicine for obs for dehydration.   Final Clinical Impressions(s) / ED Diagnoses   Final diagnoses:  Failure to thrive in adult    New Prescriptions New Prescriptions   No medications on file   I personally preformed the services scribed in my presence. The recorded information has been reviewed is accurate. Raeford RazorStephen Nechama Escutia, MD.    Raeford RazorStephen Sameeha Rockefeller, MD 01/24/17 2113

## 2017-01-20 DIAGNOSIS — N179 Acute kidney failure, unspecified: Principal | ICD-10-CM

## 2017-01-20 DIAGNOSIS — E860 Dehydration: Secondary | ICD-10-CM

## 2017-01-20 DIAGNOSIS — I10 Essential (primary) hypertension: Secondary | ICD-10-CM

## 2017-01-20 LAB — BASIC METABOLIC PANEL
Anion gap: 14 (ref 5–15)
BUN: 33 mg/dL — AB (ref 6–20)
CHLORIDE: 101 mmol/L (ref 101–111)
CO2: 22 mmol/L (ref 22–32)
CREATININE: 1.64 mg/dL — AB (ref 0.44–1.00)
Calcium: 10.9 mg/dL — ABNORMAL HIGH (ref 8.9–10.3)
GFR calc Af Amer: 32 mL/min — ABNORMAL LOW (ref >60)
GFR calc non Af Amer: 28 mL/min — ABNORMAL LOW (ref >60)
GLUCOSE: 74 mg/dL (ref 65–99)
POTASSIUM: 4.5 mmol/L (ref 3.5–5.1)
SODIUM: 137 mmol/L (ref 135–145)

## 2017-01-20 LAB — CBC
HEMATOCRIT: 30.4 % — AB (ref 36.0–46.0)
Hemoglobin: 9.6 g/dL — ABNORMAL LOW (ref 12.0–15.0)
MCH: 27.6 pg (ref 26.0–34.0)
MCHC: 31.6 g/dL (ref 30.0–36.0)
MCV: 87.4 fL (ref 78.0–100.0)
PLATELETS: 366 10*3/uL (ref 150–400)
RBC: 3.48 MIL/uL — ABNORMAL LOW (ref 3.87–5.11)
RDW: 14.7 % (ref 11.5–15.5)
WBC: 5.4 10*3/uL (ref 4.0–10.5)

## 2017-01-20 LAB — T4, FREE: Free T4: 1.39 ng/dL — ABNORMAL HIGH (ref 0.61–1.12)

## 2017-01-20 MED ORDER — ENSURE ENLIVE PO LIQD
237.0000 mL | ORAL | Status: DC
  Administered 2017-01-21: 237 mL via ORAL

## 2017-01-20 MED ORDER — BOOST / RESOURCE BREEZE PO LIQD: 1.0000 | Freq: Three times a day (TID) | ORAL | Status: DC

## 2017-01-20 MED ORDER — PRO-STAT SUGAR FREE PO LIQD
30.0000 mL | Freq: Every day | ORAL | Status: DC
  Administered 2017-01-21: 30 mL via ORAL
  Filled 2017-01-20: qty 30

## 2017-01-20 NOTE — Progress Notes (Signed)
Initial Nutrition Assessment  DOCUMENTATION CODES:   Underweight  INTERVENTION:   -Ensure Enlive po daily, each supplement provides 350 kcal and 20 grams of protein -Boost Breeze po daily, each supplement provides 250 kcal and 9 grams of protein -30 ml Prostat daily, each supplement provides 100 kcals and 15 grams protein   NUTRITION DIAGNOSIS:   Inadequate oral intake related to poor appetite as evidenced by meal completion < 25%, per patient/family report.  GOAL:   Patient will meet greater than or equal to 90% of their needs  MONITOR:   PO intake, Supplement acceptance, Labs, Weight trends, Skin, I & O's  REASON FOR ASSESSMENT:   Consult Poor PO  ASSESSMENT:   Adriana Middleton is a 83 y.o. female with known COPD, hypertension, depression, dementia, [facility] for evaluation of confusion, agitation. Please note that pt is rather poor historian and due to confusion and underlying dementia. Daughter at bedside able to provide some information and says mom has not been able to care for herself for a while now and she has already started to look for placement. Py has not been eating well and daughter does not know if she is taking medications correctly. No reports of fevers, no abd or urinary concerns reported.   Pt admitted with AKI.   Spoke with pt daughter at bedside, who reports that pt's oral intake has declined progressively over the past 3-4 weeks. She shares that she goes through periods of eating poorly since her stroke several years ago, but always "bounces back after awhile". Pt daughter reports emotional exhaustion over pt's decline and repeated food refusals. Pt will drink sweet juices, such as pineapple juice. Per discussion with RN, pt was able to eat a few bites of lunch with a lot of encouragement.   When trying to interact with pt, pt stated to this RD "food makes you fat" and "you don't need to eat when you are just sitting around". RD discussed  importance of oral intake to provide energy to perform basic tasks and biochemical functions, even when immobile.   Pt daughter reports that she has tried supplements and pt tends to accept for a short period of time, but "then will only take a few sips". Pt daughter agreeable to a variety of supplements to improve acceptance.   Nutrition-Focused physical exam completed. Findings are mild fat depletion, mild muscle depletion, and no  edema.   Per daughter, pt plans to d/c to SNF.   Labs reviewed.   Diet Order:  Diet regular Room service appropriate? Yes; Fluid consistency: Thin  Skin:  Reviewed, no issues  Last BM:  01/18/17  Height:   Ht Readings from Last 1 Encounters:  08/13/16 5\' 2"  (1.575 m)    Weight:   Wt Readings from Last 1 Encounters:  01/20/17 99 lb 14.4 oz (45.3 kg)    Ideal Body Weight:  50 kg  BMI:  Body mass index is 18.27 kg/m.  Estimated Nutritional Needs:   Kcal:  1100-1300  Protein:  45-55 grams  Fluid:  1.1-1.3 L  EDUCATION NEEDS:   Education needs addressed  [name], RD, LDN, CDE Pager: [phone] After hours Pager: [phone]

## 2017-01-20 NOTE — Evaluation (Signed)
Physical Therapy Evaluation Patient Details Name: Adriana Middleton MRN: [id number] DOB: 09-21-1930 Today's Date: 01/20/2017   History of Present Illness   Adriana Middleton is a 83 y.o. female with known COPD, hypertension, depression, dementia, [facility] for evaluation of confusion, agitation.  Pt found to have AKI and hypercalcemia  Clinical Impression  Pt admitted with/for confusion likely due to dehydration.  Pt is at a min/mod level for mobility and daughter feels it is time to transition to the nursing home..  Pt currently limited functionally due to the problems listed. ( See problems list.)   Pt will benefit from PT to maximize function and safety in order to get ready for next venue listed below.     Follow Up Recommendations SNF    Equipment Recommendations  None recommended by PT    Recommendations for Other Services       Precautions / Restrictions Precautions Precautions: Fall      Mobility  Bed Mobility Overal bed mobility: Needs Assistance Bed Mobility: Supine to Sit;Sit to Supine     Supine to sit: Min assist Sit to supine: Min guard   General bed mobility comments: significant time to get OOB, needing assist to get legs to EOB  Transfers Overall transfer level: Needs assistance   Transfers: Sit to/from Stand;Stand Pivot Transfers Sit to Stand: Min assist;Mod assist Stand pivot transfers: Min assist       General transfer comment: pt needs less assist if she is allowed to move at her slow pace.  Ambulation/Gait             General Gait Details: transfers only  Stairs            Wheelchair Mobility    Modified Rankin (Stroke Patients Only)       Balance Overall balance assessment: Needs assistance Sitting-balance support: No upper extremity supported Sitting balance-Leahy Scale: Fair     Standing balance support: Bilateral upper extremity supported Standing balance-Leahy Scale: Poor Standing balance comment: requires  external support or AD                             Pertinent Vitals/Pain Pain Assessment: Faces Faces Pain Scale: Hurts a little bit Pain Location: generalized, L hip Pain Descriptors / Indicators: Aching;Sore Pain Intervention(s): Monitored during session    Home Living Family/patient expects to be discharged to:: Skilled nursing facility                 Additional Comments: pt has been having to have progressively more assist for mobility.  Generally transfers only and uses w/c to negotiate distances.    Prior Function Level of Independence: Needs assistance               Hand Dominance        Extremity/Trunk Assessment   Upper Extremity Assessment Upper Extremity Assessment: Generalized weakness    Lower Extremity Assessment Lower Extremity Assessment: Generalized weakness    Cervical / Trunk Assessment Cervical / Trunk Assessment: Kyphotic  Communication   Communication: No difficulties  Cognition Arousal/Alertness: Lethargic;Awake/alert Behavior During Therapy: Flat affect Overall Cognitive Status: Within Functional Limits for tasks assessed                      General Comments      Exercises     Assessment/Plan    PT Assessment Patient needs continued PT services  PT  Problem List Decreased strength;Decreased activity tolerance;Decreased balance;Decreased mobility;Decreased knowledge of use of DME;Pain       PT Treatment Interventions Gait training;Functional mobility training;Therapeutic activities;Balance training;Patient/family education;DME instruction    PT Goals (Current goals can be found in the Care Plan section)  Acute Rehab PT Goals Patient Stated Goal: pt's daughter states she would love her mom to get therapy at SNF, but move into residence. PT Goal Formulation: With patient/family Time For Goal Achievement: 02/03/17 Potential to Achieve Goals: Fair    Frequency Min 2X/week   Barriers to discharge         Co-evaluation               End of Session   Activity Tolerance: Patient tolerated treatment well;Patient limited by pain;Patient limited by fatigue Patient left: in bed;with call bell/phone within reach;with bed alarm set;with family/visitor present Nurse Communication: Mobility status PT Visit Diagnosis: Unsteadiness on feet (R26.81);Muscle weakness (generalized) (M62.81);Adult, failure to thrive (R62.7)         Time: 1610-9604 PT Time Calculation (min) (ACUTE ONLY): 29 min   Charges:   PT Evaluation $PT Eval Moderate Complexity: 1 Procedure PT Treatments $Therapeutic Activity: 8-22 mins   PT G CodesEliseo Gum Yocelin Vanlue 01/20/2017, 4:26 PM 01/20/2017  Coppell Bing, PT [phone]  (pager)

## 2017-01-20 NOTE — Progress Notes (Signed)
PROGRESS NOTE    Adriana Middleton  ZOX:[id number] DOB: 1930-09-21 DOA: 01/19/2017 PCP: Geraldo Pitter, MD   Chief Complaint  Patient presents with  . Weakness    Brief Narrative:  HPI on 01/19/2017 by Dr. Danie Binder Adriana Middleton is a 83 y.o. female with known COPD, hypertension, depression, dementia, presented to [facility] ED via EMS for evaluation of confusion, agitation. Please note that pt is rather poor historian and due to confusion and underlying dementia. Daughter at bedside able to provide some information and says mom has not been able to care for herself for a while now and she has already started to look for placement. Py has not been eating well and daughter does not know if she is taking medications correctly. No reports of fevers, no abd or urinary concerns reported.  Assessment & Plan   Acute renal injury imposed on CKD stage III -Creatinine upon admission was 2.33 -Currently 1.64 (close to baselinewith baseline Cr 1.2 - 1.6) -Likley secondary to poor oral intake, Micardis -Continue IVF and monitor BMP  Hypercalcemia -likely secondary to dehydration -On admission, calcium 11.2, currently 10.9 -Continue IVF and monitor BMP  Acute metabolic encephalopathy -imposed on history of known dementia and depression  -secondary to the above -UA unremarkable for infection. Lungs clear on exam -Suspect will improve with hydration  Bradycardia -TSH 0.253 -Free T4 pending -also consider stopping Donepezil if bradycardia does not resolve -no longer bradycardic  HTN -Continue norvasc  COPD -Respiratory status stable at this time, no wheezing on exam  Dementia -Continue aricept  Depression  -Continue wellbutrin  DVT Prophylaxis  Heparin  Code Status: ?DNR (noted on H&P as well as history), Currently Full  Family Communication: None at bedside.   Disposition Plan: Admitted, continue IVF. Nutrition and PT consulted  Consultants None  Procedures  None  Antibiotics     Anti-infectives    None      Subjective:   Adriana Middleton seen and examined today.  Patient does not know why she is in the hospital.  States her daughter brought her here and will be in trouble when she sees her. Currently has no complaints. Denies chest pain, shortness of breath, abdominal pain.   Objective:   Vitals:   01/19/17 1800 01/19/17 1840 01/19/17 2138 01/20/17 0616  BP: 121/93 (!) 143/53 (!) 123/44 (!) 145/62  Pulse: 71 69 76 100  Resp:  18 18 18   Temp:  97.8 F (36.6 C) 97.8 F (36.6 C) 97.8 F (36.6 C)  TempSrc:  Oral Oral   SpO2: 97% 100% 100% 100%  Weight:    45.3 kg (99 lb 14.4 oz)    Intake/Output Summary (Last 24 hours) at 01/20/17 1158 Last data filed at 01/20/17 0537  Gross per 24 hour  Intake           1771.5 ml  Output                0 ml  Net           1771.5 ml   Filed Weights   01/20/17 0616  Weight: 45.3 kg (99 lb 14.4 oz)    Exam  General: Well developed, thin, elderly, NAD  HEENT: NCAT,  mucous membranes moist.   Cardiovascular: S1 S2 auscultated, RRR, no murmurs  Respiratory: Clear to auscultation bilaterally with equal chest rise  Abdomen: Soft, nontender, nondistended, + bowel sounds  Extremities: warm dry without cyanosis clubbing or edema  Neuro: AAOx1 (self), has dementia, nonfocal  Psych: Normal affect and demeanor   Data Reviewed: I have personally reviewed following labs and imaging studies  CBC:  Recent Labs Lab 01/19/17 1235 01/20/17 0741  WBC 4.9 5.4  HGB 9.1* 9.6*  HCT 28.2* 30.4*  MCV 86.5 87.4  PLT 368 366   Basic Metabolic Panel:  Recent Labs Lab 01/19/17 1235 01/20/17 0741  NA 135 137  K 4.3 4.5  CL 100* 101  CO2 26 22  GLUCOSE 106* 74  BUN 46* 33*  CREATININE 2.33* 1.64*  CALCIUM 11.2* 10.9*   GFR: Estimated Creatinine Clearance: 18.3 mL/min (by C-G formula based on SCr of 1.64 mg/dL (H)). Liver Function Tests: No results for input(s): AST, ALT, ALKPHOS, BILITOT, PROT, ALBUMIN in the  last 168 hours. No results for input(s): LIPASE, AMYLASE in the last 168 hours. No results for input(s): AMMONIA in the last 168 hours. Coagulation Profile: No results for input(s): INR, PROTIME in the last 168 hours. Cardiac Enzymes: No results for input(s): CKTOTAL, CKMB, CKMBINDEX, TROPONINI in the last 168 hours. BNP (last 3 results) No results for input(s): PROBNP in the last 8760 hours. HbA1C: No results for input(s): HGBA1C in the last 72 hours. CBG: No results for input(s): GLUCAP in the last 168 hours. Lipid Profile: No results for input(s): CHOL, HDL, LDLCALC, TRIG, CHOLHDL, LDLDIRECT in the last 72 hours. Thyroid Function Tests:  Recent Labs  01/19/17 1956  TSH 0.253*   Anemia Panel: No results for input(s): VITAMINB12, FOLATE, FERRITIN, TIBC, IRON, RETICCTPCT in the last 72 hours. Urine analysis:    Component Value Date/Time   COLORURINE STRAW (A) 01/19/2017 1420   APPEARANCEUR CLEAR 01/19/2017 1420   LABSPEC 1.010 01/19/2017 1420   PHURINE 6.0 01/19/2017 1420   GLUCOSEU NEGATIVE 01/19/2017 1420   HGBUR NEGATIVE 01/19/2017 1420   HGBUR trace-lysed 11/03/2010 0959   BILIRUBINUR NEGATIVE 01/19/2017 1420   BILIRUBINUR n 12/06/2012 1131   KETONESUR NEGATIVE 01/19/2017 1420   PROTEINUR NEGATIVE 01/19/2017 1420   UROBILINOGEN 1.0 09/22/2015 0838   NITRITE NEGATIVE 01/19/2017 1420   LEUKOCYTESUR NEGATIVE 01/19/2017 1420   Sepsis Labs: @LABRCNTIP (procalcitonin:4,lacticidven:4)  )No results found for this or any previous visit (from the past 240 hour(s)).    Radiology Studies: No results found.   Scheduled Meds: . amLODipine  5 mg Oral QHS  . atorvastatin  20 mg Oral Daily  . buPROPion  100 mg Oral Daily  . clopidogrel  75 mg Oral Daily  . donepezil  10 mg Oral QHS  . heparin  5,000 Units Subcutaneous Q8H  . sodium chloride flush  3 mL Intravenous Q12H   Continuous Infusions: . sodium chloride 75 mL/hr at 01/20/17 0649     LOS: 1 day   Time Spent  in minutes   30 minutes  Zachary Nole D.O. on 01/20/2017 at 11:58 AM  Between 7am to 7pm - Pager - [phone]  After 7pm go to [email or web address] - password [email or web address]  And look for the night coverage person covering for me after hours  [facility] Office  [phone]

## 2017-01-21 DIAGNOSIS — R946 Abnormal results of thyroid function studies: Secondary | ICD-10-CM

## 2017-01-21 DIAGNOSIS — N183 Chronic kidney disease, stage 3 (moderate): Secondary | ICD-10-CM | POA: Diagnosis not present

## 2017-01-21 DIAGNOSIS — R001 Bradycardia, unspecified: Secondary | ICD-10-CM | POA: Diagnosis not present

## 2017-01-21 DIAGNOSIS — R740 Nonspecific elevation of levels of transaminase and lactic acid dehydrogenase [LDH]: Secondary | ICD-10-CM | POA: Diagnosis not present

## 2017-01-21 DIAGNOSIS — R748 Abnormal levels of other serum enzymes: Secondary | ICD-10-CM | POA: Diagnosis not present

## 2017-01-21 DIAGNOSIS — M1612 Unilateral primary osteoarthritis, left hip: Secondary | ICD-10-CM | POA: Diagnosis not present

## 2017-01-21 DIAGNOSIS — R5381 Other malaise: Secondary | ICD-10-CM | POA: Diagnosis not present

## 2017-01-21 DIAGNOSIS — D649 Anemia, unspecified: Secondary | ICD-10-CM

## 2017-01-21 DIAGNOSIS — R278 Other lack of coordination: Secondary | ICD-10-CM | POA: Diagnosis not present

## 2017-01-21 DIAGNOSIS — Z79899 Other long term (current) drug therapy: Secondary | ICD-10-CM | POA: Diagnosis not present

## 2017-01-21 DIAGNOSIS — I10 Essential (primary) hypertension: Secondary | ICD-10-CM | POA: Diagnosis not present

## 2017-01-21 DIAGNOSIS — R410 Disorientation, unspecified: Secondary | ICD-10-CM | POA: Diagnosis not present

## 2017-01-21 DIAGNOSIS — Z8673 Personal history of transient ischemic attack (TIA), and cerebral infarction without residual deficits: Secondary | ICD-10-CM | POA: Diagnosis not present

## 2017-01-21 DIAGNOSIS — G9340 Encephalopathy, unspecified: Secondary | ICD-10-CM

## 2017-01-21 DIAGNOSIS — J449 Chronic obstructive pulmonary disease, unspecified: Secondary | ICD-10-CM | POA: Diagnosis not present

## 2017-01-21 DIAGNOSIS — Z87891 Personal history of nicotine dependence: Secondary | ICD-10-CM | POA: Diagnosis not present

## 2017-01-21 DIAGNOSIS — I129 Hypertensive chronic kidney disease with stage 1 through stage 4 chronic kidney disease, or unspecified chronic kidney disease: Secondary | ICD-10-CM | POA: Diagnosis not present

## 2017-01-21 DIAGNOSIS — E8809 Other disorders of plasma-protein metabolism, not elsewhere classified: Secondary | ICD-10-CM | POA: Diagnosis not present

## 2017-01-21 DIAGNOSIS — E440 Moderate protein-calorie malnutrition: Secondary | ICD-10-CM | POA: Diagnosis not present

## 2017-01-21 DIAGNOSIS — L8990 Pressure ulcer of unspecified site, unspecified stage: Secondary | ICD-10-CM | POA: Diagnosis not present

## 2017-01-21 DIAGNOSIS — R269 Unspecified abnormalities of gait and mobility: Secondary | ICD-10-CM | POA: Diagnosis not present

## 2017-01-21 DIAGNOSIS — N179 Acute kidney failure, unspecified: Secondary | ICD-10-CM | POA: Diagnosis not present

## 2017-01-21 DIAGNOSIS — M6281 Muscle weakness (generalized): Secondary | ICD-10-CM | POA: Diagnosis not present

## 2017-01-21 DIAGNOSIS — R1312 Dysphagia, oropharyngeal phase: Secondary | ICD-10-CM | POA: Diagnosis not present

## 2017-01-21 DIAGNOSIS — D638 Anemia in other chronic diseases classified elsewhere: Secondary | ICD-10-CM | POA: Diagnosis not present

## 2017-01-21 DIAGNOSIS — F0280 Dementia in other diseases classified elsewhere without behavioral disturbance: Secondary | ICD-10-CM

## 2017-01-21 DIAGNOSIS — R488 Other symbolic dysfunctions: Secondary | ICD-10-CM | POA: Diagnosis not present

## 2017-01-21 DIAGNOSIS — D509 Iron deficiency anemia, unspecified: Secondary | ICD-10-CM | POA: Diagnosis not present

## 2017-01-21 DIAGNOSIS — R636 Underweight: Secondary | ICD-10-CM | POA: Diagnosis not present

## 2017-01-21 DIAGNOSIS — F0390 Unspecified dementia without behavioral disturbance: Secondary | ICD-10-CM | POA: Diagnosis not present

## 2017-01-21 DIAGNOSIS — E860 Dehydration: Secondary | ICD-10-CM | POA: Diagnosis not present

## 2017-01-21 DIAGNOSIS — R531 Weakness: Secondary | ICD-10-CM | POA: Diagnosis not present

## 2017-01-21 DIAGNOSIS — J441 Chronic obstructive pulmonary disease with (acute) exacerbation: Secondary | ICD-10-CM | POA: Diagnosis not present

## 2017-01-21 DIAGNOSIS — R399 Unspecified symptoms and signs involving the genitourinary system: Secondary | ICD-10-CM | POA: Diagnosis not present

## 2017-01-21 DIAGNOSIS — G3183 Dementia with Lewy bodies: Secondary | ICD-10-CM

## 2017-01-21 LAB — BASIC METABOLIC PANEL
Anion gap: 10 (ref 5–15)
BUN: 20 mg/dL (ref 6–20)
CHLORIDE: 105 mmol/L (ref 101–111)
CO2: 23 mmol/L (ref 22–32)
Calcium: 10.9 mg/dL — ABNORMAL HIGH (ref 8.9–10.3)
Creatinine, Ser: 1.35 mg/dL — ABNORMAL HIGH (ref 0.44–1.00)
GFR calc Af Amer: 41 mL/min — ABNORMAL LOW (ref >60)
GFR calc non Af Amer: 35 mL/min — ABNORMAL LOW (ref >60)
Glucose, Bld: 84 mg/dL (ref 65–99)
POTASSIUM: 4.1 mmol/L (ref 3.5–5.1)
SODIUM: 138 mmol/L (ref 135–145)

## 2017-01-21 LAB — CBC
HCT: 30.4 % — ABNORMAL LOW (ref 36.0–46.0)
HEMOGLOBIN: 9.6 g/dL — AB (ref 12.0–15.0)
MCH: 27.4 pg (ref 26.0–34.0)
MCHC: 31.6 g/dL (ref 30.0–36.0)
MCV: 86.6 fL (ref 78.0–100.0)
Platelets: 381 10*3/uL (ref 150–400)
RBC: 3.51 MIL/uL — AB (ref 3.87–5.11)
RDW: 14.4 % (ref 11.5–15.5)
WBC: 6.2 10*3/uL (ref 4.0–10.5)

## 2017-01-21 LAB — URINE CULTURE: CULTURE: NO GROWTH

## 2017-01-21 MED ORDER — PRO-STAT SUGAR FREE PO LIQD: 30.0000 mL | Freq: Every day | ORAL | 0 refills | Status: DC

## 2017-01-21 NOTE — Clinical Social Work Note (Signed)
Clinical Social Work Assessment  Patient Details  Name: Adriana Middleton MRN: [id number] Date of Birth: 27-Apr-1930  Date of referral:  01/21/17               Reason for consult:  Facility Placement                Permission sought to share information with:  Facility Medical sales representativeContact Representative, Family Supports Permission granted to share information::  No  Name::     Holiday representativeMarian  Agency::  SNFs  Relationship::  Daughter  Contact Information:  [phone]  Housing/Transportation Living arrangements for the past 2 months:  Single Family Home Source of Information:  Adult Children Patient Interpreter Needed:  None Criminal Activity/Legal Involvement Pertinent to Current Situation/Hospitalization:  No - Comment as needed Significant Relationships:  Adult Children Lives with:  Adult Children, Self Do you feel safe going back to the place where you live?  No Need for family participation in patient care:  Yes (Comment)  Care giving concerns:  CSW received consult for possible SNF placement at time of discharge. Patient is disoriented. CSW spoke with patient's daughter regarding PT recommendation of SNF placement at time of discharge. [name] reported given patient's current physical and cognitive needs, she would like patient to get rehab at snf and then live there permanently. CSW to continue to follow and assist with discharge planning needs.   Social Worker assessment / plan:  CSW spoke with patient's daughter concerning [facility] before [facility].  Employment status:  Retired Database administratornsurance information:  Managed Medicare PT Recommendations:  Skilled Nursing Facility Information / Referral to community resources:  Skilled Nursing Facility  Patient/Family's Response to care:  [name] recognizes need for rehab and is agree[place]. CSW explained that after patient's rehab stay, insurance will not pay for patient to live at an ALF or  long term care snf.   Patient/Family's Understanding of and Emotional Response to Diagnosis, Current Treatment, and Prognosis:  Patient/family is realistic regarding therapy needs and expressed being hopeful for SNF placement. [name] expressed understanding of CSW role and discharge process. No questions/concerns about plan or treatment.    Emotional Assessment Appearance:  Appears stated age Attitude/Demeanor/Rapport:  Unable to Assess Affect (typically observed):  Unable to Assess Orientation:   (Disoriented x4) Alcohol / Substance use:  Not Applicable Psych involvement (Current and /or in the community):  No (Comment)  Discharge Needs  Concerns to be addressed:  Care Coordination Readmission within the last 30 days:  No Current discharge risk:  Cognitively Impaired Barriers to Discharge:  Continued Medical Work up   [employer] S Elisama Thissen, LCSWA 01/21/2017, 9:12 AM

## 2017-01-21 NOTE — [place] FL2 (Signed)
[place] MEDICAID FL2 LEVEL OF CARE SCREENING TOOL     IDENTIFICATION  Patient Name: Adriana Middleton Birthdate: December 04, 1929 Sex: female Admission Date (Current Location): 01/19/2017  [facility] and [place] Number:  Producer, television/film/video and Address:  [facility], [address], [place], [place] [address]      Provider Number: [id number]  Attending Physician Name and Address:  Edsel Petrin, DO  Relative Name and Phone Number:  Adriana Middleton, daughter, [phone]    Current Level of Care: Hospital Recommended Level of Care: Skilled Nursing Facility Prior Approval Number:    Date Approved/Denied:   PASRR Number:   [id number]   Discharge Plan: SNF    Current Diagnoses: Patient Active Problem List   Diagnosis Date Noted  . Dehydration 01/19/2017  . Renal failure (ARF), acute on chronic (HCC)   . Troponin level elevated 09/22/2015  . Acute renal failure (ARF) (HCC) 09/22/2015  . Hypercalcemia 09/22/2015  . Acute encephalopathy 09/22/2015  . DNR (do not resuscitate) 09/22/2015  . Palliative care status 09/22/2015  . Hypokalemia 09/22/2015  . Loss of weight 09/22/2015  . Embolic stroke (HCC) 07/19/2015  . Aortic atherosclerosis (HCC)   . Carotid stenosis   . Stroke (HCC)   . HLD (hyperlipidemia)   . Left-sided weakness 06/16/2015  . Acute right arterial ischemic stroke, MCA (middle cerebral artery) (HCC) 06/16/2015  . Facial weakness 06/10/2015  . TIA (transient ischemic attack) 06/10/2015  . UNSPECIFIED DISORDER OF PARATHYROID GLAND 11/14/2010  . CATARACTS, BILATERAL 04/18/2008  . COPD 04/18/2008  . ARTHRITIS, LEFT HIP 04/18/2008  . DEPRESSION 07/08/2007  . Essential hypertension 07/08/2007  . GERD 07/08/2007    Orientation RESPIRATION BLADDER Height & Weight      (Disoriented x4)  Normal Incontinent Weight: 41.9 kg (92 lb 6.4 oz) (scd off the bed) Height:     BEHAVIORAL SYMPTOMS/MOOD NEUROLOGICAL BOWEL NUTRITION STATUS      Continent Diet  (Please see DC Summary)  AMBULATORY STATUS COMMUNICATION OF NEEDS Skin   Limited Assist Verbally Normal                       Personal Care Assistance Level of Assistance  Bathing, Feeding, Dressing Bathing Assistance: Limited assistance Feeding assistance: Limited assistance Dressing Assistance: Limited assistance     Functional Limitations Info             SPECIAL CARE FACTORS FREQUENCY  PT (By licensed PT)     PT Frequency: min 3x/week              Contractures      Additional Factors Info  Code Status, Allergies, Psychotropic Code Status Info: Full Code Allergies Info: Codeine, Simvastatin Psychotropic Info: Wellbutrin         Current Medications (01/21/2017):  This is the current hospital active medication list Current Facility-Administered Medications  Medication Dose Route Frequency Provider Last Rate Last Dose  . 0.9 %  sodium chloride infusion   Intravenous Continuous Dorothea Ogle, MD 75 mL/hr at 01/21/17 0821 1,000 mL at 01/21/17 0821  . acetaminophen (TYLENOL) tablet 650 mg  650 mg Oral Q6H PRN Dorothea Ogle, MD      . amLODipine (NORVASC) tablet 5 mg  5 mg Oral QHS Dorothea Ogle, MD   5 mg at 01/20/17 2224  . atorvastatin (LIPITOR) tablet 20 mg  20 mg Oral Daily Dorothea Ogle, MD   20 mg at 01/21/17 6578  . buPROPion [facility]  SR) 12 hr tablet 100 mg  100 mg Oral Daily Dorothea OgleIskra M Myers, MD   100 mg at 01/21/17 [id number]  . clopidogrel (PLAVIX) tablet 75 mg  75 mg Oral Daily Dorothea OgleIskra M Myers, MD   75 mg at 01/21/17 [id number]  . donepezil (ARICEPT) tablet 10 mg  10 mg Oral QHS Dorothea OgleIskra M Myers, MD   10 mg at 01/20/17 2224  . feeding supplement (BOOST / RESOURCE BREEZE) liquid 1 Container  1 Container Oral TID BM Maryann Mikhail, DO      . feeding supplement (ENSURE ENLIVE) (ENSURE ENLIVE) liquid 237 mL  237 mL Oral Q24H Maryann Mikhail, DO   237 mL at 01/21/17 [id number]  . feeding supplement (PRO-STAT SUGAR FREE 64) liquid 30 mL  30 mL Oral Daily Maryann Mikhail, DO   30  mL at 01/21/17 0928  . heparin injection 5,000 Units  5,000 Units Subcutaneous Q8H Dorothea OgleIskra M Myers, MD   5,000 Units at 01/21/17 0617  . oxyCODONE (Oxy IR/ROXICODONE) immediate release tablet 2.5-5 mg  2.5-5 mg Oral Q4H PRN Dorothea OgleIskra M Myers, MD   5 mg at 01/20/17 1019  . sodium chloride flush (NS) 0.9 % injection 3 mL  3 mL Intravenous Q12H Dorothea OgleIskra M Myers, MD   3 mL at 01/20/17 1021  . traZODone (DESYREL) tablet 50 mg  50 mg Oral QHS PRN Dorothea OgleIskra M Myers, MD   50 mg at 01/19/17 2349     Discharge Medications: Please see discharge summary for a list of discharge medications.  Relevant Imaging Results:  Relevant Lab Results:   Additional Information SSN: [id number]  Adriana Middleton S Adriana Middleton, [place]

## 2017-01-21 NOTE — Progress Notes (Signed)
Patient was discharged to nursing home Adriana Middleton([facility]) by MD order; discharged instructions  review and sent to facility by social worker with care notes; IV DIC; patient will be transported to facility via EMS. Facility was called and report was given to the nurse who is going to receive the patient.

## 2017-01-21 NOTE — Care Management Note (Signed)
Case Management Note  Patient Details  Name: Adriana Middleton MRN: [id number] Date of Birth: [birth year]  Subjective/Objective:        Pt with hx of  COPD, hypertension, depression, dementia, presented  for evaluation of confusion, agitation.  PCP:  Renaye RakersVeita Bland  Action/Plan:   Plan is to d/c to SNF/[facility] today. CSW managing disposition to SNF.  Expected Discharge Date:  01/21/17               Expected Discharge Plan:  Skilled Nursing Facility  In-House Referral:  Clinical Social Work  Discharge planning Services  CM Consult  Post Status of Service:  Completed, signed off  If discussed at [employer] Length of Stay Meetings, dates discussed:    Additional Comments:  Epifanio LeschesCole, Almyra Birman Hudson, RN 01/21/2017, 11:41 AM

## 2017-01-21 NOTE — Consult Note (Signed)
           [facility] Primary Care Navigator  01/21/2017  Adriana Middleton Nov 03, 1930 [id number]   Went to see patient at the bedside to identify possible discharge needs but she was asleep and no family member at the bedside.  Inpatient social worker note reveals that patient's daughter had mentioned that given patient's current physical and cognitive needs, she would like patient to get rehabilitation at skilled nursing facility and then live there permanently.  [facility].  For questions, please contact:  Wyatt HasteLorraine Arlina Sabina, BSN, RN- [facility] Primary Care Navigator  Telephone: [phone] [facility]

## 2017-01-21 NOTE — Progress Notes (Signed)
[facility] Anticipated DC date: 01/21/17 Family notified: Daughter Transport by: Dayna BarkerPTAR 2pm   Per MD [facility]. RN, patient, patient's family, and facility notified of DC. Discharge Summary sent to facility. RN given number for report. DC packet on chart. Ambulance transport requested for patient.   CSW signing off.  Cristobal GoldmannNadia Mavis Fichera, [place] Clinical Social Worker [phone]

## 2017-01-21 NOTE — Discharge Summary (Signed)
Physician Discharge Summary  Adriana Middleton ZOX:[id number] DOB: 02-25-1930 DOA: 01/19/2017  PCP: Geraldo Pitter, MD  Admit date: 01/19/2017 Discharge date: 01/21/2017  Time spent: 45 minutes  Recommendations for Outpatient Follow-up:  Patient will be discharged to [facility]. Continue physical and occupational therapy.  Patient will need to follow up with primary care provider within one week of discharge, repeat CBC, BMP, thyroid studies.  Patient should continue medications as prescribed.  Patient should follow a heart healthy diet.   Discharge Diagnoses:  Acute renal injury imposed on CKD stage III Hypercalcemia Acute metabolic encephalopathy Bradycardia HTN COPD Dementia Depression   Discharge Condition: stable   Diet recommendation: heart healthy  Filed Weights   01/20/17 0616 01/21/17 0257  Weight: 45.3 kg (99 lb 14.4 oz) 41.9 kg (92 lb 6.4 oz)    History of present illness:  on 01/19/2017 by Dr. Danie Binder Worthy Flank a 83 y.o.femalewith known COPD, hypertension, depression, dementia, presented to [facility] ED via EMS for evaluation of confusion, agitation. Please note that pt is rather poor historian and due to confusion and underlying dementia. Daughter at bedside able to provide some information and says mom has not been able to care for herself for a while now and she has already started to look for placement. Py has not been eating well and daughter does not know if she is taking medications correctly. No reports of fevers, no abd or urinary concerns reported.   Hospital Course:  Acute renal injury imposed on CKD stage III -Creatinine upon admission was 2.33 -Currently 1.35 (close to baselinewith baseline Cr 1.2 - 1.6) -Likley secondary to poor oral intake, Micardis  Hypercalcemia -likely secondary to dehydration -On admission, calcium 11.2, currently 10.9 -Repeat in one week  Acute metabolic encephalopathy -imposed on history of known dementia and depression    -secondary to the above -UA unremarkable for infection. Lungs clear on exam -Appeared to be improved.  Bradycardia -TSH 0.253 -Free T4 1.39 -also consider stopping Donepezil if bradycardia does not resolve -no longer bradycardic  Abnormal thyroid studies -Possible due to acute illness- TSH 0.253, FT4 1.39 -Follow up with PCP for repeat testing in 2-3 weeks  HTN -Continue norvasc  COPD -Respiratory status stable at this time, no wheezing on exam  Normocytic anemia -Currently hemoglobin 9.6, no evidence of bleeding -has been stable since admission -Repeat CBC in one week  Dementia -Not taking aricept  Depression  -Continue wellbutrin  Underweight -Nutrition consulted, continue supplements  Deconditioning -PT consulted, recommended SNF  Code status: DNR  Consultants None  Procedures  None  Discharge Exam: Vitals:   01/20/17 2155 01/21/17 0630  BP: (!) 160/78 (!) 138/51  Pulse: 86 74  Resp: 18 18  Temp: 100.2 F (37.9 C) 98.5 F (36.9 C)   No complaints this morning.    Exam  General: Well developed, thin, elderly, NAD  HEENT: NCAT,  mucous membranes moist.   Cardiovascular: S1 S2 auscultated, RRR,  Soft murmur  Respiratory: Clear to auscultation bilaterally with equal chest rise  Abdomen: Soft, nontender, nondistended, + bowel sounds  Extremities: warm dry without cyanosis clubbing or edema  Neuro: AAOx1 (self), has dementia, nonfocal  Psych: Appropriate  Discharge Instructions Discharge Instructions    Discharge instructions    Complete by:  As directed    Patient will be discharged to [facility]. Continue physical and occupational therapy.  Patient will need to follow up with primary care provider within one week of discharge, repeat CBC, BMP, thyroid  studies.  Patient should continue medications as prescribed.  Patient should follow a heart healthy diet.     Current Discharge Medication List    START taking these  medications   Details  Amino Acids-Protein Hydrolys (FEEDING SUPPLEMENT, PRO-STAT SUGAR FREE 64,) LIQD Take 30 mLs by mouth daily. Qty: 900 mL, Refills: 0      CONTINUE these medications which have NOT CHANGED   Details  amLODipine (NORVASC) 5 MG tablet Take 5 mg by mouth at bedtime.     atorvastatin (LIPITOR) 20 MG tablet Take 1 tablet (20 mg total) by mouth daily.    buPROPion (WELLBUTRIN SR) 100 MG 12 hr tablet TAKE 1 TABLET (100 MG TOTAL) BY MOUTH daily Qty: 90 tablet, Refills: 3    clopidogrel (PLAVIX) 75 MG tablet Take 1 tablet (75 mg total) by mouth daily.    acetaminophen (TYLENOL) 325 MG tablet Take 2 tablets (650 mg total) by mouth every 6 (six) hours as needed for mild pain, moderate pain, fever or headache (or Fever >/= 101).    ENSURE PLUS (ENSURE PLUS) LIQD Take 237 mLs by mouth daily.      STOP taking these medications     telmisartan-hydrochlorothiazide (MICARDIS HCT) 40-12.5 MG tablet      donepezil (ARICEPT) 10 MG tablet      oxyCODONE (ROXICODONE) 5 MG immediate release tablet      oxyCODONE-acetaminophen (PERCOCET/ROXICET) 5-325 MG tablet        Allergies  Allergen Reactions  . Codeine Itching  . Simvastatin Itching   Follow-up Information    Geraldo Pitter, MD. Schedule an appointment as soon as possible for a visit in 1 week(s).   Specialty:  Family Medicine Why:  Hospital follow up Contact information: [address] [place] [address] [phone]            The results of significant diagnostics from this hospitalization (including imaging, microbiology, ancillary and laboratory) are listed below for reference.    Significant Diagnostic Studies: Ct Abdomen Pelvis Wo Contrast  Result Date: 12/22/2016 CLINICAL DATA:  Lower back and abdominal pain, history of hypertension, COPD, prior stroke EXAM: CT ABDOMEN AND PELVIS WITHOUT CONTRAST TECHNIQUE: Multidetector CT imaging of the abdomen and pelvis was performed following the  standard protocol without IV contrast. Sagittal and coronal MPR images reconstructed from axial data set. Oral contrast was not administered. COMPARISON:  01/20/2005 FINDINGS: Lower chest: Lung bases clear. Hepatobiliary: Gallbladder unremarkable. Question mild central intrahepatic biliary dilatation. Pancreas: Poorly evaluated due to adjacent bowel. No gross mass or ductal dilatation. Spleen: Normal appearance Adrenals/Urinary Tract: Adrenal glands normal appearance. No definite focal renal abnormalities. No hydronephrosis or ureteral dilatation. Bladder unremarkable. Stomach/Bowel: Stomach incompletely distended, unable to accurately assess gastric wall thickness. Large and small bowel loops grossly normal appearance for technique. Vascular/Lymphatic: Atherosclerotic calcifications aorta including origins of the renal arteries, SMA and celiac artery. Tortuous proximal abdominal aorta without definite aneurysmal dilatation. No adenopathy. Reproductive: Multiple uterine calcifications question calcified leiomyomata. Unremarkable adnexa. Other: No free air free fluid. No hernia or intra-abdominal inflammatory process. Musculoskeletal: Advanced degenerative changes and remodeling of the LEFT hip joint progressive since previous exam with a large LEFT hip joint effusion and acetabulum protrusio. Calcified loose body within LEFT hip joint effusion. Small RIGHT hip joint effusion degenerative changes present. Diffuse osseous demineralization. Degenerative disc disease changes of the thoracolumbar spine with chronic appearing compression fracture of T12 present since 09/22/2015. IMPRESSION: Aortic atherosclerosis and ectasia without aneurysm. Question mild central intrahepatic  biliary dilatation, recommend correlation with LFTs. BILATERAL hip joint effusions LEFT much larger than RIGHT with significant degenerative changes in bone destruction/remodeling at the LEFT hip; if there is clinical concern for septic arthritis  recommend joint aspiration. Electronically Signed   By: Ulyses SouthwardMark  Boles M.D.   On: 12/22/2016 17:45   Dg Lumbar Spine Complete  Result Date: 12/22/2016 CLINICAL DATA:  Left hip and lower back pain. Symptoms for 2 years, worse today. EXAM: LUMBAR SPINE - COMPLETE 4+ VIEW COMPARISON:  None. FINDINGS: No evidence of acute fracture. Moderate compression fracture at T12 is chronic based on April 2017 rib radiography. Diffuse degenerative disc narrowing and facet arthropathy with levoscoliosis. Osteopenia. Extensive atherosclerotic calcification with borderline ectatic abdominal aorta measuring up to 25 mm. IMPRESSION: 1. No acute finding. 2. Remote T12 compression fracture with moderate height loss. 3. Scoliosis and diffuse degenerative changes. Electronically Signed   By: Marnee SpringJonathon  Watts M.D.   On: 12/22/2016 14:04   Ct Head Wo Contrast  Result Date: 12/22/2016 CLINICAL DATA:  Headaches EXAM: CT HEAD WITHOUT CONTRAST TECHNIQUE: Contiguous axial images were obtained from the base of the skull through the vertex without intravenous contrast. COMPARISON:  09/22/2015 FINDINGS: Brain: Diffuse atrophic changes are noted. Changes of prior infarct are seen in the deep white matter in the right frontoparietal region stable from the prior exam. No findings to suggest acute hemorrhage, acute infarction or space-occupying mass lesion are noted. Vascular: No hyperdense vessel or unexpected calcification. Skull: Normal. Negative for fracture or focal lesion. Sinuses/Orbits: No acute finding. Other: None. IMPRESSION: Chronic atrophic and ischemic changes without acute abnormality. Electronically Signed   By: Alcide CleverMark  Lukens M.D.   On: 12/22/2016 14:14   Dg Hip Unilat W Or Wo Pelvis 2-3 Views Left  Result Date: 12/22/2016 CLINICAL DATA:  Left hip and lower back pain. EXAM: DG HIP (WITH OR WITHOUT PELVIS) 2-3V LEFT COMPARISON:  03/12/2016 FINDINGS: Again noted is severe joint space loss in the left hip joint with acetabular  protrusio. Degenerative changes and possibly old traumatic changes at the pubic symphysis. Pelvic bony ring appears intact and similar to the previous examination. There is no evidence for an acute left hip fracture. Disc space narrowing and degenerative changes at L4-L5. IMPRESSION: Severe arthropathy in the left hip joint with acetabular protrusio. No acute bone abnormality. Electronically Signed   By: Richarda OverlieAdam  Henn M.D.   On: 12/22/2016 14:11    Microbiology: No results found for this or any previous visit (from the past 240 hour(s)).   Labs: Basic Metabolic Panel:  Recent Labs Lab 01/19/17 1235 01/20/17 0741 01/21/17 0549  NA 135 137 138  K 4.3 4.5 4.1  CL 100* 101 105  CO2 26 22 23   GLUCOSE 106* 74 84  BUN 46* 33* 20  CREATININE 2.33* 1.64* 1.35*  CALCIUM 11.2* 10.9* 10.9*   Liver Function Tests: No results for input(s): AST, ALT, ALKPHOS, BILITOT, PROT, ALBUMIN in the last 168 hours. No results for input(s): LIPASE, AMYLASE in the last 168 hours. No results for input(s): AMMONIA in the last 168 hours. CBC:  Recent Labs Lab 01/19/17 1235 01/20/17 0741 01/21/17 0549  WBC 4.9 5.4 6.2  HGB 9.1* 9.6* 9.6*  HCT 28.2* 30.4* 30.4*  MCV 86.5 87.4 86.6  PLT 368 366 381   Cardiac Enzymes: No results for input(s): CKTOTAL, CKMB, CKMBINDEX, TROPONINI in the last 168 hours. BNP: BNP (last 3 results) No results for input(s): BNP in the last 8760 hours.  ProBNP (last 3 results)  No results for input(s): PROBNP in the last 8760 hours.  CBG: No results for input(s): GLUCAP in the last 168 hours.     SignedEdsel Petrin  [facility] 01/21/2017, 10:48 AM

## 2017-01-21 NOTE — Clinical Social Work Placement (Signed)
   CLINICAL SOCIAL WORK PLACEMENT  NOTE  Date:  01/21/2017  Patient Details  Name: Adriana Middleton MRN: [id number] Date of Birth: [birth year]  Clinical Social Work is seeking post-discharge placement for this patient at the Skilled  Nursing Facility level of care (*CSW will initial, date and re-position this form in  chart as items are completed):  Yes   Patient/family provided with [facility] Clinical Social Work Department's list of facilities offering this level of care within the geographic area requested by the patient (or if unable, by the patient's family).  Yes   Patient/family informed of their freedom to choose among providers that offer the needed level of care, that participate in Medicare, Medicaid or managed care program needed by the patient, have an available bed and are willing to accept the patient.  Yes   Patient/family informed of [facility]'s ownership interest in [facility] and [facility], as well as of the fact that they are under no obligation to receive care at these facilities.  PASRR submitted to EDS on       PASRR number received on       Existing PASRR number confirmed on 01/21/17     FL2 transmitted to all facilities in geographic area requested by pt/family on 01/21/17     FL2 transmitted to all facilities within larger geographic area on       Patient informed that his/her managed care company has contracts with or will negotiate with certain facilities, including the following:        Yes   Patient/family informed of bed offers received.  [facility]     Physician recommends and patient chooses bed at      [facility]  Patient to be transferred to facility by PTAR     Patient family notified on 01/21/17 of transfer.  Name of family member notified:  [name]     PHYSICIAN Please sign FL2, Please sign DNR     Additional Comment:     _______________________________________________ Mearl LatinNadia S Tesean Stump, LCSWA 01/21/2017, 10:21 AM

## 2017-01-29 ENCOUNTER — Encounter: Payer: Self-pay | Admitting: Internal Medicine

## 2017-01-29 ENCOUNTER — Non-Acute Institutional Stay (SKILLED_NURSING_FACILITY): Payer: Medicare Other | Admitting: Internal Medicine

## 2017-01-29 DIAGNOSIS — M1612 Unilateral primary osteoarthritis, left hip: Secondary | ICD-10-CM | POA: Diagnosis not present

## 2017-01-29 DIAGNOSIS — N183 Chronic kidney disease, stage 3 unspecified: Secondary | ICD-10-CM

## 2017-01-29 DIAGNOSIS — Z8673 Personal history of transient ischemic attack (TIA), and cerebral infarction without residual deficits: Secondary | ICD-10-CM

## 2017-01-29 DIAGNOSIS — I69391 Dysphagia following cerebral infarction: Secondary | ICD-10-CM

## 2017-01-29 DIAGNOSIS — D638 Anemia in other chronic diseases classified elsewhere: Secondary | ICD-10-CM

## 2017-01-29 DIAGNOSIS — I10 Essential (primary) hypertension: Secondary | ICD-10-CM | POA: Diagnosis not present

## 2017-01-29 DIAGNOSIS — M159 Polyosteoarthritis, unspecified: Secondary | ICD-10-CM

## 2017-01-29 DIAGNOSIS — R5381 Other malaise: Secondary | ICD-10-CM | POA: Diagnosis not present

## 2017-01-29 DIAGNOSIS — F32A Depression, unspecified: Secondary | ICD-10-CM

## 2017-01-29 DIAGNOSIS — F0150 Vascular dementia without behavioral disturbance: Secondary | ICD-10-CM

## 2017-01-29 DIAGNOSIS — F329 Major depressive disorder, single episode, unspecified: Secondary | ICD-10-CM | POA: Diagnosis not present

## 2017-01-29 DIAGNOSIS — E46 Unspecified protein-calorie malnutrition: Secondary | ICD-10-CM

## 2017-01-29 NOTE — Progress Notes (Signed)
[facility]  PCP: Geraldo PitterBLAND,VEITA J, MD   Code Status: DNR  Goals of care: Advanced Directive information Advanced Directives 01/29/2017  Does Patient Have a Medical Advance Directive? Yes  Type of Advance Directive Out of facility DNR (pink MOST or yellow form)  Does patient want to make changes to medical advance directive? No - Patient declined  Copy of Healthcare Power of Attorney in Chart? -  Would patient like information on creating a medical advance directive? -       Extended Emergency Contact Information Primary Emergency Contact: [name],[name] Address: [address]          [place], [place] [address] [place] Home Phone: [phone] Mobile Phone: [phone] Relation: Daughter Secondary Emergency Contact: Tenny CrawEvans,Preston  [place] Mobile Phone: [phone] Relation: Son   Allergies  Allergen Reactions  . Codeine Itching  . Simvastatin Itching    Chief Complaint  Patient presents with  . New Admit To SNF    New Admission Visit     HPI:  Patient is a 83 y.o. female seen today for short term rehabilitation post hospital admission from 20th of February 2018-22nd of February 2018 with altered mental status from acute on chronic renal failure and hypercalcemia. She responded well to IV fluids. She has medical history of COPD, essential hypertension, depression, dementia among others. She is seen in her room today.  Review of Systems:  Constitutional: Negative for fever HENT: Negative for headache, congestion, nasal discharge Eyes: Negative for double vision and discharge.  Respiratory: Negative for cough, shortness of breath  Cardiovascular: Negative for chest pain. Positive for swelling in her feet. Gastrointestinal: Negative for heartburn, nausea, vomiting, abdominal pain. Had a bowel movement this morning. Genitourinary: Negative for dysuria.  Musculoskeletal: Negative for fall.  positive for arthritis pain and low  back pain. Skin: Negative for itching, rash.  Neurological: Negative for dizziness. Psychiatric/Behavioral: Positive for dementia.   Past Medical History:  Diagnosis Date  . ARTHRITIS, LEFT HIP 04/18/2008  . CATARACTS, BILATERAL 04/18/2008  . COPD 04/18/2008  . DEPRESSION 07/08/2007  . GERD 07/08/2007  . HYPERTENSION 07/08/2007  . Stroke (HCC)   . Unspecified disorder of parathyroid gland 11/14/2010   Past Surgical History:  Procedure Laterality Date  . COLOSTOMY     partial  . EP IMPLANTABLE DEVICE N/A 06/18/2015   Procedure: Loop Recorder Insertion;  Surgeon: Marinus MawGregg W Taylor, MD;  [facility]: [facility];  Service: Cardiovascular;  Laterality: N/A;  . TEE WITHOUT CARDIOVERSION N/A 06/18/2015   Procedure: TRANSESOPHAGEAL ECHOCARDIOGRAM (TEE);  Surgeon: Chrystie NoseKenneth C Hilty, MD;  [facility];  Service: Cardiovascular;  Laterality: N/A;   Social History:   reports that she has quit smoking. She has never used smokeless tobacco. She reports that she does not drink alcohol or use drugs.  Family History  Problem Relation Age of Onset  . Diabetes Other   . Hypertension Other   . Cancer Other     prostate  . Heart disease Other     Medications: Allergies as of 01/29/2017      Reactions   Codeine Itching   Simvastatin Itching      Medication List       Accurate as of 01/29/17  4:03 PM. Always use your most recent med list.          acetaminophen 325 MG tablet Commonly known as:  TYLENOL Take 2 tablets (650 mg total) by mouth every 6 (six) hours as  needed for mild pain, moderate pain, fever or headache (or Fever >/= 101).   amLODipine 5 MG tablet Commonly known as:  NORVASC Take 5 mg by mouth at bedtime.   atorvastatin 20 MG tablet Commonly known as:  LIPITOR Take 1 tablet (20 mg total) by mouth daily.   buPROPion 100 MG 12 hr tablet Commonly known as:  WELLBUTRIN SR TAKE 1 TABLET (100 MG TOTAL) BY MOUTH daily   clopidogrel 75 MG tablet Commonly known as:   PLAVIX Take 1 tablet (75 mg total) by mouth daily.   feeding supplement (PRO-STAT SUGAR FREE 64) Liqd Take 30 mLs by mouth daily.   UNABLE TO FIND Med Name: Med pass 120 mL by mouth 2 times daily       Immunizations: Immunization History  Administered Date(s) Administered  . PPD Test 01/21/2017  . Pneumococcal Polysaccharide-23 11/30/2004  . Td 11/30/2004     Physical Exam: Vitals:   01/29/17 1556  BP: (!) 146/75  Pulse: 99  Resp: 20  Temp: 98.7 F (37.1 C)  TempSrc: Oral  SpO2: 95%  Weight: 92 lb 6.4 oz (41.9 kg)  Height: 5\' 2"  (1.575 m)   Body mass index is 16.9 kg/m.  General- elderly female, Frail and thin built, in no acute distress Head- normocephalic, atraumatic Nose- no maxillary or frontal sinus tenderness, no nasal discharge Throat- moist mucus membrane  Eyes- PERRLA, EOMI, no pallor, no icterus Neck- no cervical lymphadenopathy Cardiovascular- normal s1,s2, no murmur Respiratory- bilateral clear to auscultation, no wheeze, no rhonchi, no crackles, no use of accessory muscles Abdomen- bowel sounds present, soft, non tender, no guarding or rigidity Musculoskeletal- able to move all 4 extremities, on a wheelchair, trace edema around the ankle, arthritis changes to her fingers, left shoulder range of motion limited, weakness more prominent to her lower extremities. Neurological- alert and oriented to person and place but not to time Skin- warm and dry Psychiatry- normal mood and affect    Labs reviewed: Basic Metabolic Panel:  Recent Labs  16/10/96 1235 01/20/17 0741 01/21/17 0549  NA 135 137 138  K 4.3 4.5 4.1  CL 100* 101 105  CO2 26 22 23   GLUCOSE 106* 74 84  BUN 46* 33* 20  CREATININE 2.33* 1.64* 1.35*  CALCIUM 11.2* 10.9* 10.9*   Liver Function Tests:  Recent Labs  02/13/16 1638 12/22/16 1454  AST 16 25  ALT 8 15  ALKPHOS 91 85  BILITOT 0.2 0.6  PROT 6.7 7.3  ALBUMIN 4.1 4.0   No results for input(s): LIPASE, AMYLASE in  the last 8760 hours. No results for input(s): AMMONIA in the last 8760 hours. CBC:  Recent Labs  02/13/16 1638 03/12/16 0756  01/19/17 1235 01/20/17 0741 01/21/17 0549  WBC 5.6 6.7  < > 4.9 5.4 6.2  NEUTROABS 4.0 5.0  --   --   --   --   HGB  --  11.6*  < > 9.1* 9.6* 9.6*  HCT 34.6 35.9*  < > 28.2* 30.4* 30.4*  MCV 97 96.2  < > 86.5 87.4 86.6  PLT 244 217  < > 368 366 381  < > = values in this interval not displayed.    Assessment/Plan  Physical deconditioning With recent hospitalization. Will have her work with physical therapy and occupational therapy to help regain her strength and restore her balance. Fall precautions to be taken.  Chronic kidney disease stage III With acute on chronic renal failure causing her admission. Status post IV  fluids. Encourage hydration. Monitor urine output.  Hypercalcemia Status post IV fluids in the hospital. Monitor her calcium level. Maintain hydration.  Anemia of chronic disease Monitor CBC  Dementia Currently not on any medication. I K vascular given her history of stroke. Provide supportive care for now. Monitor for behavior changes.  Chronic depression Mood stable. Continue Wellbutrin, no changes made  Hypertension Monitor blood pressure readings every shift for now. Continue Norvasc 5 mg daily. Check BMP  Dysphagia Likely from late effect of CVA. Currently on mechanical soft diet with thin liquid. Followed by speech therapy team. Aspiration precautions to be taken.  Osteoarthritis On Tylenol 650 mg every 6 hours as needed for pain. To work with physical therapy and occupational therapy for range of motion exercise. Monitor.  Protein calorie malnutrition Encourage oral intake. Continue med Pass supplement and protein supplement. Registered dietitian to follow.  History of stroke Continue Plavix and statin.  Goals of care: short term rehabilitation, possible long-term care   Labs/tests ordered: CBC, CMP  02/01/2017  Family/ staff Communication: reviewed care plan with patient and nursing supervisor    Oneal Grout, MD Internal Medicine [facility] [address] [place], [place] [address] Cell Phone (Monday-Friday 8 am - 5 pm): [phone] On Call: [phone] and follow prompts after 5 pm and on weekends Office Phone: [phone] Office Fax: [phone]

## 2017-02-01 LAB — CBC AND DIFFERENTIAL
HCT: 24 % — AB (ref 36–46)
HEMOGLOBIN: 7.3 g/dL — AB (ref 12.0–16.0)
PLATELETS: 510 10*3/uL — AB (ref 150–399)
WBC: 8 10^3/mL

## 2017-02-01 LAB — HEPATIC FUNCTION PANEL
ALT: 65 U/L — AB (ref 7–35)
AST: 61 U/L — AB (ref 13–35)
Alkaline Phosphatase: 84 U/L (ref 25–125)
Bilirubin, Total: 0.3 mg/dL

## 2017-02-01 LAB — BASIC METABOLIC PANEL
BUN: 18 mg/dL (ref 4–21)
Creatinine: 0.9 mg/dL (ref 0.5–1.1)
POTASSIUM: 4.5 mmol/L (ref 3.4–5.3)
SODIUM: 142 mmol/L (ref 137–147)

## 2017-02-02 ENCOUNTER — Non-Acute Institutional Stay (SKILLED_NURSING_FACILITY): Payer: Medicare Other | Admitting: Family

## 2017-02-02 DIAGNOSIS — E8809 Other disorders of plasma-protein metabolism, not elsewhere classified: Secondary | ICD-10-CM | POA: Diagnosis not present

## 2017-02-02 DIAGNOSIS — E440 Moderate protein-calorie malnutrition: Secondary | ICD-10-CM

## 2017-02-02 DIAGNOSIS — R748 Abnormal levels of other serum enzymes: Secondary | ICD-10-CM | POA: Diagnosis not present

## 2017-02-02 DIAGNOSIS — R399 Unspecified symptoms and signs involving the genitourinary system: Secondary | ICD-10-CM | POA: Diagnosis not present

## 2017-02-02 DIAGNOSIS — D509 Iron deficiency anemia, unspecified: Secondary | ICD-10-CM | POA: Diagnosis not present

## 2017-02-02 NOTE — Progress Notes (Signed)
Location:  [facility] Room Number: 801 B  Place of Service:  SNF (31) Provider:  Sherronda Sweigert FNP-C   Geraldo PitterBLAND,VEITA J, MD  Patient Care Team: Renaye RakersVeita Bland, MD as PCP - [facility](Family Medicine)  Extended Emergency Contact Information Primary Emergency Contact: [name],[name] Address: [address]          [place], [place] [address] [place] Home Phone: [phone] Mobile Phone: [phone] Relation: Daughter Secondary Emergency Contact: Tenny CrawEvans,Preston  [place] Mobile Phone: [phone] Relation: Son  Code Status:  DNR  Goals of care: Advanced Directive information Advanced Directives 01/29/2017  Does Patient Have a Medical Advance Directive? Yes  Type of Advance Directive Out of facility DNR (pink MOST or yellow form)  Does patient want to make changes to medical advance directive? No - Patient declined  Copy of Healthcare Power of Attorney in Chart? -  Would patient like information on creating a medical advance directive? -     Chief Complaint  Patient presents with  . Acute Visit    abnormal results     HPI:  Pt is a 83 y.o. female [facility]  for an acute visit for abnormal labs. She has a medical history of HTN, Stroke, COPD, GERD among other conditions. She is seen in her room today. She complains of burning sensation with voiding. She denies any fever or chills. Her recent lab results showed Hgb 7.3, HCT 24.2, PLT 510, TP 5.4, ALb 2.63, AST 61, ALT 65 ( 02/01/2017).    Past Medical History:  Diagnosis Date  . ARTHRITIS, LEFT HIP 04/18/2008  . CATARACTS, BILATERAL 04/18/2008  . COPD 04/18/2008  . DEPRESSION 07/08/2007  . GERD 07/08/2007  . HYPERTENSION 07/08/2007  . Stroke (HCC)   . Unspecified disorder of parathyroid gland 11/14/2010   Past Surgical History:  Procedure Laterality Date  . COLOSTOMY     partial  . EP IMPLANTABLE DEVICE N/A 06/18/2015   Procedure: Loop  Recorder Insertion;  Surgeon: Marinus MawGregg W Taylor, MD;  [facility];  Service: Cardiovascular;  Laterality: N/A;  . TEE WITHOUT CARDIOVERSION N/A 06/18/2015   Procedure: TRANSESOPHAGEAL ECHOCARDIOGRAM (TEE);  Surgeon: Chrystie NoseKenneth C Hilty, MD;  [facility];  Service: Cardiovascular;  Laterality: N/A;    Allergies  Allergen Reactions  . Codeine Itching  . Simvastatin Itching    Allergies as of 02/02/2017      Reactions   Codeine Itching   Simvastatin Itching      Medication List       Accurate as of 02/02/17  2:58 PM. Always use your most recent med list.          acetaminophen 325 MG tablet Commonly known as:  TYLENOL Take 2 tablets (650 mg total) by mouth every 6 (six) hours as needed for mild pain, moderate pain, fever or headache (or Fever >/= 101).   amLODipine 5 MG tablet Commonly known as:  NORVASC Take 5 mg by mouth at bedtime.   atorvastatin 20 MG tablet Commonly known as:  LIPITOR Take 1 tablet (20 mg total) by mouth daily.   buPROPion 100 MG 12 hr tablet Commonly known as:  WELLBUTRIN SR TAKE 1 TABLET (100 MG TOTAL) BY MOUTH daily   clopidogrel 75 MG tablet Commonly known as:  PLAVIX Take 1 tablet (75 mg total) by mouth daily.   feeding supplement (PRO-STAT SUGAR FREE 64) Liqd Take 30 mLs by mouth daily.  UNABLE TO FIND Med Name: Med pass 120 mL by mouth 2 times daily       Review of Systems  Constitutional: Negative for activity change, appetite change, chills, fatigue and fever.  HENT: Negative for congestion, rhinorrhea, sinus pain, sinus pressure, sneezing and sore throat.   Eyes: Negative.   Respiratory: Negative for cough, chest tightness, shortness of breath and wheezing.   Cardiovascular: Negative for chest pain, palpitations and leg swelling.  Gastrointestinal: Negative for abdominal distention, abdominal pain, constipation, diarrhea, nausea and vomiting.  Genitourinary: Negative for flank pain, frequency and urgency.        Burning sensation with voiding   Musculoskeletal: Positive for gait problem.  Skin: Negative for color change, pallor and rash.  Neurological: Negative for dizziness, syncope, light-headedness and headaches.  Hematological: Does not bruise/bleed easily.  Psychiatric/Behavioral: Negative for agitation, confusion, hallucinations and sleep disturbance. The patient is not nervous/anxious.     Immunization History  Administered Date(s) Administered  . PPD Test 01/21/2017  . Pneumococcal Polysaccharide-23 11/30/2004  . Td 11/30/2004   Pertinent  Health Maintenance Due  Topic Date Due  . DEXA SCAN  02/13/1997  . PNA vac Low Risk Adult (2 of 2 - PCV13) 11/30/2005  . INFLUENZA VACCINE  06/30/2016    Vitals:   02/02/17 1115  BP: (!) 123/55  Pulse: 85  Resp: 18  Temp: 98.8 F (37.1 C)  SpO2: 96%  Weight: 94 lb 9.6 oz (42.9 kg)  Height: 5\' 2"  (1.575 m)   Body mass index is 17.3 kg/m. Physical Exam  Constitutional:  Thin frail elderly in no acute distress   HENT:  Head: Normocephalic.  Mouth/Throat: Oropharynx is clear and moist. No oropharyngeal exudate.  Eyes: Conjunctivae and EOM are normal. Pupils are equal, round, and reactive to light. Right eye exhibits no discharge. Left eye exhibits no discharge. No scleral icterus.  Neck: Normal range of motion. No JVD present. No thyromegaly present.  Cardiovascular: Normal rate, regular rhythm, normal heart sounds and intact distal pulses.  Exam reveals no gallop and no friction rub.   No murmur heard. Pulmonary/Chest: Effort normal and breath sounds normal. No respiratory distress. She has no wheezes. She has no rales.  Abdominal: Soft. Bowel sounds are normal. She exhibits no distension. There is no tenderness. There is no rebound and no guarding.  Genitourinary:  Genitourinary Comments: Incontinent   Musculoskeletal: She exhibits no edema, tenderness or deformity.  Moves x 4 extremities.Unsteady gait   Lymphadenopathy:    She has  no cervical adenopathy.  Neurological: She is alert.  Skin: Skin is warm and dry. No rash noted. No erythema. No pallor.  Psychiatric: She has a normal mood and affect.    Labs reviewed:  Recent Labs  01/19/17 1235 01/20/17 0741 01/21/17 0549 02/01/17  NA 135 137 138 142  K 4.3 4.5 4.1 4.5  CL 100* 101 105  --   CO2 26 22 23   --   GLUCOSE 106* 74 84  --   BUN 46* 33* 20 18  CREATININE 2.33* 1.64* 1.35* 0.9  CALCIUM 11.2* 10.9* 10.9*  --     Recent Labs  02/13/16 1638 12/22/16 1454 02/01/17  AST 16 25 61*  ALT 8 15 65*  ALKPHOS 91 85 84  BILITOT 0.2 0.6  --   PROT 6.7 7.3  --   ALBUMIN 4.1 4.0  --     Recent Labs  02/13/16 1638 03/12/16 0756  01/19/17 1235 01/20/17 0741 01/21/17 0549 02/01/17  WBC 5.6 6.7  < > 4.9 5.4 6.2 8.0  NEUTROABS 4.0 5.0  --   --   --   --   --   HGB  --  11.6*  < > 9.1* 9.6* 9.6* 7.3*  HCT 34.6 35.9*  < > 28.2* 30.4* 30.4* 24*  MCV 97 96.2  < > 86.5 87.4 86.6  --   PLT 244 217  < > 368 366 381 510*  < > = values in this interval not displayed. Lab Results  Component Value Date   TSH 0.253 (L) 01/19/2017   Lab Results  Component Value Date   HGBA1C 5.6 06/17/2015   Lab Results  Component Value Date   CHOL 209 (H) 06/17/2015   HDL 52 06/17/2015   LDLCALC 138 (H) 06/17/2015   LDLDIRECT 173.0 04/11/2007   TRIG 96 06/17/2015   CHOLHDL 4.0 06/17/2015   Assessment/Plan 1. Moderate protein-calorie malnutrition   TP 5.4 ( 02/01/2017). Continue on Med pass 120 ml twice daily.RD consult to monitor protein supplements. Recheck CMP 02/09/2017  2. Hypoalbuminemia ALb 2.63  (02/01/2017).Continue on Prostat 30 mls daily.RD consult to monitor Recheck CMP 3/1/32018   3. Iron deficiency anemia Hgb 7.3, HCT 24.2 ( 02/01/2017). Previous Hgb 9.6 ( 01/21/2017).Ferrous sulfate 325 mg Tablet one by mouth once daily. Add colace 100 mg Capsule on by mouth daily.   4. Elevated liver enzymes AST 61, ALT 65 ( 02/01/2017). Will reduce Lipitor to 10 mg Tablet  daily. Recheck CMP and Lipid panel 02/09/2017.   5. Urinary tract infection symptoms Afebrile.Reports burning sensation with voiding.Obtain urine specimen for U/A and C/S rule out UTI.      Family/ staff Communication: reviewed plan of care with patient and facility Nurse supervisor.   Labs/tests ordered:  CBC, CMP and lipid panel 02/09/2017. urine specimen for U/A and C/S rule out UTI.

## 2017-02-03 DIAGNOSIS — M1612 Unilateral primary osteoarthritis, left hip: Secondary | ICD-10-CM | POA: Diagnosis not present

## 2017-02-10 ENCOUNTER — Ambulatory Visit: Payer: Medicare Other | Admitting: Adult Health

## 2017-02-11 ENCOUNTER — Encounter: Payer: Self-pay | Admitting: Adult Health

## 2017-02-11 LAB — BASIC METABOLIC PANEL
BUN: 26 mg/dL — AB (ref 4–21)
Creatinine: 1.2 mg/dL — AB (ref 0.5–1.1)
Glucose: 83 mg/dL
Potassium: 4.7 mmol/L (ref 3.4–5.3)
SODIUM: 141 mmol/L (ref 137–147)

## 2017-02-11 LAB — HEPATIC FUNCTION PANEL
ALK PHOS: 81 U/L (ref 25–125)
ALT: 32 U/L (ref 7–35)
AST: 26 U/L (ref 13–35)
BILIRUBIN, TOTAL: 0.3 mg/dL

## 2017-02-11 LAB — CBC AND DIFFERENTIAL
HEMATOCRIT: 22 % — AB (ref 36–46)
HEMOGLOBIN: 6.9 g/dL — AB (ref 12.0–16.0)
Platelets: 672 10*3/uL — AB (ref 150–399)
WBC: 7.9 10^3/mL

## 2017-02-12 ENCOUNTER — Observation Stay (HOSPITAL_COMMUNITY)
Admission: EM | Admit: 2017-02-12 | Discharge: 2017-02-14 | Disposition: A | Discharge status: Home / Self Care | Payer: Medicare Other | Attending: Internal Medicine | Admitting: Internal Medicine

## 2017-02-12 DIAGNOSIS — E860 Dehydration: Secondary | ICD-10-CM | POA: Diagnosis present

## 2017-02-12 DIAGNOSIS — R740 Nonspecific elevation of levels of transaminase and lactic acid dehydrogenase [LDH]: Secondary | ICD-10-CM | POA: Diagnosis not present

## 2017-02-12 DIAGNOSIS — Z79899 Other long term (current) drug therapy: Secondary | ICD-10-CM | POA: Insufficient documentation

## 2017-02-12 DIAGNOSIS — D649 Anemia, unspecified: Secondary | ICD-10-CM | POA: Diagnosis present

## 2017-02-12 DIAGNOSIS — I129 Hypertensive chronic kidney disease with stage 1 through stage 4 chronic kidney disease, or unspecified chronic kidney disease: Secondary | ICD-10-CM | POA: Diagnosis not present

## 2017-02-12 DIAGNOSIS — N179 Acute kidney failure, unspecified: Principal | ICD-10-CM | POA: Diagnosis present

## 2017-02-12 DIAGNOSIS — D638 Anemia in other chronic diseases classified elsewhere: Secondary | ICD-10-CM | POA: Insufficient documentation

## 2017-02-12 DIAGNOSIS — N183 Chronic kidney disease, stage 3 unspecified: Secondary | ICD-10-CM | POA: Diagnosis present

## 2017-02-12 DIAGNOSIS — E785 Hyperlipidemia, unspecified: Secondary | ICD-10-CM | POA: Diagnosis present

## 2017-02-12 DIAGNOSIS — J449 Chronic obstructive pulmonary disease, unspecified: Secondary | ICD-10-CM | POA: Diagnosis not present

## 2017-02-12 DIAGNOSIS — R946 Abnormal results of thyroid function studies: Secondary | ICD-10-CM | POA: Insufficient documentation

## 2017-02-12 DIAGNOSIS — Z8673 Personal history of transient ischemic attack (TIA), and cerebral infarction without residual deficits: Secondary | ICD-10-CM | POA: Diagnosis not present

## 2017-02-12 DIAGNOSIS — L8990 Pressure ulcer of unspecified site, unspecified stage: Secondary | ICD-10-CM | POA: Insufficient documentation

## 2017-02-12 DIAGNOSIS — F0390 Unspecified dementia without behavioral disturbance: Secondary | ICD-10-CM | POA: Diagnosis present

## 2017-02-12 DIAGNOSIS — Z87891 Personal history of nicotine dependence: Secondary | ICD-10-CM | POA: Diagnosis not present

## 2017-02-12 DIAGNOSIS — R7989 Other specified abnormal findings of blood chemistry: Secondary | ICD-10-CM | POA: Diagnosis present

## 2017-02-12 DIAGNOSIS — I10 Essential (primary) hypertension: Secondary | ICD-10-CM | POA: Diagnosis present

## 2017-02-12 DIAGNOSIS — K219 Gastro-esophageal reflux disease without esophagitis: Secondary | ICD-10-CM | POA: Diagnosis present

## 2017-02-12 DIAGNOSIS — R748 Abnormal levels of other serum enzymes: Secondary | ICD-10-CM | POA: Diagnosis present

## 2017-02-12 LAB — URINALYSIS, ROUTINE W REFLEX MICROSCOPIC
BILIRUBIN URINE: NEGATIVE
Glucose, UA: NEGATIVE mg/dL
HGB URINE DIPSTICK: NEGATIVE
Ketones, ur: NEGATIVE mg/dL
Leukocytes, UA: NEGATIVE
Nitrite: NEGATIVE
PROTEIN: NEGATIVE mg/dL
Specific Gravity, Urine: 1.012 (ref 1.005–1.030)
pH: 6 (ref 5.0–8.0)

## 2017-02-12 LAB — CBC WITH DIFFERENTIAL/PLATELET
BASOS ABS: 0 10*3/uL (ref 0.0–0.1)
BASOS PCT: 0 %
EOS ABS: 0.1 10*3/uL (ref 0.0–0.7)
EOS PCT: 2 %
HEMATOCRIT: 24.9 % — AB (ref 36.0–46.0)
Hemoglobin: 7.6 g/dL — ABNORMAL LOW (ref 12.0–15.0)
Lymphocytes Relative: 9 %
Lymphs Abs: 0.7 10*3/uL (ref 0.7–4.0)
MCH: 26 pg (ref 26.0–34.0)
MCHC: 30.5 g/dL (ref 30.0–36.0)
MCV: 85.3 fL (ref 78.0–100.0)
MONO ABS: 0.9 10*3/uL (ref 0.1–1.0)
MONOS PCT: 12 %
NEUTROS ABS: 5.8 10*3/uL (ref 1.7–7.7)
Neutrophils Relative %: 77 %
PLATELETS: 611 10*3/uL — AB (ref 150–400)
RBC: 2.92 MIL/uL — ABNORMAL LOW (ref 3.87–5.11)
RDW: 15.7 % — AB (ref 11.5–15.5)
WBC: 7.5 10*3/uL (ref 4.0–10.5)

## 2017-02-12 LAB — BASIC METABOLIC PANEL
ANION GAP: 9 (ref 5–15)
BUN: 23 mg/dL — ABNORMAL HIGH (ref 6–20)
CALCIUM: 10.5 mg/dL — AB (ref 8.9–10.3)
CO2: 24 mmol/L (ref 22–32)
CREATININE: 1.26 mg/dL — AB (ref 0.44–1.00)
Chloride: 105 mmol/L (ref 101–111)
GFR, EST AFRICAN AMERICAN: 44 mL/min — AB (ref >60)
GFR, EST NON AFRICAN AMERICAN: 38 mL/min — AB (ref >60)
Glucose, Bld: 106 mg/dL — ABNORMAL HIGH (ref 65–99)
Potassium: 3.9 mmol/L (ref 3.5–5.1)
SODIUM: 138 mmol/L (ref 135–145)

## 2017-02-12 LAB — ABO/RH: ABO/RH(D): O POS

## 2017-02-12 LAB — PREPARE RBC (CROSSMATCH)

## 2017-02-12 MED ORDER — BUPROPION HCL ER (SR) 100 MG PO TB12
100.0000 mg | ORAL_TABLET | Freq: Two times a day (BID) | ORAL | Status: DC
  Administered 2017-02-12 – 2017-02-14 (×4): 100 mg via ORAL
  Filled 2017-02-12 (×5): qty 1

## 2017-02-12 MED ORDER — ATORVASTATIN CALCIUM 10 MG PO TABS
10.0000 mg | ORAL_TABLET | Freq: Every day | ORAL | Status: DC
  Administered 2017-02-12 – 2017-02-14 (×3): 10 mg via ORAL
  Filled 2017-02-12 (×3): qty 1

## 2017-02-12 MED ORDER — ACETAMINOPHEN 325 MG PO TABS
650.0000 mg | ORAL_TABLET | Freq: Four times a day (QID) | ORAL | Status: DC | PRN
  Administered 2017-02-13 – 2017-02-14 (×4): 650 mg via ORAL
  Filled 2017-02-12 (×5): qty 2

## 2017-02-12 MED ORDER — BOOST / RESOURCE BREEZE PO LIQD
1.0000 | Freq: Three times a day (TID) | ORAL | Status: DC
  Administered 2017-02-12 – 2017-02-14 (×5): 1 via ORAL

## 2017-02-12 MED ORDER — ONDANSETRON HCL 4 MG PO TABS: 4.0000 mg | ORAL_TABLET | Freq: Four times a day (QID) | ORAL | Status: DC | PRN

## 2017-02-12 MED ORDER — SODIUM CHLORIDE 0.9 % IV SOLN
Freq: Once | INTRAVENOUS | Status: AC
  Administered 2017-02-12: 20:00:00 via INTRAVENOUS

## 2017-02-12 MED ORDER — SODIUM CHLORIDE 0.9 % IV SOLN
INTRAVENOUS | Status: DC
  Administered 2017-02-12 – 2017-02-14 (×2): via INTRAVENOUS

## 2017-02-12 MED ORDER — ACETAMINOPHEN 650 MG RE SUPP: 650.0000 mg | Freq: Four times a day (QID) | RECTAL | Status: DC | PRN

## 2017-02-12 MED ORDER — ONDANSETRON HCL 4 MG/2ML IJ SOLN: 4.0000 mg | Freq: Four times a day (QID) | INTRAMUSCULAR | Status: DC | PRN

## 2017-02-12 NOTE — ED Provider Notes (Signed)
[facility]-EMERG[facility]Y DEPT Provider Note   CSN: [id number] Arrival date & time: 02/12/17  1009     History   Chief Complaint No chief complaint on file.   HPI Adriana Middleton is a 83 y.o. female.  Patient sent in from nursing facility as [facility]. Patient therefore rehabilitation following recent admission. Patient was admitted in February for failure to thrive as an adult.     Patient sent in for a significant anemia with a hemoglobin reported to be 6.9. Patient when she went into rehabilitation was noted to have anemia of chronic disease. In the had some low hemoglobins and he decided to follow them closely.  Past Medical History:  Diagnosis Date  . ARTHRITIS, LEFT HIP 04/18/2008  . CATARACTS, BILATERAL 04/18/2008  . COPD 04/18/2008  . DEPRESSION 07/08/2007  . GERD 07/08/2007  . HYPERTENSION 07/08/2007  . Stroke (HCC)   . 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 Symptomatic anemia 02/12/2017  . Acute kidney injury (HCC) 02/12/2017  . Abnormal TSH 02/12/2017  . Dementia 02/12/2017  . CKD (chronic kidney disease), stage III 02/12/2017  . Abnormal transaminases 02/12/2017  . Dehydration 01/19/2017  . Renal failure (ARF), acute on chronic (HCC)   . Troponin level elevated 09/22/2015  . Acute renal failure (ARF) (HCC) 09/22/2015  . Hypercalcemia 09/22/2015  . Acute encephalopathy 09/22/2015  . DNR (do not resuscitate) 09/22/2015  . Palliative care status 09/22/2015  . Hypokalemia 09/22/2015  . Loss of weight 09/22/2015  . Embolic stroke (HCC) 07/19/2015  . Aortic atherosclerosis (HCC)   . Carotid stenosis   . History of CVA in adulthood   . HLD (hyperlipidemia)   . Left-sided weakness 06/16/2015  . Acute right arterial ischemic stroke, MCA (middle cerebral artery) (HCC) 06/16/2015  . Facial weakness 06/10/2015  . TIA (transient ischemic attack) 06/10/2015  . UNSPECIFIED DISORDER OF PARATHYROID GLAND  11/14/2010  . CATARACTS, BILATERAL 04/18/2008  . COPD (chronic obstructive pulmonary disease) (HCC) 04/18/2008  . ARTHRITIS, LEFT HIP 04/18/2008  . DEPRESSION 07/08/2007  . Essential hypertension 07/08/2007  . GERD 07/08/2007    Past Surgical History:  Procedure Laterality Date  . COLOSTOMY     partial  . EP IMPLANTABLE DEVICE N/A 06/18/2015   Procedure: Loop Recorder Insertion;  Surgeon: Marinus Maw, MD;  [facility] INV[facility];  Service: Cardiovascular;  Laterality: N/A;  . TEE WITHOUT CARDIOVERSION N/A 06/18/2015   Procedure: TRANSESOPHAGEAL ECHOCARDIOGRAM (TEE);  Surgeon: Chrystie Nose, MD;  Location: [facility] ENDOSCOPY;  Service: Cardiovascular;  Laterality: N/A;    OB History    No data available       Home Medications    Prior to Admission medications   Medication Sig Start Date End Date Taking? Authorizing Provider  acetaminophen (TYLENOL) 325 MG tablet Take 2 tablets (650 mg total) by mouth every 6 (six) hours as needed for mild pain, moderate pain, fever or headache (or Fever >/= 101). 09/23/15  Yes Elease Etienne, MD  amLODipine (NORVASC) 5 MG tablet Take 5 mg by mouth at bedtime.  08/01/16  Yes Historical Provider, MD  atorvastatin (LIPITOR) 20 MG tablet Take 1 tablet (20 mg total) by mouth daily. Patient taking differently: Take 10 mg by mouth daily.  07/19/15  Yes Micki Riley, MD  buPROPion (WELLBUTRIN SR) 100 MG 12 hr tablet TAKE 1 TABLET (100 MG TOTAL) BY MOUTH daily 10/13/16  Yes Micki Riley, MD  clopidogrel (PLAVIX) 75 MG tablet Take 1 tablet (75 mg total) by mouth daily. 07/19/15  Yes Micki Riley, MD  Amino Acids-Protein Hydrolys (FEEDING SUPPLEMENT, PRO-STAT SUGAR FREE 64,) LIQD Take 30 mLs by mouth daily. Patient not taking: Reported on 02/12/2017 01/22/17   Nita Sells Mikhail, DO  UNABLE TO FIND Med Name: Med pass 120 mL by mouth 2 times daily    Historical Provider, MD    Family History Family History  Problem Relation Age of Onset  . Diabetes  Other   . Hypertension Other   . Cancer Other     prostate  . Heart disease Other     Social History Social History  Substance Use Topics  . Smoking status: Former Games developer  . Smokeless tobacco: Never Used     Comment: quit 15 yrs ago.    . Alcohol use No     Allergies   Codeine and Simvastatin   Review of Systems Review of Systems  Unable to perform ROS: Dementia     Physical Exam Updated Vital Signs BP (!) 144/69   Pulse (!) 101   Temp 99 F (37.2 C) (Oral)   Resp 18   Ht 5\' 2"  (1.575 m)   Wt 42.6 kg   SpO2 99%   BMI 17.19 kg/m   Physical Exam  Constitutional: She appears well-developed and well-nourished. No distress.  HENT:  Head: Normocephalic and atraumatic.  Sclera pale.  Eyes: EOM are normal. Pupils are equal, round, and reactive to light.  Neck: Normal range of motion. Neck supple.  Cardiovascular: Normal rate, regular rhythm and normal heart sounds.   Pulmonary/Chest: Effort normal and breath sounds normal. No respiratory distress.  Abdominal: Soft. Bowel sounds are normal. There is no tenderness.  Genitourinary: Rectal exam shows guaiac negative stool.  Neurological: She is alert.  But confusing to be baseline for her dementia.  Skin: There is pallor.  Nursing note and vitals reviewed.    ED Treatments / Results  Labs (all labs ordered are listed, but only abnormal results are displayed) Labs Reviewed  CBC WITH DIFFERENTIAL/PLATELET - Abnormal; Notable for the following:       Result Value   RBC 2.92 (*)    Hemoglobin 7.6 (*)    HCT 24.9 (*)    RDW 15.7 (*)    Platelets 611 (*)    All other components within normal limits  BASIC METABOLIC PANEL - Abnormal; Notable for the following:    Glucose, Bld 106 (*)    BUN 23 (*)    Creatinine, Ser 1.26 (*)    Calcium 10.5 (*)    GFR calc non Af Amer 38 (*)    GFR calc Af Amer 44 (*)    All other components within normal limits  URINE CULTURE  URINALYSIS, ROUTINE W REFLEX MICROSCOPIC    VITAMIN B12  FOLATE  IRON AND TIBC  FERRITIN  RETICULOCYTES  TSH  T4, FREE  PREALBUMIN  HEPATIC FUNCTION PANEL  TYPE AND SCREEN  ABO/RH  PREPARE RBC (CROSSMATCH)    EKG  EKG Interpretation None       Radiology No results found.  Procedures Procedures (including critical care time)  Medications Ordered in ED Medications  0.9 %  sodium chloride infusion ( Intravenous New Bag/Given 02/12/17 1100)  0.9 %  sodium chloride infusion (not administered)     Initial Impression / Assessment and Plan / ED Course  I have reviewed the triage vital signs and the nursing notes.  Pertinent  labs & imaging results that were available during my care of the patient were reviewed by me and considered in my medical decision making (see chart for details).     Patient with anemia hemoglobins below 8. Hemoglobin came in here at 7.6. But looking at her renal function she appears to be a little dry. Clinically also appears a little dry.  Patient nontoxic no acute distress. No evidence of any obvious bleed. Patient would benefit from blood transfusion. Discussed with hospitalist team they will admit.  Final Clinical Impressions(s) / ED Diagnoses   Final diagnoses:  Anemia in other chronic diseases classified elsewhere    New Prescriptions New Prescriptions   No medications on file     Vanetta MuldersScott Ia Leeb, MD 02/12/17 1607

## 2017-02-12 NOTE — ED Triage Notes (Signed)
Patient comes in per [facility] with low hgb. [facility]. Patient has hgb of 6.9, taken yesterday. On 12th came back low hgb. Hx of chronic anemia. Pt DNR. bp 110/52, 103 HR, 96%, 18, 153 fsbs.

## 2017-02-12 NOTE — H&P (Signed)
History and Physical    Adriana Middleton ZOX:[id number] DOB: 08-09-1930 DOA: 02/12/2017   PCP: Geraldo Pitter, MD   Patient coming from/Resides with: Private residence/daughter  Admission status: Observation/Floor-it may be medically necessary to stay a minimum 2 midnights to rule out impending and/or unexpected changes in physiologic status that may differ from initial evaluation performed in the ER and/or at time of admission therefore consider reevaluation of admission status 24 hours.   Chief Complaint: Symptomatic anemia  HPI: Adriana Middleton is a 83 y.o. female with medical history significant for remote history of stroke, mild dementia, GERD, dyslipidemia, COPD, stage III chronic kidney disease was recently discharged on 2/22 to skilled nursing facility after an admission for metabolic encephalopathy and acute kidney injury secondary to poor oral intake/failure to thrive. Since discharge to the nursing facility patient has had regular follow-up including lab work. Reported hemoglobin was 6.2 yesterday. Patient was sent to the ER for blood transfusion. Hemoglobin here was 7.4. Patient denies issues with red or bloody stools. No emesis. She does admit to poor oral intake especially with fluids. She states she is hungry right now.  ED Course:  Vital Signs: BP 112/65 (BP Location: Right Arm)   Pulse 97   Temp 99.9 F (37.7 C) (Rectal)   Resp 15   Ht 5\' 2"  (1.575 m)   Wt 42.6 kg (94 lb)   SpO2 99%   BMI 17.19 kg/m  Lab data: Sodium 138, potassium 3.9, chloride 15, CO2 24, glucose 16, BUN 23, creatinine 1.26, calcium 10.5, anion gap 9, white count 7500 normal differential, hemoglobin 7.6, platelet 611,000 ; type and screen obtained in ER Medications and treatments: None  Review of Systems:  In addition to the HPI above,  No Fever-chills, myalgias or other constitutional symptoms-minutes to poor oral intake primarily fluids No Headache, changes with Vision or hearing, new weakness, tingling,  numbness in any extremity, dizziness, dysarthria or word finding difficulty, gait disturbance or imbalance, tremors or seizure activity No problems swallowing food or Liquids, indigestion/reflux, choking or coughing while eating, abdominal pain with or after eating No Chest pain, Cough or Shortness of Breath, palpitations, orthopnea or DOE No Abdominal pain, N/V, melena,hematochezia, dark tarry stools, constipation No dysuria, malodorous urine, hematuria or flank pain No new skin rashes, lesions, masses or bruises, No new joint pains, aches, swelling or redness No recent unintentional weight gain or loss No polyuria, polydypsia or polyphagia   Past Medical History:  Diagnosis Date  . ARTHRITIS, LEFT HIP 04/18/2008  . CATARACTS, BILATERAL 04/18/2008  . COPD 04/18/2008  . DEPRESSION 07/08/2007  . GERD 07/08/2007  . HYPERTENSION 07/08/2007  . Stroke (HCC)   . Unspecified disorder of parathyroid gland 11/14/2010    Past Surgical History:  Procedure Laterality Date  . COLOSTOMY     partial  . EP IMPLANTABLE DEVICE N/A 06/18/2015   Procedure: Loop Recorder Insertion;  Surgeon: Marinus Maw, MD;  Location: [facility] INVASIVE CV LAB;  Service: Cardiovascular;  Laterality: N/A;  . TEE WITHOUT CARDIOVERSION N/A 06/18/2015   Procedure: TRANSESOPHAGEAL ECHOCARDIOGRAM (TEE);  Surgeon: Chrystie Nose, MD;  Location: [facility] ENDOSCOPY;  Service: Cardiovascular;  Laterality: N/A;    Social History   Social History  . Marital status: Widowed    Spouse name: N/A  . Number of children: N/A  . Years of education: N/A   Occupational History  . Not on file.   Social History Main Topics  . Smoking status: Former Games developer  . Smokeless tobacco:  Never Used     Comment: quit 15 yrs ago.    . Alcohol use No  . Drug use: No  . Sexual activity: Not on file   Other Topics Concern  . Not on file   Social History Narrative  . No narrative on file    Mobility: Required 2 person assist to mobilize from bed to  standing and from bed to mobilization; PT noted patient very slow gait and movement and skilled nursing facility was recommended per to last discharge Work history: Not obtained   Allergies  Allergen Reactions  . Codeine Itching  . Simvastatin Itching    Family History  Problem Relation Age of Onset  . Diabetes Other   . Hypertension Other   . Cancer Other     prostate  . Heart disease Other     Prior to Admission medications   Medication Sig Start Date End Date Taking? Authorizing Provider  acetaminophen (TYLENOL) 325 MG tablet Take 2 tablets (650 mg total) by mouth every 6 (six) hours as needed for mild pain, moderate pain, fever or headache (or Fever >/= 101). 09/23/15  Yes Elease Etienne, MD  amLODipine (NORVASC) 5 MG tablet Take 5 mg by mouth at bedtime.  08/01/16  Yes Historical Provider, MD  atorvastatin (LIPITOR) 20 MG tablet Take 1 tablet (20 mg total) by mouth daily. Patient taking differently: Take 10 mg by mouth daily.  07/19/15  Yes Micki Riley, MD  buPROPion (WELLBUTRIN SR) 100 MG 12 hr tablet TAKE 1 TABLET (100 MG TOTAL) BY MOUTH daily 10/13/16  Yes Micki Riley, MD  clopidogrel (PLAVIX) 75 MG tablet Take 1 tablet (75 mg total) by mouth daily. 07/19/15  Yes Micki Riley, MD  Amino Acids-Protein Hydrolys (FEEDING SUPPLEMENT, PRO-STAT SUGAR FREE 64,) LIQD Take 30 mLs by mouth daily. Patient not taking: Reported on 02/12/2017 01/22/17   Nita Sells Mikhail, DO  UNABLE TO FIND Med Name: Med pass 120 mL by mouth 2 times daily    Historical Provider, MD    Physical Exam: Vitals:   02/12/17 1145 02/12/17 1215 02/12/17 1230 02/12/17 1421  BP: 127/64 121/61 118/63 112/65  Pulse: 90 86 86 97  Resp: (!) 21 (!) 21 (!) 23 15  Temp:    99.9 F (37.7 C)  TempSrc:    Rectal  SpO2: 100% 100% 97% 99%  Weight:      Height:          Constitutional: NAD, calm, comfortable-Appears malnourished Eyes: PERRL, lids and conjunctivae normal ENMT: Mucous membranes are dry.  Posterior pharynx clear of any exudate or lesions.Normal dentition.  Neck: normal, supple, no masses, no thyromegaly Respiratory: clear to auscultation bilaterally, no wheezing, no crackles. Normal respiratory effort. No accessory muscle use.  Cardiovascular: Regular rate and rhythm, 3/6 systolic murmur-LSB/2nd ICS- no rubs / gallops. No extremity edema. 2+ pedal pulses. No carotid bruits.  Abdomen: no tenderness, no masses palpated. No hepatosplenomegaly. Bowel sounds positive.  Musculoskeletal: no clubbing / cyanosis. No joint deformity upper and lower extremities. Good ROM, no contractures. Normal muscle tone.  Skin: no rashes, lesions, ulcers. No induration Neurologic: CN 2-12 grossly intact. Sensation intact, DTR normal. Strength 5/5 x all 4 extremities.  Psychiatric: Alert and oriented to name and place, not completely oriented to situation, lacks appropriate judgment and insight into current medical status    Labs on Admission: I have personally reviewed following labs and imaging studies  CBC:  Recent Labs Lab 02/12/17 1040  WBC  7.5  NEUTROABS 5.8  HGB 7.6*  HCT 24.9*  MCV 85.3  PLT 611*   Basic Metabolic Panel:  Recent Labs Lab 02/12/17 1040  NA 138  K 3.9  CL 105  CO2 24  GLUCOSE 106*  BUN 23*  CREATININE 1.26*  CALCIUM 10.5*   GFR: Estimated Creatinine Clearance: 22.4 mL/min (A) (by C-G formula based on SCr of 1.26 mg/dL (H)). Liver Function Tests: No results for input(s): AST, ALT, ALKPHOS, BILITOT, PROT, ALBUMIN in the last 168 hours. No results for input(s): LIPASE, AMYLASE in the last 168 hours. No results for input(s): AMMONIA in the last 168 hours. Coagulation Profile: No results for input(s): INR, PROTIME in the last 168 hours. Cardiac Enzymes: No results for input(s): CKTOTAL, CKMB, CKMBINDEX, TROPONINI in the last 168 hours. BNP (last 3 results) No results for input(s): PROBNP in the last 8760 hours. HbA1C: No results for input(s): HGBA1C in the  last 72 hours. CBG: No results for input(s): GLUCAP in the last 168 hours. Lipid Profile: No results for input(s): CHOL, HDL, LDLCALC, TRIG, CHOLHDL, LDLDIRECT in the last 72 hours. Thyroid Function Tests: No results for input(s): TSH, T4TOTAL, FREET4, T3FREE, THYROIDAB in the last 72 hours. Anemia Panel: No results for input(s): VITAMINB12, FOLATE, FERRITIN, TIBC, IRON, RETICCTPCT in the last 72 hours. Urine analysis:    Component Value Date/Time   COLORURINE STRAW (A) 01/19/2017 1420   APPEARANCEUR CLEAR 01/19/2017 1420   LABSPEC 1.010 01/19/2017 1420   PHURINE 6.0 01/19/2017 1420   GLUCOSEU NEGATIVE 01/19/2017 1420   HGBUR NEGATIVE 01/19/2017 1420   HGBUR trace-lysed 11/03/2010 0959   BILIRUBINUR NEGATIVE 01/19/2017 1420   BILIRUBINUR n 12/06/2012 1131   KETONESUR NEGATIVE 01/19/2017 1420   PROTEINUR NEGATIVE 01/19/2017 1420   UROBILINOGEN 1.0 09/22/2015 0838   NITRITE NEGATIVE 01/19/2017 1420   LEUKOCYTESUR NEGATIVE 01/19/2017 1420   Sepsis Labs: @LABRCNTIP (procalcitonin:4,lacticidven:4) )No results found for this or any previous visit (from the past 240 hour(s)).   Radiological Exams on Admission: No results found.    Assessment/Plan Principal Problem:   Symptomatic anemia -Sent from skilled nursing facility secondary to abnormal hemoglobin of 6.2 obtained on 3/15 with follow-up hemoglobin here 7.6 -Patient appears clinically dehydrated with elevated calcium, BUN, creatinine as well as thrombocytosis which is concerning for likely much lower than resulted hemoglobin due to hemoconcentration -Obtain anemia panel -Transfuse 2 units packed red blood cells -obtain FOB -If no GI source for anemia then consider etiology either related to patient's poor nutritional status or evolving bone marrow suppression -Hold Plavix until establish no GI component to anemia  Active Problems:   Dehydration/FTT -Due to poor oral intake and setting of dementia -IV fluids at  50hr -Encourage oral intake -Appears to have severe protein calorie malnutrition so check pre-albumin -Evaluated by nutritional services during previous admission and was started on pro-stat which patient apparently has not taking-order protein supplementation    Acute kidney injury on CKD (chronic kidney disease), stage III -See above -Treat underlying causes -Follow labs after hydration    Abnormal transaminases -AST was 61 with ALT 65 and normal total bilirubin on 3/5 -Suspect related to malnutrition -Hepatic function panel today    Essential hypertension -Blood pressure somewhat some optimal with systolic readings in the 110 range so we'll hold preadmission Norvasc for now    COPD (chronic obstructive pulmonary disease)  -Compensated without wheezing    Abnormal TSH -During previous admission TSH was 0.253 with free T4 1.39 -Repeat TSH/free T4 today -Possibly contributing  to anemia    Dementia -Not on disease modifying medications prior to admission -Patient reports to me that she lives at her own apartment and her daughter lives with her but during previous admission she was discharged to [facility] her for patient clearly does not have insight into current status, problems and is not oriented appropriately -Continue Wellbutrin    GERD -Not on PPI or H2 blocker prior to admission -Patient does not mention swallowing difficulties but this could be source of patient's poor oral intake so may need to investigate further for her to discharge    History of CVA in adulthood -Remote CVA and has been followed in the outpatient setting eye doctor Pearlean Brownie -Apparently no longer on aspirin -Plavix on hold until source of anemia clarified -Continue Lipitor    HLD (hyperlipidemia) -Continue statin -Check lipid panel-as documented in 2016 with cholesterol 209, HDL cholesterol 52 and LDL cholesterol 138      DVT prophylaxis: SCDs Code Status: DO NOT RESUSCITATE Family  Communication: No family at bedside Disposition Plan: SNF Consults called: None    Emmilee Reamer L. ANP-BC [facility] Pager [phone]   If 7PM-7AM, please contact night-coverage [email or web address] Password [email or web address]  02/12/2017, 3:00 PM

## 2017-02-12 NOTE — Progress Notes (Signed)
Patient arrived on the floor at 1628, with blood transfusion going on thru her right upper forearm peripheral I.v. Blood transfusion was started at E.D. at 1558.

## 2017-02-12 NOTE — ED Notes (Signed)
Called and spoke with Lucila MaineGrandson Fraser Din([place]) [phone] per the patient's request.

## 2017-02-13 DIAGNOSIS — R946 Abnormal results of thyroid function studies: Secondary | ICD-10-CM | POA: Diagnosis not present

## 2017-02-13 DIAGNOSIS — R748 Abnormal levels of other serum enzymes: Secondary | ICD-10-CM

## 2017-02-13 DIAGNOSIS — N179 Acute kidney failure, unspecified: Secondary | ICD-10-CM | POA: Diagnosis not present

## 2017-02-13 DIAGNOSIS — D649 Anemia, unspecified: Secondary | ICD-10-CM | POA: Diagnosis not present

## 2017-02-13 LAB — COMPREHENSIVE METABOLIC PANEL
ALBUMIN: 2.1 g/dL — AB (ref 3.5–5.0)
ALK PHOS: 70 U/L (ref 38–126)
ALT: 32 U/L (ref 14–54)
AST: 28 U/L (ref 15–41)
Anion gap: 9 (ref 5–15)
BUN: 20 mg/dL (ref 6–20)
CALCIUM: 10.1 mg/dL (ref 8.9–10.3)
CO2: 21 mmol/L — AB (ref 22–32)
CREATININE: 1.17 mg/dL — AB (ref 0.44–1.00)
Chloride: 106 mmol/L (ref 101–111)
GFR, EST AFRICAN AMERICAN: 48 mL/min — AB (ref >60)
GFR, EST NON AFRICAN AMERICAN: 41 mL/min — AB (ref >60)
Glucose, Bld: 89 mg/dL (ref 65–99)
Potassium: 4.2 mmol/L (ref 3.5–5.1)
Sodium: 136 mmol/L (ref 135–145)
Total Bilirubin: 0.6 mg/dL (ref 0.3–1.2)
Total Protein: 6.3 g/dL — ABNORMAL LOW (ref 6.5–8.1)

## 2017-02-13 LAB — FERRITIN: FERRITIN: 96 ng/mL (ref 11–307)

## 2017-02-13 LAB — VITAMIN B12: Vitamin B-12: 312 pg/mL (ref 180–914)

## 2017-02-13 LAB — TYPE AND SCREEN
ABO/RH(D): O POS
Antibody Screen: NEGATIVE
UNIT DIVISION: 0
Unit division: 0

## 2017-02-13 LAB — BPAM RBC
Blood Product Expiration Date: 201803212359
Blood Product Expiration Date: 201804092359
ISSUE DATE / TIME: 201803161538
ISSUE DATE / TIME: 201803162005
UNIT TYPE AND RH: 5100
Unit Type and Rh: 5100

## 2017-02-13 LAB — RETICULOCYTES
RBC.: 3.7 MIL/uL — ABNORMAL LOW (ref 3.87–5.11)
RETIC COUNT ABSOLUTE: 51.8 10*3/uL (ref 19.0–186.0)
RETIC CT PCT: 1.4 % (ref 0.4–3.1)

## 2017-02-13 LAB — CBC
HCT: 30.2 % — ABNORMAL LOW (ref 36.0–46.0)
Hemoglobin: 9.9 g/dL — ABNORMAL LOW (ref 12.0–15.0)
MCH: 27.1 pg (ref 26.0–34.0)
MCHC: 32.8 g/dL (ref 30.0–36.0)
MCV: 82.7 fL (ref 78.0–100.0)
PLATELETS: 501 10*3/uL — AB (ref 150–400)
RBC: 3.65 MIL/uL — AB (ref 3.87–5.11)
RDW: 15.2 % (ref 11.5–15.5)
WBC: 8.5 10*3/uL (ref 4.0–10.5)

## 2017-02-13 LAB — URINE CULTURE

## 2017-02-13 LAB — LIPID PANEL
CHOLESTEROL: 127 mg/dL (ref 0–200)
HDL: 42 mg/dL (ref >40)
LDL Cholesterol: 75 mg/dL (ref 0–99)
Total CHOL/HDL Ratio: 3 RATIO
Triglycerides: 52 mg/dL (ref <150)
VLDL: 10 mg/dL (ref 0–40)

## 2017-02-13 LAB — HEPATIC FUNCTION PANEL
ALBUMIN: 2.1 g/dL — AB (ref 3.5–5.0)
ALT: 32 U/L (ref 14–54)
AST: 27 U/L (ref 15–41)
Alkaline Phosphatase: 70 U/L (ref 38–126)
BILIRUBIN TOTAL: 0.7 mg/dL (ref 0.3–1.2)
Bilirubin, Direct: 0.1 mg/dL (ref 0.1–0.5)
Indirect Bilirubin: 0.6 mg/dL (ref 0.3–0.9)
Total Protein: 6.4 g/dL — ABNORMAL LOW (ref 6.5–8.1)

## 2017-02-13 LAB — IRON AND TIBC
Iron: 25 ug/dL — ABNORMAL LOW (ref 28–170)
Saturation Ratios: 11 % (ref 10.4–31.8)
TIBC: 221 ug/dL — ABNORMAL LOW (ref 250–450)
UIBC: 196 ug/dL

## 2017-02-13 LAB — PREALBUMIN: Prealbumin: 10.1 mg/dL — ABNORMAL LOW (ref 18–38)

## 2017-02-13 LAB — T4, FREE: Free T4: 0.99 ng/dL (ref 0.61–1.12)

## 2017-02-13 LAB — MRSA PCR SCREENING: MRSA by PCR: NEGATIVE

## 2017-02-13 LAB — FOLATE: Folate: 20.3 ng/mL (ref >5.9)

## 2017-02-13 LAB — TSH: TSH: 1.009 u[IU]/mL (ref 0.350–4.500)

## 2017-02-13 MED ORDER — PANTOPRAZOLE SODIUM 40 MG PO TBEC
40.0000 mg | DELAYED_RELEASE_TABLET | Freq: Every day | ORAL | Status: DC
  Administered 2017-02-13 – 2017-02-14 (×2): 40 mg via ORAL
  Filled 2017-02-13 (×2): qty 1

## 2017-02-13 MED ORDER — HALOPERIDOL LACTATE 5 MG/ML IJ SOLN
2.0000 mg | Freq: Once | INTRAMUSCULAR | Status: AC
Start: 2017-02-13 — End: 2017-02-13
  Administered 2017-02-13: 2 mg via INTRAVENOUS
  Filled 2017-02-13: qty 1

## 2017-02-13 NOTE — Progress Notes (Signed)
[facility] PROGRESS NOTE  Adriana Middleton ZOX:[id number] DOB: 09-Oct-1930 DOA: 02/12/2017   PCP: Geraldo Pitter, MD     Assessment/Plan: Principal Problem:   Symptomatic anemia Active Problems:   Essential hypertension   COPD (chronic obstructive pulmonary disease) (HCC)   GERD   History of CVA in adulthood   HLD (hyperlipidemia)   Dehydration   Acute kidney injury (HCC)   Abnormal TSH   Dementia   CKD (chronic kidney disease), stage III   Abnormal transaminases   83 y.o. female with medical history significant for remote history of stroke, mild dementia, GERD, dyslipidemia, COPD, stage III chronic kidney disease was recently discharged on 2/22 to skilled nursing facility after an admission for metabolic encephalopathy and acute kidney injury secondary to poor oral intake/failure to thrive. Since discharge to the nursing facility patient has had regular follow-up including lab work. Reported hemoglobin was 6.2 yesterday. Patient was sent to the ER for blood transfusion. Hemoglobin here was 7.4. Patient denies issues with red or bloody stools. No emesis  Assessment and plan Symptomatic anemia -Sent from skilled nursing facility secondary to abnormal hemoglobin of 6.2 obtained on 3/15 with follow-up hemoglobin here 7.6 -Patient appears clinically dehydrated with elevated calcium, BUN, creatinine as well as thrombocytosis which is concerning for likely much lower than resulted hemoglobin due to hemoconcentration Anemia panel consistent with anemia of chronic disease Status post 2 units of packed red blood cell, repeat hemoglobin 9.9 Check FOBT, started PPI as the patient is on Plavix -If no GI source for anemia then consider etiology either related to patient's poor nutritional status or evolving bone marrow suppression Hold Plavix for now and repeat CBC tomorrow     Dehydration/FTT -Due to poor oral intake and setting of dementia -IV fluids at 50hr -Encourage oral  intake -Appears to have severe protein calorie malnutrition so check pre-albumin -Evaluated by nutritional services during previous admission and was started on pro-stat which patient apparently has not taking-order protein supplementation    Acute kidney injury on CKD (chronic kidney disease), stage III Creatinine 1.26, now 1.17 -Treat underlying causes -Follow labs after hydration    Abnormal transaminases, resolved -AST was 61 with ALT 65 and normal total bilirubin on 3/5, transaminitis has resolved      Essential hypertension -Blood pressure somewhat some optimal with systolic readings in the 110 range so we'll hold preadmission Norvasc for now    COPD (chronic obstructive pulmonary disease)  -Compensated without wheezing    Abnormal TSH -During previous admission TSH was 0.253 with free T4 1.39 Repeat normal    Dementia -Not on disease modifying medications prior to admission -Patient reports to me that she lives at her own apartment and her daughter lives with her but during previous admission she was discharged to [facility] her for patient clearly does not have insight into current status, problems and is not oriented appropriately -Continue Wellbutrin    GERD -Not on PPI or H2 blocker prior to admission -Patient does not mention swallowing difficulties but this could be source of patient's poor oral intake so may need to investigate further for her to discharge    History of CVA  -Remote CVA, followed by Pearlean Brownie -Apparently no longer on aspirin -Plavix on hold until source of anemia clarified -Continue Lipitor    HLD (hyperlipidemia) -Continue statin -Check lipid panel-as documented in 2016 with cholesterol 209, HDL cholesterol 52 and LDL cholesterol 138     DVT prophylaxsis SCDs  Code Status:  DNR    Family Communication: Discussed in detail with the patient, all imaging results, lab results explained to the patient   Disposition Plan:   SNF on  Monday    Consultants:  None  Procedures:  None  Antibiotics: Anti-infectives    None         HPI/Subjective: Low grade , fever, no cough , no cp, no sob   Objective: Vitals:   02/12/17 2010 02/12/17 2033 02/12/17 2318 02/13/17 0654  BP: 136/63 140/60 126/67 134/63  Pulse: 92 97 (!) 110 (!) 101  Resp: 18 19 18 19   Temp: 100 F (37.8 C) (!) 100.6 F (38.1 C) 98.5 F (36.9 C) 99.5 F (37.5 C)  TempSrc: Oral Oral Oral Oral  SpO2: 100% 99% 99% 99%  Weight:   42.4 kg (93 lb 7.6 oz)   Height:        Intake/Output Summary (Last 24 hours) at 02/13/17 0906 Last data filed at 02/13/17 [id number]  Gross per 24 hour  Intake             1790 ml  Output              100 ml  Net             1690 ml    Exam:  Examination:  General exam: Appears calm and comfortable  Respiratory system: Clear to auscultation. Respiratory effort normal. Cardiovascular system: S1 & S2 heard, RRR. No JVD, murmurs, rubs, gallops or clicks. No pedal edema. Gastrointestinal system: Abdomen is nondistended, soft and nontender. No organomegaly or masses felt. Normal bowel sounds heard. Central nervous system: Alert and oriented. No focal neurological deficits. Extremities: Symmetric 5 x 5 power. Skin: No rashes, lesions or ulcers Psychiatry: Judgement and insight appear normal. Mood & affect appropriate.     Data Reviewed: I have personally reviewed following labs and imaging studies  Micro Results No results found for this or any previous visit (from the past 240 hour(s)).  Radiology Reports No results found.   CBC  Recent Labs Lab 02/12/17 1040 02/13/17 0205  WBC 7.5 8.5  HGB 7.6* 9.9*  HCT 24.9* 30.2*  PLT 611* 501*  MCV 85.3 82.7  MCH 26.0 27.1  MCHC 30.5 32.8  RDW 15.7* 15.2  LYMPHSABS 0.7  --   MONOABS 0.9  --   EOSABS 0.1  --   BASOSABS 0.0  --     Chemistries   Recent Labs Lab 02/12/17 1040 02/13/17 0205  NA 138 136  K 3.9 4.2  CL 105 106  CO2 24 21*   GLUCOSE 106* 89  BUN 23* 20  CREATININE 1.26* 1.17*  CALCIUM 10.5* 10.1  AST  --  27  28  ALT  --  32  32  ALKPHOS  --  70  70  BILITOT  --  0.7  0.6   ------------------------------------------------------------------------------------------------------------------ estimated creatinine clearance is 23.5 mL/min (A) (by C-G formula based on SCr of 1.17 mg/dL (H)). ------------------------------------------------------------------------------------------------------------------ No results for input(s): HGBA1C in the last 72 hours. ------------------------------------------------------------------------------------------------------------------  Recent Labs  02/13/17 0205  CHOL 127  HDL 42  LDLCALC 75  TRIG 52  CHOLHDL 3.0   ------------------------------------------------------------------------------------------------------------------  Recent Labs  02/13/17 0205  TSH 1.009   ------------------------------------------------------------------------------------------------------------------  Recent Labs  02/13/17 0205  VITAMINB12 312  FOLATE 20.3  FERRITIN 96  TIBC 221*  IRON 25*  RETICCTPCT 1.4    Coagulation profile No results for input(s): INR, PROTIME in the  last 168 hours.  No results for input(s): DDIMER in the last 72 hours.  Cardiac Enzymes No results for input(s): CKMB, TROPONINI, MYOGLOBIN in the last 168 hours.  Invalid input(s): CK ------------------------------------------------------------------------------------------------------------------ Invalid input(s): POCBNP   CBG: No results for input(s): GLUCAP in the last 168 hours.     Studies: No results found.    Lab Results  Component Value Date   HGBA1C 5.6 06/17/2015   Lab Results  Component Value Date   LDLCALC 75 02/13/2017   CREATININE 1.17 (H) 02/13/2017       Scheduled Meds: . atorvastatin  10 mg Oral Daily  . buPROPion  100 mg Oral BID  . feeding supplement  1  Container Oral TID BM  . pantoprazole  40 mg Oral Daily   Continuous Infusions: . sodium chloride 50 mL/hr at 02/12/17 2319     LOS: 0 days    Time spent: >30 MINS    [facility]  [facility] Pager [phone]. If 7PM-7AM, please contact night-coverage at [email or web address], password [facility] 02/13/2017, 9:06 AM  LOS: 0 days

## 2017-02-14 DIAGNOSIS — L8990 Pressure ulcer of unspecified site, unspecified stage: Secondary | ICD-10-CM | POA: Insufficient documentation

## 2017-02-14 DIAGNOSIS — R946 Abnormal results of thyroid function studies: Secondary | ICD-10-CM | POA: Diagnosis not present

## 2017-02-14 DIAGNOSIS — R748 Abnormal levels of other serum enzymes: Secondary | ICD-10-CM | POA: Diagnosis not present

## 2017-02-14 DIAGNOSIS — D649 Anemia, unspecified: Secondary | ICD-10-CM | POA: Diagnosis not present

## 2017-02-14 LAB — CBC
HEMATOCRIT: 29.1 % — AB (ref 36.0–46.0)
Hemoglobin: 9.3 g/dL — ABNORMAL LOW (ref 12.0–15.0)
MCH: 26.6 pg (ref 26.0–34.0)
MCHC: 32 g/dL (ref 30.0–36.0)
MCV: 83.1 fL (ref 78.0–100.0)
PLATELETS: 456 10*3/uL — AB (ref 150–400)
RBC: 3.5 MIL/uL — ABNORMAL LOW (ref 3.87–5.11)
RDW: 15.4 % (ref 11.5–15.5)
WBC: 8.6 10*3/uL (ref 4.0–10.5)

## 2017-02-14 MED ORDER — PANTOPRAZOLE SODIUM 40 MG PO TBEC: 40.0000 mg | DELAYED_RELEASE_TABLET | Freq: Every day | ORAL | 1 refills | Status: AC

## 2017-02-14 NOTE — Discharge Summary (Signed)
Physician Discharge Summary  Adriana Middleton MRN: [id number] DOB/AGE: 02/03/1930 83 y.o.  PCP: Elyn Peers, MD   Admit date: 02/12/2017 Discharge date: 02/14/2017  Discharge Diagnoses:    Principal Problem:   Symptomatic anemia Active Problems:   Essential hypertension   COPD (chronic obstructive pulmonary disease) (HCC)   GERD   History of CVA in adulthood   HLD (hyperlipidemia)   Dehydration   Acute kidney injury ([place])   Abnormal TSH   Dementia   CKD (chronic kidney disease), stage III   Abnormal transaminases    Follow-up recommendations Follow-up with PCP in 3-5 days , including all  additional recommended appointments as below Follow-up CBC, CMP in 3-5 days Recommend palliative care evaluation at SNF      Current Discharge Medication List    START taking these medications   Details  pantoprazole (PROTONIX) 40 MG tablet Take 1 tablet (40 mg total) by mouth daily. Qty: 30 tablet, Refills: 1      CONTINUE these medications which have NOT CHANGED   Details  acetaminophen (TYLENOL) 325 MG tablet Take 2 tablets (650 mg total) by mouth every 6 (six) hours as needed for mild pain, moderate pain, fever or headache (or Fever >/= 101).    atorvastatin (LIPITOR) 20 MG tablet Take 1 tablet (20 mg total) by mouth daily.    buPROPion (WELLBUTRIN SR) 100 MG 12 hr tablet TAKE 1 TABLET (100 MG TOTAL) BY MOUTH daily Qty: 90 tablet, Refills: 3    clopidogrel (PLAVIX) 75 MG tablet Take 1 tablet (75 mg total) by mouth daily.    Amino Acids-Protein Hydrolys (FEEDING SUPPLEMENT, PRO-STAT SUGAR FREE 64,) LIQD Take 30 mLs by mouth daily. Qty: 900 mL, Refills: 0    UNABLE TO FIND Med Name: Med pass 120 mL by mouth 2 times daily      STOP taking these medications     amLODipine (NORVASC) 5 MG tablet          Discharge Condition: Stable   Discharge Instructions Get Medicines reviewed and adjusted: Please take all your medications with you for your next visit with your  Primary MD  Please request your Primary MD to go over all hospital tests and procedure/radiological results at the follow up, please ask your Primary MD to get all Hospital records sent to his/her office.  If you experience worsening of your admission symptoms, develop shortness of breath, life threatening emergency, suicidal or homicidal thoughts you must seek medical attention immediately by calling 911 or calling your MD immediately if symptoms less severe.  You must read complete instructions/literature along with all the possible adverse reactions/side effects for all the Medicines you take and that have been prescribed to you. Take any new Medicines after you have completely understood and accpet all the possible adverse reactions/side effects.   Do not drive when taking Pain medications.   Do not take more than prescribed Pain, Sleep and Anxiety Medications  Special Instructions: If you have smoked or chewed Tobacco in the last 2 yrs please stop smoking, stop any regular Alcohol and or any Recreational drug use.  Wear Seat belts while driving.  Please note  You were cared for by a hospitalist during your hospital stay. Once you are discharged, your primary care physician will handle any further medical issues. Please note that NO REFILLS for any discharge medications will be authorized once you are discharged, as it is imperative that you return to your primary care physician (or establish a relationship with  a primary care physician if you do not have one) for your aftercare needs so that they can reassess your need for medications and monitor your lab values.  Discharge Instructions    Diet - low sodium heart healthy    Complete by:  As directed    Increase activity slowly    Complete by:  As directed        Allergies  Allergen Reactions  . Codeine Itching  . Simvastatin Itching      Disposition: 03-Skilled Nursing Facility   Consults: None*        Filed  Weights   02/12/17 1018 02/12/17 2318 02/13/17 2011  Weight: 42.6 kg (94 lb) 42.4 kg (93 lb 7.6 oz) 45.8 kg (101 lb)     Microbiology: Recent Results (from the past 240 hour(s))  Culture, Urine     Status: Abnormal   Collection Time: 02/12/17  2:28 PM  Result Value Ref Range Status   Specimen Description URINE, RANDOM  Final   Special Requests NONE  Final   Culture MULTIPLE SPECIES PRESENT, SUGGEST RECOLLECTION (A)  Final   Report Status 02/13/2017 FINAL  Final  MRSA PCR Screening     Status: None   Collection Time: 02/13/17  8:12 AM  Result Value Ref Range Status   MRSA by PCR NEGATIVE NEGATIVE Final    Comment:        The GeneXpert MRSA Assay (FDA approved for NASAL specimens only), is one component of a comprehensive MRSA colonization surveillance program. It is not intended to diagnose MRSA infection nor to guide or monitor treatment for MRSA infections.        Blood Culture    Component Value Date/Time   SDES URINE, RANDOM 02/12/2017 1428   SPECREQUEST NONE 02/12/2017 1428   CULT MULTIPLE SPECIES PRESENT, SUGGEST RECOLLECTION (A) 02/12/2017 1428   REPTSTATUS 02/13/2017 FINAL 02/12/2017 1428      Labs: Results for orders placed or performed during the hospital encounter of 02/12/17 (from the past 48 hour(s))  CBC with Differential/Platelet     Status: Abnormal   Collection Time: 02/12/17 10:40 AM  Result Value Ref Range   WBC 7.5 4.0 - 10.5 K/uL   RBC 2.92 (L) 3.87 - 5.11 MIL/uL   Hemoglobin 7.6 (L) 12.0 - 15.0 g/dL   HCT 24.9 (L) 36.0 - 46.0 %   MCV 85.3 78.0 - 100.0 fL   MCH 26.0 26.0 - 34.0 pg   MCHC 30.5 30.0 - 36.0 g/dL   RDW 15.7 (H) 11.5 - 15.5 %   Platelets 611 (H) 150 - 400 K/uL   Neutrophils Relative % 77 %   Neutro Abs 5.8 1.7 - 7.7 K/uL   Lymphocytes Relative 9 %   Lymphs Abs 0.7 0.7 - 4.0 K/uL   Monocytes Relative 12 %   Monocytes Absolute 0.9 0.1 - 1.0 K/uL   Eosinophils Relative 2 %   Eosinophils Absolute 0.1 0.0 - 0.7 K/uL    Basophils Relative 0 %   Basophils Absolute 0.0 0.0 - 0.1 K/uL  Basic metabolic panel     Status: Abnormal   Collection Time: 02/12/17 10:40 AM  Result Value Ref Range   Sodium 138 135 - 145 mmol/L   Potassium 3.9 3.5 - 5.1 mmol/L   Chloride 105 101 - 111 mmol/L   CO2 24 22 - 32 mmol/L   Glucose, Bld 106 (H) 65 - 99 mg/dL   BUN 23 (H) 6 - 20 mg/dL   Creatinine, Ser 1.26 (  H) 0.44 - 1.00 mg/dL   Calcium 10.5 (H) 8.9 - 10.3 mg/dL   GFR calc non Af Amer 38 (L) >60 mL/min   GFR calc Af Amer 44 (L) >60 mL/min    Comment: (NOTE) The eGFR has been calculated using the CKD EPI equation. This calculation has not been validated in all clinical situations. eGFR's persistently <60 mL/min signify possible Chronic Kidney Disease.    Anion gap 9 5 - 15  ABO/Rh     Status: None   Collection Time: 02/12/17 10:40 AM  Result Value Ref Range   ABO/RH(D) O POS   Type and screen [place]     Status: None   Collection Time: 02/12/17 10:50 AM  Result Value Ref Range   ABO/RH(D) O POS    Antibody Screen NEG    Sample Expiration 02/15/2017    Unit Number [id number]    Blood Component Type RED CELLS,LR    Unit division 00    Status of Unit ISSUED,FINAL    Transfusion Status OK TO TRANSFUSE    Crossmatch Result Compatible    Unit Number [id number]    Blood Component Type RED CELLS,LR    Unit division 00    Status of Unit ISSUED,FINAL    Transfusion Status OK TO TRANSFUSE    Crossmatch Result Compatible   Culture, Urine     Status: Abnormal   Collection Time: 02/12/17  2:28 PM  Result Value Ref Range   Specimen Description URINE, RANDOM    Special Requests NONE    Culture MULTIPLE SPECIES PRESENT, SUGGEST RECOLLECTION (A)    Report Status 02/13/2017 FINAL   Urinalysis, Routine w reflex microscopic     Status: None   Collection Time: 02/12/17  2:28 PM  Result Value Ref Range   Color, Urine YELLOW YELLOW   APPearance CLEAR CLEAR   Specific Gravity, Urine 1.012 1.005  - 1.030   pH 6.0 5.0 - 8.0   Glucose, UA NEGATIVE NEGATIVE mg/dL   Hgb urine dipstick NEGATIVE NEGATIVE   Bilirubin Urine NEGATIVE NEGATIVE   Ketones, ur NEGATIVE NEGATIVE mg/dL   Protein, ur NEGATIVE NEGATIVE mg/dL   Nitrite NEGATIVE NEGATIVE   Leukocytes, UA NEGATIVE NEGATIVE  Prepare RBC     Status: None   Collection Time: 02/12/17  2:46 PM  Result Value Ref Range   Order Confirmation ORDER PROCESSED BY BLOOD BANK   Vitamin B12     Status: None   Collection Time: 02/13/17  2:05 AM  Result Value Ref Range   Vitamin B-12 312 180 - 914 pg/mL    Comment: (NOTE) This assay is not validated for testing neonatal or myeloproliferative syndrome specimens for Vitamin B12 levels.   Folate     Status: None   Collection Time: 02/13/17  2:05 AM  Result Value Ref Range   Folate 20.3 >5.9 ng/mL  Iron and TIBC     Status: Abnormal   Collection Time: 02/13/17  2:05 AM  Result Value Ref Range   Iron 25 (L) 28 - 170 ug/dL   TIBC 221 (L) 250 - 450 ug/dL   Saturation Ratios 11 10.4 - 31.8 %   UIBC 196 ug/dL  Ferritin     Status: None   Collection Time: 02/13/17  2:05 AM  Result Value Ref Range   Ferritin 96 11 - 307 ng/mL  Reticulocytes     Status: Abnormal   Collection Time: 02/13/17  2:05 AM  Result Value Ref Range   Retic  Ct Pct 1.4 0.4 - 3.1 %   RBC. 3.70 (L) 3.87 - 5.11 MIL/uL   Retic Count, Manual 51.8 19.0 - 186.0 K/uL  TSH     Status: None   Collection Time: 02/13/17  2:05 AM  Result Value Ref Range   TSH 1.009 0.350 - 4.500 uIU/mL    Comment: Performed by a 3rd Generation assay with a functional sensitivity of <=0.01 uIU/mL.  T4, free     Status: None   Collection Time: 02/13/17  2:05 AM  Result Value Ref Range   Free T4 0.99 0.61 - 1.12 ng/dL    Comment: (NOTE) Biotin ingestion may interfere with free T4 tests. If the results are inconsistent with the TSH level, previous test results, or the clinical presentation, then consider biotin interference. If needed, order  repeat testing after stopping biotin.   Prealbumin     Status: Abnormal   Collection Time: 02/13/17  2:05 AM  Result Value Ref Range   Prealbumin 10.1 (L) 18 - 38 mg/dL  Hepatic function panel     Status: Abnormal   Collection Time: 02/13/17  2:05 AM  Result Value Ref Range   Total Protein 6.4 (L) 6.5 - 8.1 g/dL   Albumin 2.1 (L) 3.5 - 5.0 g/dL   AST 27 15 - 41 U/L   ALT 32 14 - 54 U/L   Alkaline Phosphatase 70 38 - 126 U/L   Total Bilirubin 0.7 0.3 - 1.2 mg/dL   Bilirubin, Direct 0.1 0.1 - 0.5 mg/dL   Indirect Bilirubin 0.6 0.3 - 0.9 mg/dL  Comprehensive metabolic panel     Status: Abnormal   Collection Time: 02/13/17  2:05 AM  Result Value Ref Range   Sodium 136 135 - 145 mmol/L   Potassium 4.2 3.5 - 5.1 mmol/L   Chloride 106 101 - 111 mmol/L   CO2 21 (L) 22 - 32 mmol/L   Glucose, Bld 89 65 - 99 mg/dL   BUN 20 6 - 20 mg/dL   Creatinine, Ser 1.17 (H) 0.44 - 1.00 mg/dL   Calcium 10.1 8.9 - 10.3 mg/dL   Total Protein 6.3 (L) 6.5 - 8.1 g/dL   Albumin 2.1 (L) 3.5 - 5.0 g/dL   AST 28 15 - 41 U/L   ALT 32 14 - 54 U/L   Alkaline Phosphatase 70 38 - 126 U/L   Total Bilirubin 0.6 0.3 - 1.2 mg/dL   GFR calc non Af Amer 41 (L) >60 mL/min   GFR calc Af Amer 48 (L) >60 mL/min    Comment: (NOTE) The eGFR has been calculated using the CKD EPI equation. This calculation has not been validated in all clinical situations. eGFR's persistently <60 mL/min signify possible Chronic Kidney Disease.    Anion gap 9 5 - 15  CBC     Status: Abnormal   Collection Time: 02/13/17  2:05 AM  Result Value Ref Range   WBC 8.5 4.0 - 10.5 K/uL   RBC 3.65 (L) 3.87 - 5.11 MIL/uL   Hemoglobin 9.9 (L) 12.0 - 15.0 g/dL    Comment: POST TRANSFUSION SPECIMEN   HCT 30.2 (L) 36.0 - 46.0 %   MCV 82.7 78.0 - 100.0 fL   MCH 27.1 26.0 - 34.0 pg   MCHC 32.8 30.0 - 36.0 g/dL   RDW 15.2 11.5 - 15.5 %   Platelets 501 (H) 150 - 400 K/uL  Lipid panel     Status: None   Collection Time: 02/13/17  2:05 AM  Result  Value Ref Range   Cholesterol 127 0 - 200 mg/dL   Triglycerides 52 <150 mg/dL   HDL 42 >40 mg/dL   Total CHOL/HDL Ratio 3.0 RATIO   VLDL 10 0 - 40 mg/dL   LDL Cholesterol 75 0 - 99 mg/dL    Comment:        Total Cholesterol/HDL:CHD Risk Coronary Heart Disease Risk Table                     Men   Women  1/2 Average Risk   3.4   3.3  Average Risk       5.0   4.4  2 X Average Risk   9.6   7.1  3 X Average Risk  23.4   11.0        Use the calculated Patient Ratio above and the CHD Risk Table to determine the patient's CHD Risk.        ATP III CLASSIFICATION (LDL):  <100     mg/dL   Optimal  100-129  mg/dL   Near or Above                    Optimal  130-159  mg/dL   Borderline  160-189  mg/dL   High  >190     mg/dL   Very High   MRSA PCR Screening     Status: None   Collection Time: 02/13/17  8:12 AM  Result Value Ref Range   MRSA by PCR NEGATIVE NEGATIVE    Comment:        The GeneXpert MRSA Assay (FDA approved for NASAL specimens only), is one component of a comprehensive MRSA colonization surveillance program. It is not intended to diagnose MRSA infection nor to guide or monitor treatment for MRSA infections.   CBC     Status: Abnormal   Collection Time: 02/14/17  6:53 AM  Result Value Ref Range   WBC 8.6 4.0 - 10.5 K/uL   RBC 3.50 (L) 3.87 - 5.11 MIL/uL   Hemoglobin 9.3 (L) 12.0 - 15.0 g/dL   HCT 29.1 (L) 36.0 - 46.0 %   MCV 83.1 78.0 - 100.0 fL   MCH 26.6 26.0 - 34.0 pg   MCHC 32.0 30.0 - 36.0 g/dL   RDW 15.4 11.5 - 15.5 %   Platelets 456 (H) 150 - 400 K/uL     Lipid Panel     Component Value Date/Time   CHOL 127 02/13/2017 0205   TRIG 52 02/13/2017 0205   HDL 42 02/13/2017 0205   CHOLHDL 3.0 02/13/2017 0205   VLDL 10 02/13/2017 0205   LDLCALC 75 02/13/2017 0205   LDLDIRECT 173.0 04/11/2007 1053     Lab Results  Component Value Date   HGBA1C 5.6 06/17/2015      HPI :  83 y.o. female with medical history significant for remote history of  stroke, mild dementia, GERD, dyslipidemia, COPD, stage III chronic kidney disease was recently discharged on 2/22 to skilled nursing facility after an admission for metabolic encephalopathy and acute kidney injury secondary to poor oral intake/failure to thrive. Since discharge to the nursing facility patient has had regular follow-up including lab work. Reported hemoglobin was 6.2 yesterday. Patient was sent to the ER for blood transfusion. Hemoglobin here was 7.4. Patient denies issues with red or bloody stools. No emesis. She does admit to poor oral intake   ED Course:  Vital Signs: BP 112/65 (BP Location: Right  Arm)   Pulse 97   Temp 99.9 F (37.7 C) (Rectal)   Resp 15   Ht 5' 2"  (1.575 m)   Wt 42.6 kg (94 lb)   SpO2 99%   BMI 17.19 kg/m  Lab data: Sodium 138, potassium 3.9, chloride 15, CO2 24, glucose 16, BUN 23, creatinine 1.26, calcium 10.5, anion gap 9, white count 7500 normal differential, hemoglobin 7.6, platelet 611,000 ; type and screen obtained in [place]:   Symptomatic anemia -Sent from skilled nursing facility secondary to abnormal hemoglobin of 6.2 obtained on 3/15 with follow-up hemoglobin here 7.6 -Patient appears clinically dehydrated with elevated calcium, BUN, creatinine as well as thrombocytosis which is concerning for likely much lower than resultedhemoglobin due to hemoconcentration Anemia panel consistent with anemia of chronic disease Status post 2 units of packed red blood cell, repeat hemoglobin 9.9  FOBT pending thus far, started PPI as the patient is on Plavix No obvious source of GI bleeding, consider outpatient hematology evaluation for anemia, may need a bone marrow biopsy Plavix resumed, due to no obvious source of bleeding    Dehydration/FTT -Due to poor oral intake and setting of dementia -Encouraged oral intake -Appears to have severe protein calorie malnutrition so check pre-albumin -Evaluated by nutritional services during  previous admission and was started on pro-stat which patient apparently has not taking-order protein supplementation   Acute kidney injury on CKD (chronic kidney disease), stage III Creatinine 1.26, now 1.17 -Treat underlying causes -Follow labs after hydration  Abnormal transaminases, resolved -AST was 61 with ALT 65 and normal total bilirubin on 3/5, transaminitis has resolved    Essential hypertension -Blood pressure somewhat some optimal with systolic readings in the [id number] so we'll hold preadmission Norvasc for now  COPD (chronic obstructive pulmonary disease)  -Compensated without wheezing  Abnormal TSH -During previous admission TSH was 0.253 with free T4 1.39 Repeat thyroid function tests normal  Dementia -Not on disease modifying medications prior to admission -Continue Wellbutrin  GERD -Not on PPI or H2 blocker prior to admission Patient started on PPI and she is also on Plavix  History of CVA  -Remote CVA, followed by [facility] -Apparently no longer on aspirin  Continue Plavix along with a PPI -Continue Lipitor  HLD (hyperlipidemia) -Continue statin        Discharge Exam:   Blood pressure 140/63, pulse 81, temperature 98.9 F (37.2 C), temperature source Oral, resp. rate 18, height 5' 2"  (1.575 m), weight 45.8 kg (101 lb), SpO2 100 %.   General exam: Appears calm and comfortable  Respiratory system: Clear to auscultation. Respiratory effort normal. Cardiovascular system: S1 & S2 heard, RRR. No JVD, murmurs, rubs, gallops or clicks. No pedal edema. Gastrointestinal system: Abdomen is nondistended, soft and nontender. No organomegaly or masses felt. Normal bowel sounds heard. Central nervous system: Alert and oriented. No focal neurological deficits. Extremities: Symmetric 5 x 5 power. Skin: No rashes, lesions or ulcers Psychiatry: Judgement and insight appear normal. Mood & affect appropriate.    Follow-up Information     Elyn Peers, MD. Call.   Specialty:  Family Medicine Why:  Hospital follow-up in 3-5 days Contact information: [address] [place] [address] [phone]           Signed: Reyne Dumas 02/14/2017, 10:12 AM        Time spent >45 mins

## 2017-02-14 NOTE — Clinical Social Work Note (Signed)
Clinical Social Worker facilitated patient discharge including contacting patient family and facility [facility] to confirm patient discharge plans. Clinical information faxed to facility and family agreeable with plan.  CSW arranged ambulance transport via [facility] to [facility]. RN to call report [phone] prior to discharge.  Clinical Social Worker will sign off for now as social work intervention is no longer needed. Please consult us again if new need arises.  Kaidan Spengler B. Gean QuintBrown,MSW, LCSWA Clinical Social Work Dept Weekend Social Worker [phone] 12:25 PM

## 2017-02-14 NOTE — Clinical Social Work Placement (Signed)
   CLINICAL SOCIAL WORK PLACEMENT  NOTE  Date:  02/14/2017  Patient Details  Name: Adriana Middleton MRN: [id number] Date of Birth: 1930-02-27  Clinical Social Work is seeking post-discharge placement for this patient at the Skilled  Nursing Facility level of care (*CSW will initial, date and re-position this form in  chart as items are completed):  Yes   Patient/family provided with [facility] Clinical Social Work Department's list of facilities offering this level of care within the geographic area requested by the patient (or if unable, by the patient's family).  Yes   Patient/family informed of their freedom to choose among providers that offer the needed level of care, that participate in Medicare, Medicaid or managed care program needed by the patient, have an available bed and are willing to accept the patient.  Yes   Patient/family informed of [facility]'s ownership interest in [facility] and [facility], as well as of the fact that they are under no obligation to receive care at these facilities.  PASRR submitted to EDS on       PASRR number received on       Existing PASRR number confirmed on 02/14/17     FL2 transmitted to all facilities in geographic area requested by pt/family on       FL2 transmitted to all facilities within larger geographic area on       Patient informed that his/her managed care company has contracts with or will negotiate with certain facilities, including the following:        Yes   Patient/family informed of bed offers received.  Patient chooses bed at  (From [facility])     Physician recommends and patient chooses bed at      Patient to be transferred to  [facility] on 02/14/17.  Patient to be transferred to facility by  Sh[facility] Mons([facility])     Patient family notified on 02/14/17 of transfer.  Name of family member notified:  DTR     PHYSICIAN Please sign FL2     Additional Comment:     _______________________________________________ Norlene DuelBROWN, Daniell Paradise B, LCSWA 02/14/2017, 12:27 PM

## 2017-02-14 NOTE — [place] FL2 (Signed)
[place] MEDICAID FL2 LEVEL OF CARE SCREENING TOOL     IDENTIFICATION  Patient Name: Adriana Middleton Birthdate: 1930-01-13 Sex: female Admission Date (Current Location): 02/12/2017  [facility] [place]   Facility and Address:  [facility], [address], [place], [place] [address]      Provider Number: [id number]  Attending Physician Name and Address:  Richarda OverlieNayana Abrol, MD  Relative Name and Phone Number:  [phone]    Current Level of Care: Hospital Recommended Level of Care: Skilled Nursing Facility Prior Approval Number:    Date Approved/Denied:   PASRR Number: [id number]  Discharge Plan: SNF    Current Diagnoses: Patient Active Problem List   Diagnosis Date Noted  . Pressure injury of skin 02/14/2017  . Symptomatic anemia 02/12/2017  . Acute kidney injury (HCC) 02/12/2017  . Abnormal TSH 02/12/2017  . Dementia 02/12/2017  . CKD (chronic kidney disease), stage III 02/12/2017  . Abnormal transaminases 02/12/2017  . Dehydration 01/19/2017  . Renal failure (ARF), acute on chronic (HCC)   . Troponin level elevated 09/22/2015  . Acute renal failure (ARF) (HCC) 09/22/2015  . Hypercalcemia 09/22/2015  . Acute encephalopathy 09/22/2015  . DNR (do not resuscitate) 09/22/2015  . Palliative care status 09/22/2015  . Hypokalemia 09/22/2015  . Loss of weight 09/22/2015  . Embolic stroke (HCC) 07/19/2015  . Aortic atherosclerosis (HCC)   . Carotid stenosis   . History of CVA in adulthood   . HLD (hyperlipidemia)   . Left-sided weakness 06/16/2015  . Acute right arterial ischemic stroke, MCA (middle cerebral artery) (HCC) 06/16/2015  . Facial weakness 06/10/2015  . TIA (transient ischemic attack) 06/10/2015  . UNSPECIFIED DISORDER OF PARATHYROID GLAND 11/14/2010  . CATARACTS, BILATERAL 04/18/2008  . COPD (chronic obstructive pulmonary disease) (HCC) 04/18/2008  . ARTHRITIS, LEFT HIP 04/18/2008  . DEPRESSION 07/08/2007   . Essential hypertension 07/08/2007  . GERD 07/08/2007    Orientation RESPIRATION BLADDER Height & Weight     Self, Time, Situation, Place  Normal Incontinent Weight: 101 lb (45.8 kg) Height:  5\' 2"  (157.5 cm)  BEHAVIORAL SYMPTOMS/MOOD NEUROLOGICAL BOWEL NUTRITION STATUS      Continent Diet (See DC Summary)  AMBULATORY STATUS COMMUNICATION OF NEEDS Skin   Limited Assist Verbally Normal                       Personal Care Assistance Level of Assistance  Bathing, Dressing     Dressing Assistance: Limited assistance     Functional Limitations Info  Sight, Hearing, Speech Sight Info: Adequate Hearing Info: Adequate Speech Info: Adequate    SPECIAL CARE FACTORS FREQUENCY  PT (By licensed PT), OT (By licensed OT)     PT Frequency: 5x wk OT Frequency: 5x wk            Contractures Contractures Info: Not present    Additional Factors Info  Code Status, Allergies Code Status Info: DNR Allergies Info: Codeine, Simvastatin           Current Medications (02/14/2017):  This is the current hospital active medication list Current Facility-Administered Medications  Medication Dose Route Frequency Provider Last Rate Last Dose  . 0.9 %  sodium chloride infusion   Intravenous Continuous Russella DarAllison L Ellis, NP 50 mL/hr at 02/14/17 0356    . acetaminophen (TYLENOL) tablet 650 mg  650 mg Oral Q6H PRN Russella DarAllison L Ellis, NP   650 mg at 02/14/17 0505   Or  .  acetaminophen (TYLENOL) suppository 650 mg  650 mg Rectal Q6H PRN Russella Dar, NP      . atorvastatin (LIPITOR) tablet 10 mg  10 mg Oral Daily Russella Dar, NP   10 mg at 02/14/17 0946  . buPROPion [facility] SR) 12 hr tablet 100 mg  100 mg Oral BID Russella Dar, NP   100 mg at 02/14/17 1610  . feeding supplement (BOOST / RESOURCE BREEZE) liquid 1 Container  1 Container Oral TID BM Russella Dar, NP   1 Container at 02/14/17 0945  . ondansetron (ZOFRAN) tablet 4 mg  4 mg Oral Q6H PRN Russella Dar, NP       Or   . ondansetron [facility]) injection 4 mg  4 mg Intravenous Q6H PRN Russella Dar, NP      . pantoprazole (PROTONIX) EC tablet 40 mg  40 mg Oral Daily Richarda Overlie, MD   40 mg at 02/14/17 9604     Discharge Medications: Please see discharge summary for a list of discharge medications.  Relevant Imaging Results:  Relevant Lab Results:   Additional Information [id number]  Kem Kays B, LCSWA

## 2017-02-15 ENCOUNTER — Non-Acute Institutional Stay (SKILLED_NURSING_FACILITY): Payer: Medicare Other | Admitting: Internal Medicine

## 2017-02-15 ENCOUNTER — Encounter: Payer: Self-pay | Admitting: Internal Medicine

## 2017-02-15 DIAGNOSIS — K219 Gastro-esophageal reflux disease without esophagitis: Secondary | ICD-10-CM | POA: Diagnosis not present

## 2017-02-15 DIAGNOSIS — M1610 Unilateral primary osteoarthritis, unspecified hip: Secondary | ICD-10-CM | POA: Diagnosis not present

## 2017-02-15 DIAGNOSIS — D638 Anemia in other chronic diseases classified elsewhere: Secondary | ICD-10-CM

## 2017-02-15 DIAGNOSIS — I69391 Dysphagia following cerebral infarction: Secondary | ICD-10-CM

## 2017-02-15 DIAGNOSIS — Z8673 Personal history of transient ischemic attack (TIA), and cerebral infarction without residual deficits: Secondary | ICD-10-CM

## 2017-02-15 DIAGNOSIS — F0390 Unspecified dementia without behavioral disturbance: Secondary | ICD-10-CM | POA: Diagnosis not present

## 2017-02-15 DIAGNOSIS — E43 Unspecified severe protein-calorie malnutrition: Secondary | ICD-10-CM

## 2017-02-15 DIAGNOSIS — R531 Weakness: Secondary | ICD-10-CM

## 2017-02-15 DIAGNOSIS — N179 Acute kidney failure, unspecified: Secondary | ICD-10-CM | POA: Diagnosis not present

## 2017-02-15 NOTE — Progress Notes (Signed)
LOCATION: [facility]  PCP: Geraldo Pitter, MD   Code Status: DNR  Goals of care: Advanced Directive information Advanced Directives 02/12/2017  Does Patient Have a Medical Advance Directive? Yes  Type of Advance Directive Out of facility DNR (pink MOST or yellow form)  Does patient want to make changes to medical advance directive? No - Patient declined  Copy of Healthcare Power of Attorney in Chart? -  Would patient like information on creating a medical advance directive? -       Extended Emergency Contact Information Primary Emergency Contact: [name],[name] Address: [address]          [place], [place] [address] [place] Home Phone: [phone] Mobile Phone: [phone] Relation: Daughter Secondary Emergency Contact: [name] [place] Mobile Phone: [phone] Relation: Son   Allergies  Allergen Reactions  . Codeine Itching  . Simvastatin Itching    Chief Complaint  Patient presents with  . Readmit To SNF    Readmission Visit      HPI:  Patient is a 83 y.o. female seen today for short term rehabilitation post hospital re-admission from 02/12/17-02/14/17 with symptomatic anemia and required 2 u prbc transfusion. Her anemia was thought to be from chronic illness. She was dehydrated and had acute on chronic renal failure and responded well to iv fluids. Of note, patient was undergoing rehabilitation at this facility post hospital admission for acute on chronic renal failure with hypercalcemia. She is seen in her room today. She has PMH of COPD, HTN, GERD, dementia among others.   Review of Systems:  Constitutional: Negative for fever, chills, diaphoresis. Energy level is fair HENT: Negative for headache, congestion, nasal discharge.   Eyes: Negative for double vision and discharge. patient has history of cataract Respiratory: Negative for cough, shortness of breath and wheezing.   Cardiovascular: Negative for chest pain,  palpitations, leg swelling.  Gastrointestinal: Negative for heartburn, vomiting, abdominal pain, melena. Positive for nausea and poor appetite. Last bowel movement was this morning.  Genitourinary: Negative for dysuria Musculoskeletal: Negative for back pain, fall in the facility.  Skin: Negative for itching, rash.  Neurological: Positive for occasional dizziness. Psychiatric/Behavioral: Negative for depression   Past Medical History:  Diagnosis Date  . ARTHRITIS, LEFT HIP 04/18/2008  . CATARACTS, BILATERAL 04/18/2008  . COPD 04/18/2008  . DEPRESSION 07/08/2007  . GERD 07/08/2007  . HYPERTENSION 07/08/2007  . Stroke (HCC)   . Unspecified disorder of parathyroid gland 11/14/2010   Past Surgical History:  Procedure Laterality Date  . COLOSTOMY     partial  . EP IMPLANTABLE DEVICE N/A 06/18/2015   Procedure: Loop Recorder Insertion;  Surgeon: Marinus Maw, MD;  Location: [facility] INVASIVE CV LAB;  Service: Cardiovascular;  Laterality: N/A;  . TEE WITHOUT CARDIOVERSION N/A 06/18/2015   Procedure: TRANSESOPHAGEAL ECHOCARDIOGRAM (TEE);  Surgeon: Chrystie Nose, MD;  Location: [facility] ENDOSCOPY;  Service: Cardiovascular;  Laterality: N/A;   Social History:   reports that she has quit smoking. She has never used smokeless tobacco. She reports that she does not drink alcohol or use drugs.  Family History  Problem Relation Age of Onset  . Diabetes Other   . Hypertension Other   . Cancer Other     prostate  . Heart disease Other     Medications: Allergies as of 02/15/2017      Reactions   Codeine Itching   Simvastatin Itching      Medication List  Accurate as of 02/15/17 12:56 PM. Always use your most recent med list.          acetaminophen 325 MG tablet Commonly known as:  TYLENOL Take 2 tablets (650 mg total) by mouth every 6 (six) hours as needed for mild pain, moderate pain, fever or headache (or Fever >/= 101).   atorvastatin 20 MG tablet Commonly known as:  LIPITOR Take 1  tablet (20 mg total) by mouth daily.   buPROPion 100 MG 12 hr tablet Commonly known as:  WELLBUTRIN SR TAKE 1 TABLET (100 MG TOTAL) BY MOUTH daily   clopidogrel 75 MG tablet Commonly known as:  PLAVIX Take 1 tablet (75 mg total) by mouth daily.   feeding supplement (PRO-STAT SUGAR FREE 64) Liqd Take 30 mLs by mouth daily.   pantoprazole 40 MG tablet Commonly known as:  PROTONIX Take 1 tablet (40 mg total) by mouth daily.   UNABLE TO FIND Med Name: Med pass 120 mL by mouth 2 times daily       Immunizations: Immunization History  Administered Date(s) Administered  . PPD Test 01/21/2017, 02/07/2017  . Pneumococcal Polysaccharide-23 11/30/2004  . Td 11/30/2004     Physical Exam: Vitals:   02/15/17 1248  BP: 135/86  Pulse: 88  Resp: 18  Temp: 98.3 F (36.8 C)  TempSrc: Oral  SpO2: 98%  Weight: 101 lb (45.8 kg)  Height: 5\' 2"  (1.575 m)   Body mass index is 18.47 kg/m.  General- elderly female, frail and thin built, in no acute distress Head- normocephalic, atraumatic Nose- no maxillary or frontal sinus tenderness, no nasal discharge Throat- moist mucus membrane  Eyes- PERRLA, EOMI, + mild pallor, no icterus, no discharge Neck- no cervical lymphadenopathy Cardiovascular- normal s1,s2, no murmur Respiratory- bilateral clear to auscultation, no wheeze, no rhonchi, no crackles, no use of accessory muscles Abdomen- bowel sounds present, soft, non tender, no guarding or rigidity Musculoskeletal- able to move all 4 extremities, limited ROM to her legs with weakness Neurological- alert and oriented to person and place but not to time Skin- warm and dry Psychiatry- normal mood and affect    Labs reviewed: Basic Metabolic Panel:  Recent Labs  16/08/9601/22/18 0549  02/11/17 02/12/17 1040 02/13/17 0205  NA 138  < > 141 138 136  K 4.1  < > 4.7 3.9 4.2  CL 105  --   --  105 106  CO2 23  --   --  24 21*  GLUCOSE 84  --   --  106* 89  BUN 20  < > 26* 23* 20    CREATININE 1.35*  < > 1.2* 1.26* 1.17*  CALCIUM 10.9*  --   --  10.5* 10.1  < > = values in this interval not displayed. Liver Function Tests:  Recent Labs  12/22/16 1454 02/01/17 02/11/17 02/13/17 0205  AST 25 61* 26 27  28   ALT 15 65* 32 32  32  ALKPHOS 85 84 81 70  70  BILITOT 0.6  --   --  0.7  0.6  PROT 7.3  --   --  6.4*  6.3*  ALBUMIN 4.0  --   --  2.1*  2.1*   No results for input(s): LIPASE, AMYLASE in the last 8760 hours. No results for input(s): AMMONIA in the last 8760 hours. CBC:  Recent Labs  03/12/16 0756  02/12/17 1040 02/13/17 0205 02/14/17 0653  WBC 6.7  < > 7.5 8.5 8.6  NEUTROABS 5.0  --  5.8  --   --   HGB 11.6*  < > 7.6* 9.9* 9.3*  HCT 35.9*  < > 24.9* 30.2* 29.1*  MCV 96.2  < > 85.3 82.7 83.1  PLT 217  < > 611* 501* 456*  < > = values in this interval not displayed.   Radiological Exams: No results found.  Assessment/Plan  Generalized weakness With recent hospitalization. Will have her work with physical therapy and occupational therapy to help regain her strength and restore her balance. Fall precautions to be taken.  Anemia of chronic disease s/p 2 u prbc in hospital. Will need further workup with hematology. No active bleed reported.  gerd No gi bleed reported. Continue protonix 40 mg daily  Acute on ckd 3 s/p iv fluid. Monitor bmp  Severe protein calorie malnutrition RD to evaluate. Decline anticipated with her deconditioning and dementia. Continue medpass, prostat  Dysphagia With her dementia. Aspiration precautions. On mechanical soft diet with thin liquid  Left hip OA Continue tylenol 650 mg q6h prn pain and monitor. Will have patient work with PT/OT as tolerated to regain strength and restore function.  Fall precautions are in place.  Dementia Currently not on any medication. Provide supportive care for now. Monitor for behavior changes.  History of stroke Continue Plavix and statin.  Goals of care: short term  rehabilitation, possible long-term care   Labs/tests ordered: cbc, bmp 1 week  Family/ staff Communication: reviewed care plan with patient and nursing supervisor    Oneal Grout, MD Internal Medicine [facility] [address] [place], [place] [address] Cell Phone (Monday-Friday 8 am - 5 pm): [phone] On Call: [phone] and follow prompts after 5 pm and on weekends Office Phone: [phone] Office Fax: [phone]

## 2017-02-22 DIAGNOSIS — Z79899 Other long term (current) drug therapy: Secondary | ICD-10-CM | POA: Diagnosis not present

## 2017-02-22 LAB — CBC AND DIFFERENTIAL
HEMATOCRIT: 30 % — AB (ref 36–46)
HEMOGLOBIN: 9.3 g/dL — AB (ref 12.0–16.0)
PLATELETS: 555 10*3/uL — AB (ref 150–399)
WBC: 12.7 10*3/mL

## 2017-02-22 LAB — BASIC METABOLIC PANEL
BUN: 23 mg/dL — AB (ref 4–21)
Creatinine: 1 mg/dL (ref 0.5–1.1)
GLUCOSE: 81 mg/dL
Potassium: 4.5 mmol/L (ref 3.4–5.3)
Sodium: 141 mmol/L (ref 137–147)

## 2017-02-23 ENCOUNTER — Encounter: Payer: Self-pay | Admitting: Family

## 2017-02-23 ENCOUNTER — Non-Acute Institutional Stay (SKILLED_NURSING_FACILITY): Payer: Medicare Other | Admitting: Family

## 2017-02-23 DIAGNOSIS — R05 Cough: Secondary | ICD-10-CM | POA: Diagnosis not present

## 2017-02-23 DIAGNOSIS — D72829 Elevated white blood cell count, unspecified: Secondary | ICD-10-CM

## 2017-02-23 NOTE — Progress Notes (Signed)
Location:  [facility] Room Number: 801 B Place of Service:  SNF (31) Provider:  Dinah Ngetich FNP-C   Geraldo Pitter, MD  Patient Care Team: [name], MD as PCP - General [facility])  Extended Emergency Contact Information Primary Emergency Contact: [name],[name] Address: [address]          [place], [place] [address] [place] of [place] Home Phone: [phone] Mobile Phone: [phone] Relation: Daughter Secondary Emergency Contact: [name] [place] Mobile Phone: [phone] Relation: Son  Code Status:  DNR  Goals of care: Advanced Directive information Advanced Directives 02/23/2017  Does Patient Have a Medical Advance Directive? Yes  Type of Advance Directive Out of facility DNR (pink MOST or yellow form)  Does patient want to make changes to medical advance directive? No - Patient declined  Copy of Healthcare Power of Attorney in Chart? Yes  Would patient like information on creating a medical advance directive? -  Pre-existing out of facility DNR order (yellow form or pink MOST form) Yellow form placed in chart (order not valid for inpatient use)     Chief Complaint  Patient presents with  . Abnormal Lab    HPI:  Pt is a 83 y.o. female seen today at [facility]  for an acute visit for abnormal labs.She has a medical history of HTN, Stroke,COPD,GERD among other conditions.She is seen in her room today.Her recent lab results showed WBC 12.7 with Neutrophils 84.2, Hgb 9.3 HCT 30.3 ( 02/22/2017).She denies any fever or chills.   Past Medical History:  Diagnosis Date  . ARTHRITIS, LEFT HIP 04/18/2008  . CATARACTS, BILATERAL 04/18/2008  . COPD 04/18/2008  . DEPRESSION 07/08/2007  . GERD 07/08/2007  . HYPERTENSION 07/08/2007  . Stroke (HCC)   . Unspecified disorder of parathyroid gland 11/14/2010   Past Surgical History:  Procedure Laterality Date  . COLOSTOMY     partial  . EP  IMPLANTABLE DEVICE N/A 06/18/2015   Procedure: Loop Recorder Insertion;  Surgeon: Marinus Maw, MD;  Location: [facility] INVASIVE CV LAB;  Service: Cardiovascular;  Laterality: N/A;  . TEE WITHOUT CARDIOVERSION N/A 06/18/2015   Procedure: TRANSESOPHAGEAL ECHOCARDIOGRAM (TEE);  Surgeon: Chrystie Nose, MD;  Location: [facility] ENDOSCOPY;  Service: Cardiovascular;  Laterality: N/A;    Allergies  Allergen Reactions  . Codeine Itching  . Simvastatin Itching    Allergies as of 02/23/2017      Reactions   Codeine Itching   Simvastatin Itching      Medication List       Accurate as of 02/23/17  4:56 PM. Always use your most recent med list.          acetaminophen 325 MG tablet Commonly known as:  TYLENOL Take 2 tablets (650 mg total) by mouth every 6 (six) hours as needed for mild pain, moderate pain, fever or headache (or Fever >/= 101).   atorvastatin 10 MG tablet Commonly known as:  LIPITOR Take 10 mg by mouth daily.   buPROPion 100 MG 12 hr tablet Commonly known as:  WELLBUTRIN SR Take 100 mg by mouth 2 (two) times daily.   clopidogrel 75 MG tablet Commonly known as:  PLAVIX Take 1 tablet (75 mg total) by mouth daily.   pantoprazole 40 MG tablet Commonly known as:  PROTONIX Take 1 tablet (40 mg total) by mouth daily.   UNABLE TO FIND Med Name: Med pass 120 mL by mouth 2 times daily  Review of Systems  Constitutional: Negative for activity change, appetite change, chills, fatigue and fever.  HENT: Negative for congestion, rhinorrhea, sinus pain, sinus pressure, sneezing and sore throat.   Eyes: Negative.   Respiratory: Positive for cough. Negative for chest tightness, shortness of breath and wheezing.   Cardiovascular: Negative for chest pain, palpitations and leg swelling.  Gastrointestinal: Negative for abdominal distention, abdominal pain, constipation, diarrhea, nausea and vomiting.  Genitourinary: Negative for dysuria, flank pain, frequency and urgency.    Musculoskeletal: Positive for gait problem.  Skin: Negative for color change, pallor and rash.  Neurological: Negative for dizziness, syncope, light-headedness and headaches.  Psychiatric/Behavioral: Negative for agitation, confusion, hallucinations and sleep disturbance. The patient is not nervous/anxious.     Immunization History  Administered Date(s) Administered  . PPD Test 01/21/2017, 02/07/2017  . Pneumococcal Polysaccharide-23 11/30/2004  . Td 11/30/2004   Pertinent  Health Maintenance Due  Topic Date Due  . DEXA SCAN  02/13/1997  . PNA vac Low Risk Adult (2 of 2 - PCV13) 11/30/2005  . INFLUENZA VACCINE  06/30/2016    Vitals:   02/23/17 1018  BP: (!) 110/54  Pulse: 94  Resp: 18  Temp: 97.9 F (36.6 C)  TempSrc: Oral  SpO2: 99%  Weight: 101 lb (45.8 kg)  Height: 5\' 2"  (1.575 m)   Body mass index is 18.47 kg/m. Physical Exam  Constitutional:  Thin frail elderly in no acute distress   HENT:  Head: Normocephalic.  Mouth/Throat: Oropharynx is clear and moist. No oropharyngeal exudate.  Eyes: Conjunctivae and EOM are normal. Pupils are equal, round, and reactive to light. Right eye exhibits no discharge. Left eye exhibits no discharge. No scleral icterus.  Neck: Normal range of motion. No JVD present. No thyromegaly present.  Cardiovascular: Normal rate, regular rhythm, normal heart sounds and intact distal pulses.  Exam reveals no gallop and no friction rub.   No murmur heard. Pulmonary/Chest: Effort normal. No respiratory distress. She has no wheezes.  RLL rales to auscultation   Abdominal: Soft. Bowel sounds are normal. She exhibits no distension. There is no tenderness. There is no rebound and no guarding.  Genitourinary:  Genitourinary Comments: Incontinent   Musculoskeletal: She exhibits no edema, tenderness or deformity.  Moves x 4 extremities  Lymphadenopathy:    She has no cervical adenopathy.  Neurological:  Alert and oriented x 2   Skin: Skin is  warm and dry. No rash noted. No erythema. No pallor.  Psychiatric: She has a normal mood and affect.    Labs reviewed:  Recent Labs  01/21/17 0549  02/12/17 1040 02/13/17 0205 02/22/17  NA 138  < > 138 136 141  K 4.1  < > 3.9 4.2 4.5  CL 105  --  105 106  --   CO2 23  --  24 21*  --   GLUCOSE 84  --  106* 89  --   BUN 20  < > 23* 20 23*  CREATININE 1.35*  < > 1.26* 1.17* 1.0  CALCIUM 10.9*  --  10.5* 10.1  --   < > = values in this interval not displayed.  Recent Labs  12/22/16 1454 02/01/17 02/11/17 02/13/17 0205  AST 25 61* 26 27  28   ALT 15 65* 32 32  32  ALKPHOS 85 84 81 70  70  BILITOT 0.6  --   --  0.7  0.6  PROT 7.3  --   --  6.4*  6.3*  ALBUMIN 4.0  --   --  2.1*  2.1*    Recent Labs  03/12/16 0756  02/12/17 1040 02/13/17 0205 02/14/17 0653 02/22/17  WBC 6.7  < > 7.5 8.5 8.6 12.7  NEUTROABS 5.0  --  5.8  --   --   --   HGB 11.6*  < > 7.6* 9.9* 9.3* 9.3*  HCT 35.9*  < > 24.9* 30.2* 29.1* 30*  MCV 96.2  < > 85.3 82.7 83.1  --   PLT 217  < > 611* 501* 456* 555*  < > = values in this interval not displayed. Lab Results  Component Value Date   TSH 1.009 02/13/2017   Lab Results  Component Value Date   HGBA1C 5.6 06/17/2015   Lab Results  Component Value Date   CHOL 127 02/13/2017   HDL 42 02/13/2017   LDLCALC 75 02/13/2017   LDLDIRECT 173.0 04/11/2007   TRIG 52 02/13/2017   CHOLHDL 3.0 02/13/2017   Assessment/Plan  Leukocytosis   Afebrile.No urinary symptoms.Recent urine specimen negative for UTI. Right lower lobe lung rales noted on exam. Will obtain portable CXR Pa/Lat rule out PNA. Recheck CBC/diff 02/26/2017.Continue to monitor.   Family/ staff Communication: reviewed plan of care with patient and facility Nurse supervisor.   Labs/tests ordered: Portable CXR Pa/Lat rule out PNA

## 2017-03-01 DIAGNOSIS — M1612 Unilateral primary osteoarthritis, left hip: Secondary | ICD-10-CM | POA: Diagnosis not present

## 2017-03-04 ENCOUNTER — Non-Acute Institutional Stay (SKILLED_NURSING_FACILITY): Payer: Medicare Other | Admitting: Family

## 2017-03-04 ENCOUNTER — Encounter: Payer: Self-pay | Admitting: Family

## 2017-03-04 DIAGNOSIS — I10 Essential (primary) hypertension: Secondary | ICD-10-CM

## 2017-03-04 DIAGNOSIS — R000 Tachycardia, unspecified: Secondary | ICD-10-CM

## 2017-03-04 DIAGNOSIS — E782 Mixed hyperlipidemia: Secondary | ICD-10-CM

## 2017-03-04 MED ORDER — METOPROLOL SUCCINATE ER 25 MG PO TB24: 12.5000 mg | ORAL_TABLET | Freq: Every day | ORAL | 3 refills | Status: AC

## 2017-03-04 NOTE — Progress Notes (Signed)
Location:  [facility] Room Number: 801B Place of Service:  SNF (31) Provider:  Richarda Blade, FNP-C  Geraldo Pitter, MD  Patient Care Team: [name], MD as PCP - General [facility] Medicine)  Extended Emergency Contact Information Primary Emergency Contact: [name],[name] Address: [address]          [place], [place] [address] [place] of [place] Home Phone: [phone] Mobile Phone: [phone] Relation: Daughter Secondary Emergency Contact: [name] [place] of [place] Mobile Phone: [phone] Relation: Son  Code Status:  DNR Goals of care: Advanced Directive information Advanced Directives 03/04/2017  Does Patient Have a Medical Advance Directive? Yes  Type of Advance Directive Out of facility DNR (pink MOST or yellow form)  Does patient want to make changes to medical advance directive? No - Patient declined  Copy of Healthcare Power of Attorney in Chart? -  Would patient like information on creating a medical advance directive? -  Pre-existing out of facility DNR order (yellow form or pink MOST form) -     Chief Complaint  Patient presents with  . Medical Management of Chronic Issues    Routine Visit    HPI:  Pt is a 83 y.o. female seen today for medical management of chronic diseases.She has a medical history of HTN, Hyperlipidemia,Stroke, TIA, COPD, GERD, CKD stage 3,Dementia, Depression among other conditions. She is seen in her room today watching TV.She denies any acute issues though HPI and ROS limited due to cognitive impairment.she is able to make needs know to facility staff. Her HR ranging in the 90's. Recent EKG ( 02/12/2017) from the ED reviewed showed NSR ventricular heart rate 100 b/min.TSH level 1.009 ( 02/13/2017). She denies any chest pain or shortness of breath. Facility Nurse reports no new concerns.      Past Medical History:  Diagnosis Date  . ARTHRITIS, LEFT HIP 04/18/2008  . CATARACTS, BILATERAL  04/18/2008  . COPD 04/18/2008  . DEPRESSION 07/08/2007  . GERD 07/08/2007  . HYPERTENSION 07/08/2007  . Stroke (HCC)   . Unspecified disorder of parathyroid gland 11/14/2010   Past Surgical History:  Procedure Laterality Date  . COLOSTOMY     partial  . EP IMPLANTABLE DEVICE N/A 06/18/2015   Procedure: Loop Recorder Insertion;  Surgeon: Marinus Maw, MD;  Location: [facility] INVASIVE CV LAB;  Service: Cardiovascular;  Laterality: N/A;  . TEE WITHOUT CARDIOVERSION N/A 06/18/2015   Procedure: TRANSESOPHAGEAL ECHOCARDIOGRAM (TEE);  Surgeon: Chrystie Nose, MD;  Location: [facility] ENDOSCOPY;  Service: Cardiovascular;  Laterality: N/A;    Allergies  Allergen Reactions  . Codeine Itching  . Simvastatin Itching    Allergies as of 03/04/2017      Reactions   Codeine Itching   Simvastatin Itching      Medication List       Accurate as of 03/04/17 10:28 AM. Always use your most recent med list.          acetaminophen 325 MG tablet Commonly known as:  TYLENOL Take 650 mg by mouth every 6 (six) hours as needed for mild pain or fever. Fever greater than  101   atorvastatin 20 MG tablet Commonly known as:  LIPITOR Take 20 mg by mouth at bedtime.   buPROPion 100 MG 12 hr tablet Commonly known as:  WELLBUTRIN SR Take 100 mg by mouth at bedtime.   clopidogrel 75 MG tablet Commonly known as:  PLAVIX Take 1 tablet (75 mg total) by mouth daily.   pantoprazole  40 MG tablet Commonly known as:  PROTONIX Take 1 tablet (40 mg total) by mouth daily.   UNABLE TO FIND Med Name: Med pass 120 mL by mouth 2 times daily       Review of Systems  Constitutional: Negative for activity change, appetite change, chills, fatigue and fever.  HENT: Negative for congestion, rhinorrhea, sinus pain, sinus pressure, sneezing and sore throat.   Eyes: Negative.   Respiratory: Negative for cough, chest tightness, shortness of breath and wheezing.   Cardiovascular: Negative for chest pain, palpitations and leg swelling.   Gastrointestinal: Negative for abdominal distention, abdominal pain, constipation, diarrhea, nausea and vomiting.  Endocrine: Negative.   Genitourinary: Negative for dysuria, flank pain, frequency and urgency.  Musculoskeletal: Positive for gait problem.  Skin: Negative for color change, pallor and rash.  Neurological: Negative for dizziness, syncope, light-headedness and headaches.  Hematological: Does not bruise/bleed easily.  Psychiatric/Behavioral: Negative for agitation, confusion, hallucinations and sleep disturbance. The patient is not nervous/anxious.     Immunization History  Administered Date(s) Administered  . PPD Test 01/21/2017, 02/07/2017  . Pneumococcal Polysaccharide-23 11/30/2004  . Td 11/30/2004   Pertinent  Health Maintenance Due  Topic Date Due  . DEXA SCAN  02/13/1997  . PNA vac Low Risk Adult (2 of 2 - PCV13) 11/30/2005  . INFLUENZA VACCINE  06/30/2017    Vitals:   03/04/17 0933  BP: (!) 151/84  Pulse: 91  Resp: 17  Temp: 97.9 F (36.6 C)  Weight: 94 lb 9.6 oz (42.9 kg)  Height:  (1.575 m)   Body mass index is 17.3 kg/m. Physical Exam  Constitutional:  Thin frail elderly in no acute distress   HENT:  Head: Normocephalic.  Mouth/Throat: Oropharynx is clear and moist. No oropharyngeal exudate.  Eyes: Conjunctivae and EOM are normal. Pupils are equal, round, and reactive to light. Right eye exhibits no discharge. Left eye exhibits no discharge. No scleral icterus.  Neck: Normal range of motion. No JVD present. No thyromegaly present.  Cardiovascular: Regular rhythm, normal heart sounds and intact distal pulses.  Exam reveals no gallop and no friction rub.   No murmur heard. Tachy   Pulmonary/Chest: Effort normal and breath sounds normal. No respiratory distress. She has no wheezes.  Abdominal: Soft. Bowel sounds are normal. She exhibits no distension. There is no tenderness. There is no rebound and no guarding.  Genitourinary:  Genitourinary  Comments: Incontinent   Musculoskeletal: She exhibits no edema, tenderness or deformity.  Moves x 4 extremities. Unsteady gait   Lymphadenopathy:    She has no cervical adenopathy.  Neurological:  Alert and oriented x 2   Skin: Skin is warm and dry. No rash noted. No erythema. No pallor.  Psychiatric: She has a normal mood and affect.    Labs reviewed:  Recent Labs  01/21/17 0549  02/12/17 1040 02/13/17 0205 02/22/17  NA 138  < > 138 136 141  K 4.1  < > 3.9 4.2 4.5  CL 105  --  105 106  --   CO2 23  --  24 21*  --   GLUCOSE 84  --  106* 89  --   BUN 20  < > 23* 20 23*  CREATININE 1.35*  < > 1.26* 1.17* 1.0  CALCIUM 10.9*  --  10.5* 10.1  --   < > = values in this interval not displayed.  Recent Labs  12/22/16 1454 02/01/17 02/11/17 02/13/17 0205  AST 25 61* 26 27  28  ALT 15 65* 32 32  32  ALKPHOS 85 84 81 70  70  BILITOT 0.6  --   --  0.7  0.6  PROT 7.3  --   --  6.4*  6.3*  ALBUMIN 4.0  --   --  2.1*  2.1*    Recent Labs  03/12/16 0756  02/12/17 1040 02/13/17 0205 02/14/17 0653 02/22/17  WBC 6.7  < > 7.5 8.5 8.6 12.7  NEUTROABS 5.0  --  5.8  --   --   --   HGB 11.6*  < > 7.6* 9.9* 9.3* 9.3*  HCT 35.9*  < > 24.9* 30.2* 29.1* 30*  MCV 96.2  < > 85.3 82.7 83.1  --   PLT 217  < > 611* 501* 456* 555*  < > = values in this interval not displayed. Lab Results  Component Value Date   TSH 1.009 02/13/2017   Lab Results  Component Value Date   HGBA1C 5.6 06/17/2015   Lab Results  Component Value Date   CHOL 127 02/13/2017   HDL 42 02/13/2017   LDLCALC 75 02/13/2017   LDLDIRECT 173.0 04/11/2007   TRIG 52 02/13/2017   CHOLHDL 3.0 02/13/2017   Assessment/Plan HTN B/p elevated 15/84. Facility log reviewed B/p ranging in the 110/70's-130's/70's.will continue to monitor.Vital signs every shift X one week.   Hyperlipidemia  Recent lipid panel within range. Atorvastatin 20 mg Tablet reduced to 10 mg tablet at bedtime but not changed on facility  MAR.Nurse Supervisor notified.continue on Lipitor 10 mg tablet. Recheck Lipid panel in 3 months.    Tachycardia  HR in the 90's. EKG ( 02/12/2017) from the ED reviewed showed NSR ventricular heart rate 100 b/min.TSH level 1.009 ( 02/13/2017).Will start on low dose metoprolol succinate 12.5 mg Tablet   Family/ staff Communication: Reviewed plan of care with patient and facility Nurse supervisor.   Labs/tests ordered: Lipid panel in 3 months.

## 2017-03-10 DIAGNOSIS — H2512 Age-related nuclear cataract, left eye: Secondary | ICD-10-CM | POA: Diagnosis not present

## 2017-03-10 DIAGNOSIS — H539 Unspecified visual disturbance: Secondary | ICD-10-CM | POA: Diagnosis not present

## 2017-03-17 IMAGING — CR DG HAND COMPLETE 3+V*L*
3 series · 3 of 3 positions shown · non-contrast
Comparison: None.

CLINICAL DATA: 85-year-old female with third through fifth MCP
joint pain and swelling. Confused as to recent injury, may have
fallen. Initial encounter.

EXAM:
LEFT HAND - COMPLETE 3+ VIEW

[x hand pa left]
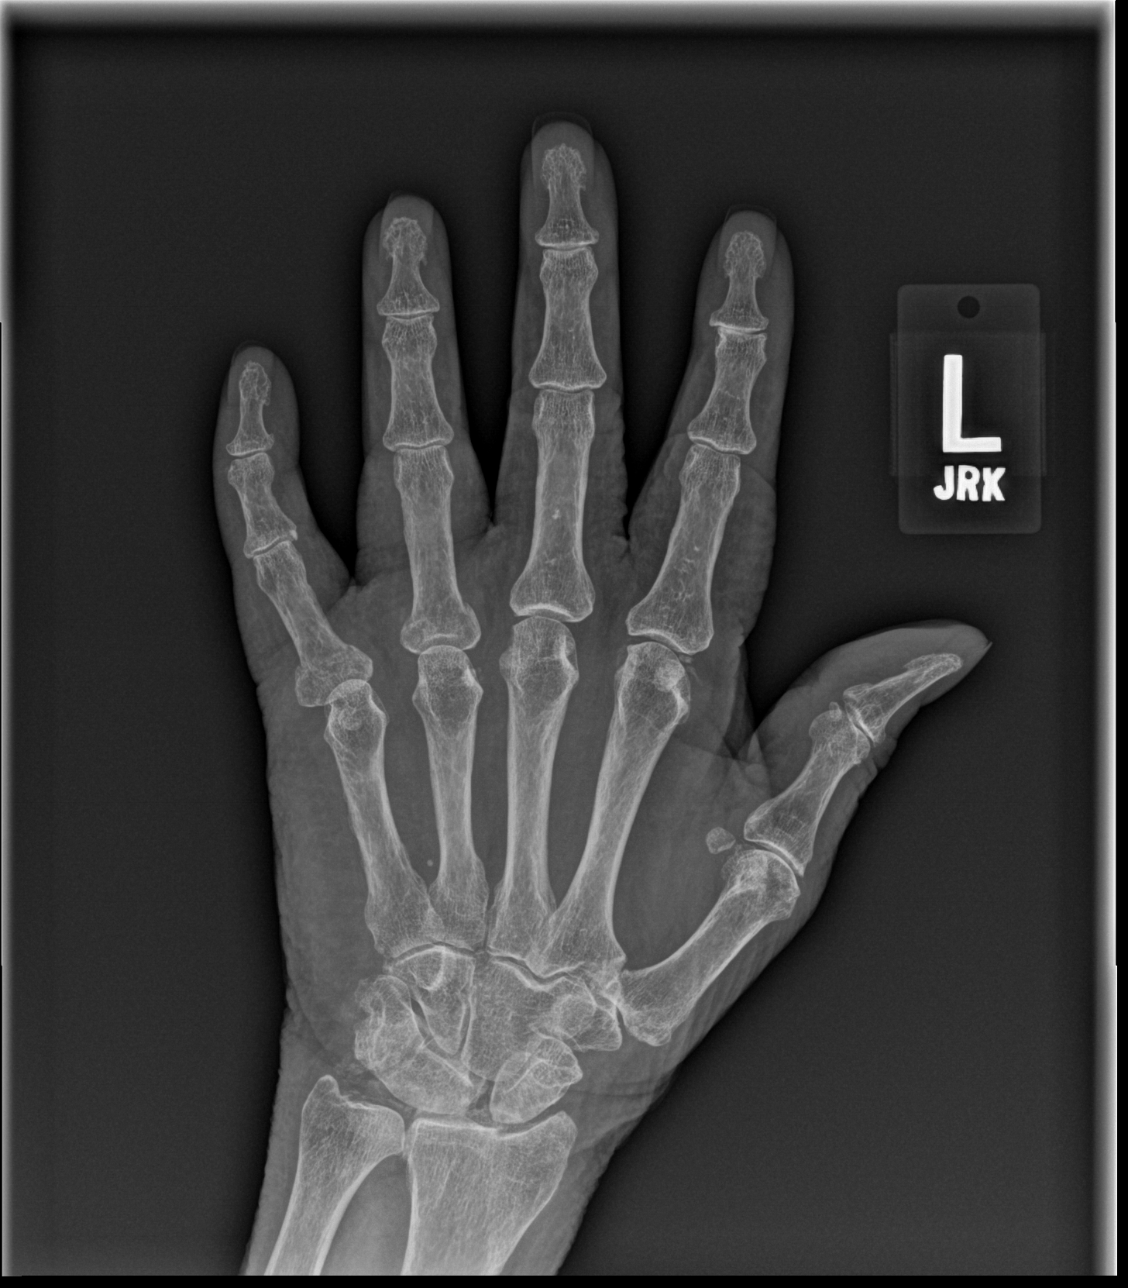

[x hand obl left]
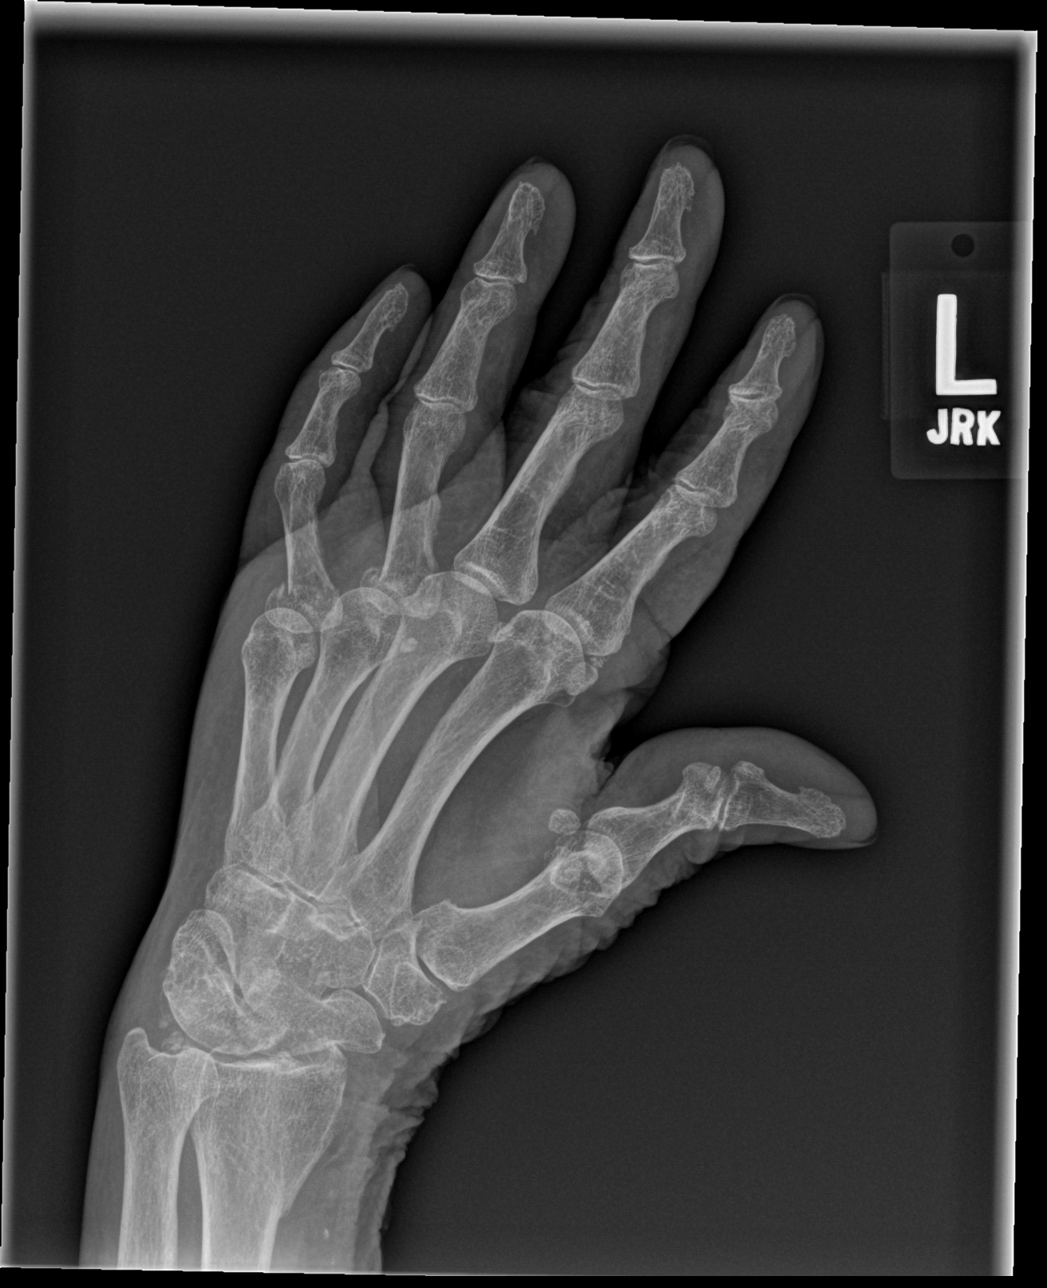

[x hand lat left]
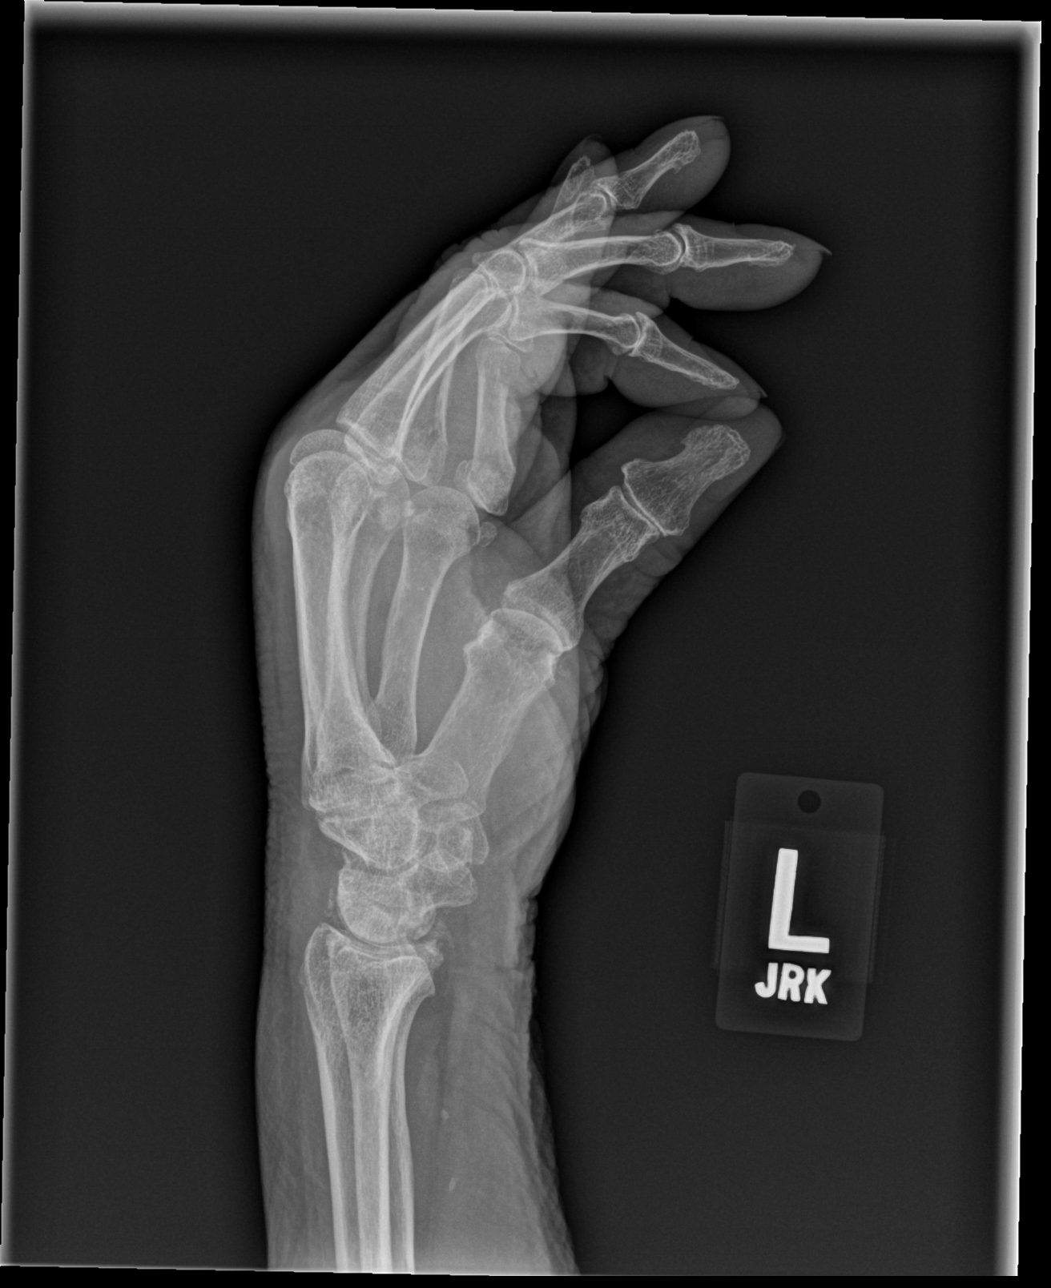

[3 of 3 positions shown; findings below may reference images not displayed]

FINDINGS: Comminuted intra-articular fractures at the bases of the fourth and
fifth proximal phalanges, each with mild impaction and dorsal
angulation. The adjacent fourth and fifth metacarpal heads appear
intact. Underlying osteopenia. No other acute fracture or
dislocation identified. Chondrocalcinosis at the radiocarpal joint
and other degenerative changes in the carpal bones. Including
scapholunate dissociation Chronic appearing deformity of the distal
left radius. Mild calcified peripheral vascular disease.
IMPRESSION: 1. Comminuted intra-articular fractures at the bases of the fourth
and fifth proximal phalanges with mild impaction and dorsal
angulation.
2. Osteopenia. No other No acute fracture or dislocation identified
about the .
3. Chondrocalcinosis and degenerative changes about the carpal bones
including scapholunate dissociation.

## 2017-03-29 DIAGNOSIS — L89152 Pressure ulcer of sacral region, stage 2: Secondary | ICD-10-CM | POA: Diagnosis not present

## 2017-04-02 ENCOUNTER — Encounter: Payer: Self-pay | Admitting: Family

## 2017-04-02 ENCOUNTER — Non-Acute Institutional Stay (SKILLED_NURSING_FACILITY): Payer: Medicare Other | Admitting: Family

## 2017-04-02 DIAGNOSIS — K219 Gastro-esophageal reflux disease without esophagitis: Secondary | ICD-10-CM

## 2017-04-02 DIAGNOSIS — I10 Essential (primary) hypertension: Secondary | ICD-10-CM | POA: Diagnosis not present

## 2017-04-02 DIAGNOSIS — F0390 Unspecified dementia without behavioral disturbance: Secondary | ICD-10-CM

## 2017-04-02 DIAGNOSIS — F32A Depression, unspecified: Secondary | ICD-10-CM

## 2017-04-02 DIAGNOSIS — F329 Major depressive disorder, single episode, unspecified: Secondary | ICD-10-CM

## 2017-04-02 NOTE — Progress Notes (Signed)
Location:  [facility] Room Number: 801-B Place of Service:  SNF (31) Provider:  Richarda Blade, FNP-C  Geraldo Pitter, MD  Patient Care Team: [name], MD as PCP - General [facility] Medicine)  Extended Emergency Contact Information Primary Emergency Contact: Adriana Middleton Address: [address]          [place], [place] [address] [place] of [place] Home Phone: [phone] Mobile Phone: [phone] Relation: Daughter Secondary Emergency Contact: Adriana Middleton [place] Mobile Phone: [phone] Relation: Son  Code Status:  DNR Goals of care: Advanced Directive information Advanced Directives 04/02/2017  Does Patient Have a Medical Advance Directive? Yes  Type of Advance Directive Out of facility DNR (pink MOST or yellow form)  Does patient want to make changes to medical advance directive? No - Patient declined  Copy of Healthcare Power of Attorney in Chart? Yes  Would patient like information on creating a medical advance directive? -  Pre-existing out of facility DNR order (yellow form or pink MOST form) -     Chief Complaint  Patient presents with  . Medical Management of Chronic Issues    HPI:  Pt is a 83 y.o. female seen today for medical management of chronic diseases.She has a medical history of HTN, Hyperlipidemia,Stroke, TIA, COPD, GERD, CKD stage 3,Dementia, Depression among other conditions. She is seen in her room today sitting on wheelchair crying.Patient's roommate states patient cries everyday.She states does not wants to go home. [name] came in later during the visit. POA told patient that she could not care for her at home due to her work schedule for now but will be coming to visit her at the facility. Patient very upset told daughter to leave.Psychiatry provider saw patient today and adjusted antidepressant.she has had no recent weight changes or fall episodes.   Past Medical History:   Diagnosis Date  . ARTHRITIS, LEFT HIP 04/18/2008  . CATARACTS, BILATERAL 04/18/2008  . COPD 04/18/2008  . DEPRESSION 07/08/2007  . GERD 07/08/2007  . HYPERTENSION 07/08/2007  . Stroke (HCC)   . Unspecified disorder of parathyroid gland 11/14/2010   Past Surgical History:  Procedure Laterality Date  . COLOSTOMY     partial  . EP IMPLANTABLE DEVICE N/A 06/18/2015   Procedure: Loop Recorder Insertion;  Surgeon: Marinus Maw, MD;  Location: [facility] INVASIVE CV LAB;  Service: Cardiovascular;  Laterality: N/A;  . TEE WITHOUT CARDIOVERSION N/A 06/18/2015   Procedure: TRANSESOPHAGEAL ECHOCARDIOGRAM (TEE);  Surgeon: Chrystie Nose, MD;  Location: [facility] ENDOSCOPY;  Service: Cardiovascular;  Laterality: N/A;    Allergies  Allergen Reactions  . Codeine Itching  . Simvastatin Itching    Allergies as of 04/02/2017      Reactions   Codeine Itching   Simvastatin Itching      Medication List       Accurate as of 04/02/17  2:53 PM. Always use your most recent med list.          acetaminophen 325 MG tablet Commonly known as:  TYLENOL Take 650 mg by mouth every 6 (six) hours as needed for mild pain or fever. Fever greater than  101   atorvastatin 20 MG tablet Commonly known as:  LIPITOR Take 20 mg by mouth daily.   buPROPion 100 MG 12 hr tablet Commonly known as:  WELLBUTRIN SR Take 100 mg by mouth every morning.   clopidogrel 75 MG tablet Commonly known as:  PLAVIX Take 1 tablet (75 mg total) by mouth  daily.   feeding supplement (PRO-STAT SUGAR FREE 64) Liqd Take 30 mLs by mouth daily. x30 days   metoprolol succinate 25 MG 24 hr tablet Commonly known as:  TOPROL-XL Take 0.5 tablets (12.5 mg total) by mouth daily.   MULTIVITAMIN/MINERALS PO Take 1 tablet by mouth daily.   pantoprazole 40 MG tablet Commonly known as:  PROTONIX Take 1 tablet (40 mg total) by mouth daily.   UNABLE TO FIND Med Name: Med pass 120 mL by mouth 2 times daily      Review of Systems  Constitutional:  Negative for activity change, appetite change, chills, fatigue and fever.  HENT: Negative for congestion, rhinorrhea, sinus pain, sinus pressure, sneezing and sore throat.   Eyes: Negative.   Respiratory: Negative for cough, chest tightness, shortness of breath and wheezing.   Cardiovascular: Negative for chest pain, palpitations and leg swelling.  Gastrointestinal: Negative for abdominal distention, abdominal pain, constipation, diarrhea, nausea and vomiting.  Endocrine: Negative.   Genitourinary: Negative for dysuria, flank pain, frequency and urgency.  Musculoskeletal: Positive for gait problem.  Skin: Negative for color change, pallor and rash.  Neurological: Negative for dizziness, syncope, light-headedness and headaches.  Hematological: Does not bruise/bleed easily.  Psychiatric/Behavioral: Negative for agitation, confusion, hallucinations and sleep disturbance. The patient is not nervous/anxious.     Immunization History  Administered Date(s) Administered  . PPD Test 01/21/2017, 02/07/2017  . Pneumococcal Polysaccharide-23 11/30/2004  . Td 11/30/2004   Pertinent  Health Maintenance Due  Topic Date Due  . PNA vac Low Risk Adult (2 of 2 - PCV13) 11/30/2017 (Originally 11/30/2005)  . DEXA SCAN  04/02/2018 (Originally 02/13/1997)  . INFLUENZA VACCINE  06/30/2017    Vitals:   04/02/17 0951  BP: 115/77  Pulse: 71  Resp: 17  Temp: 97.6 F (36.4 C)  TempSrc: Oral  SpO2: 97%  Weight: 94 lb (42.6 kg)  Height: 5\' 2"  (1.575 m)   Body mass index is 17.19 kg/m. Physical Exam  Constitutional:  Thin frail elderly in no acute distress   HENT:  Head: Normocephalic.  Mouth/Throat: Oropharynx is clear and moist. No oropharyngeal exudate.  Eyes: Conjunctivae and EOM are normal. Pupils are equal, round, and reactive to light. Right eye exhibits no discharge. Left eye exhibits no discharge. No scleral icterus.  Neck: Normal range of motion. No JVD present. No thyromegaly present.   Cardiovascular: Regular rhythm, normal heart sounds and intact distal pulses.  Exam reveals no gallop and no friction rub.   No murmur heard.   Pulmonary/Chest: Effort normal and breath sounds normal. No respiratory distress. She has no wheezes.  Abdominal: Soft. Bowel sounds are normal. She exhibits no distension. There is no tenderness. There is no rebound and no guarding.  Genitourinary:  Genitourinary Comments: Incontinent   Musculoskeletal: She exhibits no edema, tenderness or deformity.  Moves x 4 extremities. Unsteady gait   Lymphadenopathy:    She has no cervical adenopathy.  Neurological:  Alert and oriented x 2   Skin: Skin is warm and dry. No rash noted. No erythema. No pallor.  Psychiatric: She has a normal mood and affect.    Labs reviewed:  Recent Labs  01/21/17 0549  02/12/17 1040 02/13/17 0205 02/22/17  NA 138  < > 138 136 141  K 4.1  < > 3.9 4.2 4.5  CL 105  --  105 106  --   CO2 23  --  24 21*  --   GLUCOSE 84  --  106* 89  --  BUN 20  < > 23* 20 23*  CREATININE 1.35*  < > 1.26* 1.17* 1.0  CALCIUM 10.9*  --  10.5* 10.1  --   < > = values in this interval not displayed.  Recent Labs  12/22/16 1454 02/01/17 02/11/17 02/13/17 0205  AST 25 61* 26 27  28   ALT 15 65* 32 32  32  ALKPHOS 85 84 81 70  70  BILITOT 0.6  --   --  0.7  0.6  PROT 7.3  --   --  6.4*  6.3*  ALBUMIN 4.0  --   --  2.1*  2.1*    Recent Labs  02/12/17 1040 02/13/17 0205 02/14/17 0653 02/22/17  WBC 7.5 8.5 8.6 12.7  NEUTROABS 5.8  --   --   --   HGB 7.6* 9.9* 9.3* 9.3*  HCT 24.9* 30.2* 29.1* 30*  MCV 85.3 82.7 83.1  --   PLT 611* 501* 456* 555*   Lab Results  Component Value Date   TSH 1.009 02/13/2017   Lab Results  Component Value Date   HGBA1C 5.6 06/17/2015   Lab Results  Component Value Date   CHOL 127 02/13/2017   HDL 42 02/13/2017   LDLCALC 75 02/13/2017   LDLDIRECT 173.0 04/11/2007   TRIG 52 02/13/2017   CHOLHDL 3.0 02/13/2017    Assessment/Plan Depression Emotional and upset due to not being able to go home with daughter. Evaluated by Psych today Wellbutrin adjusted. Continue to monitor for mood changes.   HTN B/p stable.Continue on metoprolol. Monitor BMP.   GERD Asymptomatic.Continue on Protonix.   Dementia without behavioral disturbance No new behaviors.continue on supplements and MVI. Continue to assist with ADL's.   Family/ staff Communication: Reviewed plan of care with patient and facility Nurse supervisor.   Labs/tests ordered:None    Patient ID: Adriana Middleton, female   DOB: 09-Jul-1930, 83 y.o.   MRN: [id number]

## 2017-04-05 DIAGNOSIS — H25812 Combined forms of age-related cataract, left eye: Secondary | ICD-10-CM | POA: Diagnosis not present

## 2017-04-05 DIAGNOSIS — H2512 Age-related nuclear cataract, left eye: Secondary | ICD-10-CM | POA: Diagnosis not present

## 2017-04-06 ENCOUNTER — Telehealth: Payer: Self-pay | Admitting: Oncology

## 2017-04-06 ENCOUNTER — Encounter: Payer: Self-pay | Admitting: Oncology

## 2017-04-06 NOTE — Telephone Encounter (Signed)
Appt has been scheduled for the pt to see Dr. Clelia CroftShadad on 5/25 at 2pm. Will fax a letter to the referring office w/appt date and time.

## 2017-04-20 DIAGNOSIS — M6281 Muscle weakness (generalized): Secondary | ICD-10-CM | POA: Diagnosis not present

## 2017-04-20 DIAGNOSIS — R296 Repeated falls: Secondary | ICD-10-CM | POA: Diagnosis not present

## 2017-04-21 DIAGNOSIS — R296 Repeated falls: Secondary | ICD-10-CM | POA: Diagnosis not present

## 2017-04-21 DIAGNOSIS — M6281 Muscle weakness (generalized): Secondary | ICD-10-CM | POA: Diagnosis not present

## 2017-04-22 DIAGNOSIS — M6281 Muscle weakness (generalized): Secondary | ICD-10-CM | POA: Diagnosis not present

## 2017-04-22 DIAGNOSIS — R296 Repeated falls: Secondary | ICD-10-CM | POA: Diagnosis not present

## 2017-04-23 ENCOUNTER — Ambulatory Visit (HOSPITAL_BASED_OUTPATIENT_CLINIC_OR_DEPARTMENT_OTHER): Payer: Medicare Other | Admitting: Oncology

## 2017-04-23 VITALS — BP 111/51 | HR 81 | Temp 98.1°F | Resp 18 | Ht 62.0 in | Wt 92.7 lb

## 2017-04-23 DIAGNOSIS — N289 Disorder of kidney and ureter, unspecified: Secondary | ICD-10-CM | POA: Diagnosis not present

## 2017-04-23 DIAGNOSIS — M6281 Muscle weakness (generalized): Secondary | ICD-10-CM | POA: Diagnosis not present

## 2017-04-23 DIAGNOSIS — D649 Anemia, unspecified: Secondary | ICD-10-CM | POA: Diagnosis not present

## 2017-04-23 DIAGNOSIS — R296 Repeated falls: Secondary | ICD-10-CM | POA: Diagnosis not present

## 2017-04-23 NOTE — Progress Notes (Signed)
Reason for Referral: Anemia.   HPI: 83 year old woman with history of COPD, hypertension and history of stroke referred to me for evaluation of anemia. She was hospitalized on 02/12/2017 after a repeated laboratory testing showed a hemoglobin of 6.2. She was evaluated in the emergency department her hemoglobin was 7.4 and received packed red cell transfusion. After 2 units of packed red cell transfusion hemoglobin was 9.9. She was also noted to have some renal insufficiency related to elevated calcium, BUN and creatinine. She was hydrated with normalization of her mental status. She denied any hematochezia or melena. She denied any hemoptysis or hematemesis.  Anemia workup while in the hospital was unrevealing. She does have chronic renal insufficiency with creatinine clearance of about 40 mL/m. Her iron levels were within normal range.  Since her discharge, she reports no major changes in her health. She has limited quality of life and she is chair bound. She does report some occasional dizziness but no chest pain, palpitation or shortness of breath. Her appetite is reasonable. She denied any pain. She denied any pathological fractures or peripheral neuropathy.  She does not report any headaches, blurry vision, syncope or seizures. She does not report any fevers or chills or sweats. She does not report any cough, wheezing or hemoptysis. She does not report any nausea or vomiting or abdominal pain. She is not report any hematochezia or melena. Remaining review of systems unremarkable.   Past Medical History:  Diagnosis Date  . ARTHRITIS, LEFT HIP 04/18/2008  . CATARACTS, BILATERAL 04/18/2008  . COPD 04/18/2008  . DEPRESSION 07/08/2007  . GERD 07/08/2007  . HYPERTENSION 07/08/2007  . Stroke ([place])   . Unspecified disorder of parathyroid gland 11/14/2010  :  Past Surgical History:  Procedure Laterality Date  . COLOSTOMY     partial  . EP IMPLANTABLE DEVICE N/A 06/18/2015   Procedure: Loop Recorder  Insertion;  Surgeon: [name] Lance, MD;  Location: [facility];  Service: Cardiovascular;  Laterality: N/A;  . TEE WITHOUT CARDIOVERSION N/A 06/18/2015   Procedure: TRANSESOPHAGEAL ECHOCARDIOGRAM (TEE);  Surgeon: Pixie Casino, MD;  Location: [facility] ENDOSCOPY;  Service: Cardiovascular;  Laterality: N/A;  :   Current Outpatient Prescriptions:  .  acetaminophen (TYLENOL) 325 MG tablet, Take 650 mg by mouth every 6 (six) hours as needed for mild pain or fever. Fever greater than  101, Disp: , Rfl:  .  Amino Acids-Protein Hydrolys (FEEDING SUPPLEMENT, PRO-STAT SUGAR FREE 64,) LIQD, Take 30 mLs by mouth daily. x30 days, Disp: , Rfl:  .  atorvastatin (LIPITOR) 20 MG tablet, Take 20 mg by mouth daily., Disp: , Rfl:  .  buPROPion (WELLBUTRIN XL) 150 MG 24 hr tablet, Take 1 tablet by mouth daily., Disp: , Rfl:  .  clopidogrel (PLAVIX) 75 MG tablet, Take 1 tablet (75 mg total) by mouth daily., Disp: , Rfl:  .  memantine (NAMENDA XR) 14 MG CP24 24 hr capsule, Take 1 capsule by mouth daily., Disp: , Rfl:  .  metoprolol succinate (TOPROL-XL) 25 MG 24 hr tablet, Take 0.5 tablets (12.5 mg total) by mouth daily., Disp: 90 tablet, Rfl: 3 .  pantoprazole (PROTONIX) 40 MG tablet, Take 1 tablet (40 mg total) by mouth daily., Disp: 30 tablet, Rfl: 1 .  prednisoLONE acetate (PRED FORTE) 1 % ophthalmic suspension, Place 1 drop into the left eye 4 (four) times daily., Disp: , Rfl: 6 .  Prenat-FeCbn-FeBisg-FA-Omega (MULTIVITAMIN/MINERALS PO), Take 1 tablet by mouth daily., Disp: , Rfl: :  Allergies  Allergen Reactions  . Codeine Itching  . Simvastatin Itching  :  Family History  Problem Relation Age of Onset  . Diabetes Other   . Hypertension Other   . Cancer Other        prostate  . Heart disease Other   :  Social History   Social History  . Marital status: Widowed    Spouse name: N/A  . Number of children: N/A  . Years of education: N/A   Occupational History  . Not on file.   Social  History Main Topics  . Smoking status: Former Research scientist (life sciences)  . Smokeless tobacco: Never Used     Comment: quit 15 yrs ago.    . Alcohol use No  . Drug use: No  . Sexual activity: Not on file   Other Topics Concern  . Not on file   Social History Narrative  . No narrative on file  :  Pertinent items are noted in HPI.  Exam: Blood pressure (!) 111/51, pulse 81, temperature 98.1 F (36.7 C), temperature source Oral, resp. rate 18, height _0  (1.575 m), weight 92 lb 11.2 oz (42 kg), SpO2 100 %. General appearance: Elderly frail woman appeared without distress. Throat: No oral thrush or ulcers. Neck: no adenopathy Back: negative Resp: clear to auscultation bilaterally Chest wall: no tenderness Cardio: regular rate and rhythm, S1, S2 normal, no murmur, click, rub or gallop GI: soft, non-tender; bowel sounds normal; no masses,  no organomegaly Extremities: extremities normal, atraumatic, no cyanosis or edema Pulses: 2+ and symmetric Skin: Skin color, texture, turgor normal. No rashes or lesions  CBC    Component Value Date/Time   WBC 12.7 02/22/2017   WBC 8.6 02/14/2017 0653   RBC 3.50 (L) 02/14/2017 0653   HGB 9.3 (A) 02/22/2017   HCT 30 (A) 02/22/2017   HCT 34.6 02/13/2016 1638   PLT 555 (A) 02/22/2017   PLT 244 02/13/2016 1638   MCV 83.1 02/14/2017 0653   MCV 97 02/13/2016 1638   MCH 26.6 02/14/2017 0653   MCHC 32.0 02/14/2017 0653   RDW 15.4 02/14/2017 0653   RDW 12.8 02/13/2016 1638   LYMPHSABS 0.7 02/12/2017 1040   LYMPHSABS 1.0 02/13/2016 1638   MONOABS 0.9 02/12/2017 1040   EOSABS 0.1 02/12/2017 1040   EOSABS 0.1 02/13/2016 1638   BASOSABS 0.0 02/12/2017 1040   BASOSABS 0.0 02/13/2016 1638     Chemistry      Component Value Date/Time   NA 141 02/22/2017   K 4.5 02/22/2017   CL 106 02/13/2017 0205   CO2 21 (L) 02/13/2017 0205   BUN 23 (A) 02/22/2017   CREATININE 1.0 02/22/2017   CREATININE 1.17 (H) 02/13/2017 0205   GLU 81 02/22/2017      Component  Value Date/Time   CALCIUM 10.1 02/13/2017 0205   CALCIUM 11.6 (H) 11/07/2010 2208   ALKPHOS 70 02/13/2017 0205   ALKPHOS 70 02/13/2017 0205   AST 27 02/13/2017 0205   AST 28 02/13/2017 0205   ALT 32 02/13/2017 0205   ALT 32 02/13/2017 0205   BILITOT 0.7 02/13/2017 0205   BILITOT 0.6 02/13/2017 0205   BILITOT 0.2 02/13/2016 1638       Assessment and Plan:   83 year old woman with the following issues:  1. Multifactorial anemia diagnosed in March 2018. Her anemia is related due to renal insufficiency, chronic disease and could be also related to MDS. Renal insufficiency is related due to poor by mouth intake and dehydration and  responds well to intravenous fluids. Plasma cell disorder is also a consideration.  To complete the workup and repeat serum protein electrophoresis, erythropoietin level, E39, folic acid, potentially a bone marrow biopsy and skeletal survey may be needed. I discussed this extensive workup with the patient today who refused any further testing at this time. She reports that she feels fine and does not consent to any further testing mowing forward.   It is reasonable to forego any further testing from a hematology standpoint. I do not think she would be a candidate for any other treatment other than supportive care in the future. I have recommended supportive transfusions in the future if needed to. If she develops symptoms of worsening anemia than a palliative transfusion would be a good option.  Given her age, limited performance status and quality of life I see limited value and aggressive measures at this time. Supportive and palliative care as her best option move forward.  2. Follow-up: I'm happy to see her in the future as needed.

## 2017-04-26 DIAGNOSIS — R296 Repeated falls: Secondary | ICD-10-CM | POA: Diagnosis not present

## 2017-04-26 DIAGNOSIS — M6281 Muscle weakness (generalized): Secondary | ICD-10-CM | POA: Diagnosis not present

## 2017-04-27 DIAGNOSIS — M6281 Muscle weakness (generalized): Secondary | ICD-10-CM | POA: Diagnosis not present

## 2017-04-27 DIAGNOSIS — R296 Repeated falls: Secondary | ICD-10-CM | POA: Diagnosis not present

## 2017-04-28 DIAGNOSIS — R296 Repeated falls: Secondary | ICD-10-CM | POA: Diagnosis not present

## 2017-04-28 DIAGNOSIS — M6281 Muscle weakness (generalized): Secondary | ICD-10-CM | POA: Diagnosis not present

## 2017-04-29 ENCOUNTER — Encounter: Payer: Self-pay | Admitting: Family

## 2017-04-29 ENCOUNTER — Non-Acute Institutional Stay (SKILLED_NURSING_FACILITY): Payer: Medicare Other | Admitting: Family

## 2017-04-29 DIAGNOSIS — F32A Depression, unspecified: Secondary | ICD-10-CM

## 2017-04-29 DIAGNOSIS — M6281 Muscle weakness (generalized): Secondary | ICD-10-CM | POA: Diagnosis not present

## 2017-04-29 DIAGNOSIS — F419 Anxiety disorder, unspecified: Secondary | ICD-10-CM | POA: Diagnosis not present

## 2017-04-29 DIAGNOSIS — F329 Major depressive disorder, single episode, unspecified: Secondary | ICD-10-CM | POA: Diagnosis not present

## 2017-04-29 DIAGNOSIS — R296 Repeated falls: Secondary | ICD-10-CM | POA: Diagnosis not present

## 2017-04-29 MED ORDER — ALPRAZOLAM 0.25 MG PO TABS: 0.2500 mg | ORAL_TABLET | Freq: Two times a day (BID) | ORAL | 0 refills | Status: AC | PRN

## 2017-04-29 NOTE — Progress Notes (Signed)
Location:  [facility] Room Number: 801B Place of Service:  SNF [phone]) Provider:  Richarda Blade, FNP-C  [name], MD  Patient Care Team: [name], MD as PCP - General [name])  Extended Emergency Contact Information Primary Emergency Contact: [name],[name] Address: [address]          [place], [place] [address] [place] Home Phone: [phone] Mobile Phone: [phone] Relation: Daughter Secondary Emergency Contact: [name] [place] of [place] Mobile Phone: [phone] Relation: Son  Code Status:  DNR Goals of care: Advanced Directive information Advanced Directives 04/29/2017  Does Patient Have a Medical Advance Directive? Yes  Type of Advance Directive Out of facility DNR (pink MOST or yellow form)  Does patient want to make changes to medical advance directive? -  Copy of Healthcare Power of Attorney in Chart? -  Would patient like information on creating a medical advance directive? -  Pre-existing out of facility DNR order (yellow form or pink MOST form) Yellow form placed in chart (order not valid for inpatient use)     Chief Complaint  Patient presents with  . Acute Visit    anxiety; patient's family asks for medication    HPI:  Pt is a 83 y.o. female seen today for acute visit for evaluation of increased anxiety. She has a significant medical history of HTN, Hyperlipidemia,Stroke, TIA, COPD, GERD, CKD stage 3,Dementia, Depression among other conditions. She is seen in her room today per facility Nurse request. Nurse reports patient's has been crying out loudly worse than usual. [name] request low dose antianxiety medication.patient states left hip leg pain under control but starts to cry out loudly saying wants to go home.    Past Medical History:  Diagnosis Date  . ARTHRITIS, LEFT HIP 04/18/2008  . CATARACTS, BILATERAL 04/18/2008  . COPD 04/18/2008  . DEPRESSION 07/08/2007  .  GERD 07/08/2007  . HYPERTENSION 07/08/2007  . Stroke ([facility])   . Unspecified disorder of parathyroid gland 11/14/2010   Past Surgical History:  Procedure Laterality Date  . COLOSTOMY     partial  . EP IMPLANTABLE DEVICE N/A 06/18/2015   Procedure: Loop Recorder Insertion;  Surgeon: Marinus Maw, MD;  Location: [facility];  Service: Cardiovascular;  Laterality: N/A;  . TEE WITHOUT CARDIOVERSION N/A 06/18/2015   Procedure: TRANSESOPHAGEAL ECHOCARDIOGRAM (TEE);  Surgeon: Chrystie Nose, MD;  Location: [facility] ENDOSCOPY;  Service: Cardiovascular;  Laterality: N/A;    Allergies  Allergen Reactions  . Codeine Itching  . Simvastatin Itching    Allergies as of 04/29/2017      Reactions   Codeine Itching   Simvastatin Itching      Medication List       Accurate as of 04/29/17  3:44 PM. Always use your most recent med list.          acetaminophen 325 MG tablet Commonly known as:  TYLENOL Take 650 mg by mouth every 6 (six) hours as needed for mild pain or fever. Fever greater than  101   atorvastatin 10 MG tablet Commonly known as:  LIPITOR Take 10 mg by mouth daily.   buPROPion 150 MG 24 hr tablet Commonly known as:  WELLBUTRIN XL Take 1 tablet by mouth daily.   clopidogrel 75 MG tablet Commonly known as:  PLAVIX Take 1 tablet (75 mg total) by mouth daily.   memantine 14 MG Cp24 24 hr capsule Commonly known as:  NAMENDA XR Take 1 capsule  by mouth daily.   metoprolol succinate 25 MG 24 hr tablet Commonly known as:  TOPROL-XL Take 0.5 tablets (12.5 mg total) by mouth daily.   multivitamin tablet Take 1 tablet by mouth daily.   pantoprazole 40 MG tablet Commonly known as:  PROTONIX Take 1 tablet (40 mg total) by mouth daily.   prednisoLONE acetate 1 % ophthalmic suspension Commonly known as:  PRED FORTE Place 1 drop into the left eye 2 (two) times daily.   PROLENSA 0.07 % Soln Generic drug:  Bromfenac Sodium Apply 1 drop to eye daily. Left eye (do not  substitute). Use until runs out   UNABLE TO FIND Take 120 mLs by mouth 2 (two) times daily. Med Name: Med Pass      Review of Systems  Constitutional: Negative for activity change, appetite change, chills, fatigue and fever.  HENT: Negative for congestion, rhinorrhea, sinus pain, sinus pressure, sneezing and sore throat.   Eyes: Negative.   Respiratory: Negative for cough, chest tightness, shortness of breath and wheezing.   Cardiovascular: Negative for chest pain, palpitations and leg swelling.  Gastrointestinal: Negative for abdominal distention, abdominal pain, constipation, diarrhea, nausea and vomiting.  Endocrine: Negative.   Genitourinary: Negative for dysuria, flank pain, frequency and urgency.  Musculoskeletal: Positive for gait problem.  Skin: Negative for color change, pallor and rash.  Neurological: Negative for dizziness, syncope, light-headedness and headaches.  Hematological: Does not bruise/bleed easily.  Psychiatric/Behavioral: Negative for agitation, confusion, hallucinations and sleep disturbance. The patient is anxious and restless.     Immunization History  Administered Date(s) Administered  . PPD Test 01/21/2017, 02/07/2017  . Pneumococcal Polysaccharide-23 11/30/2004  . Td 11/30/2004   Pertinent  Health Maintenance Due  Topic Date Due  . PNA vac Low Risk Adult (2 of 2 - PCV13) 11/30/2017 (Originally 11/30/2005)  . DEXA SCAN  04/02/2018 (Originally 02/13/1997)  . INFLUENZA VACCINE  06/30/2017    Vitals:   04/29/17 1206  BP: (!) 146/71  Pulse: 95  Resp: 18  Temp: 98 F (36.7 C)  SpO2: 100%  Weight: 91 lb 12.8 oz (41.6 kg)  Height: 5\' 2"  (1.575 m)   Body mass index is 16.79 kg/m. Physical Exam  Constitutional:  Thin frail elderly in no acute distress   HENT:  Head: Normocephalic.  Mouth/Throat: Oropharynx is clear and moist. No oropharyngeal exudate.  Eyes: Conjunctivae and EOM are normal. Pupils are equal, round, and reactive to light. Right  eye exhibits no discharge. Left eye exhibits no discharge. No scleral icterus.  Neck: Normal range of motion. No JVD present. No thyromegaly present.  Cardiovascular: Regular rhythm, normal heart sounds and intact distal pulses.  Exam reveals no gallop and no friction rub.   No murmur heard.   Pulmonary/Chest: Effort normal and breath sounds normal. No respiratory distress. She has no wheezes.  Abdominal: Soft. Bowel sounds are normal. She exhibits no distension. There is no tenderness. There is no rebound and no guarding.  Genitourinary:  Genitourinary Comments: Incontinent for both bowel and bladder.    Musculoskeletal: She exhibits no edema, tenderness or deformity.  Moves x 4 extremities. Unsteady gait   Lymphadenopathy:    She has no cervical adenopathy.  Neurological:  Alert and oriented x 2   Skin: Skin is warm and dry. No rash noted. No erythema. No pallor.  Psychiatric: crying loudly during visit tears running down her face.     Labs reviewed:  Recent Labs  01/21/17 0549  02/12/17 1040 02/13/17 0205 02/22/17  NA 138  < > 138 136 141  K 4.1  < > 3.9 4.2 4.5  CL 105  --  105 106  --   CO2 23  --  24 21*  --   GLUCOSE 84  --  106* 89  --   BUN 20  < > 23* 20 23*  CREATININE 1.35*  < > 1.26* 1.17* 1.0  CALCIUM 10.9*  --  10.5* 10.1  --   < > = values in this interval not displayed.  Recent Labs  12/22/16 1454 02/01/17 02/11/17 02/13/17 0205  AST 25 61* 26 27  28   ALT 15 65* 32 32  32  ALKPHOS 85 84 81 70  70  BILITOT 0.6  --   --  0.7  0.6  PROT 7.3  --   --  6.4*  6.3*  ALBUMIN 4.0  --   --  2.1*  2.1*    Recent Labs  02/12/17 1040 02/13/17 0205 02/14/17 0653 02/22/17  WBC 7.5 8.5 8.6 12.7  NEUTROABS 5.8  --   --   --   HGB 7.6* 9.9* 9.3* 9.3*  HCT 24.9* 30.2* 29.1* 30*  MCV 85.3 82.7 83.1  --   PLT 611* 501* 456* 555*   Lab Results  Component Value Date   TSH 1.009 02/13/2017   Lab Results  Component Value Date   HGBA1C 5.6 06/17/2015    Lab Results  Component Value Date   CHOL 127 02/13/2017   HDL 42 02/13/2017   LDLCALC 75 02/13/2017   LDLDIRECT 173.0 04/11/2007   TRIG 52 02/13/2017   CHOLHDL 3.0 02/13/2017   Assessment/Plan Anxiety  Crying out loudly during visit and while at the dinning area. [name] request low dose of antianxiety. Will start on Alprazolam 0.25 mg Tablet one by mouth twice daily as needed for anxiety or restlessness.    Depression Crying out loudly at the dinning area states wants to go home.Evaluated by Psych 04/15/2017 Wellbutrin increased to 150 mg Tablet daily. Continue to monitor for mood changes.continue to follow up with Psych service.    Family/ staff Communication: Reviewed plan of care with patient and facility Nurse supervisor.   Labs/tests ordered:None    Patient ID: Adriana Middleton, female   DOB: July 02, 1930, 83 y.o.   MRN: [id number]

## 2017-04-30 DIAGNOSIS — M6281 Muscle weakness (generalized): Secondary | ICD-10-CM | POA: Diagnosis not present

## 2017-04-30 DIAGNOSIS — R296 Repeated falls: Secondary | ICD-10-CM | POA: Diagnosis not present

## 2017-05-03 DIAGNOSIS — M6281 Muscle weakness (generalized): Secondary | ICD-10-CM | POA: Diagnosis not present

## 2017-05-03 DIAGNOSIS — R296 Repeated falls: Secondary | ICD-10-CM | POA: Diagnosis not present

## 2017-05-04 DIAGNOSIS — M6281 Muscle weakness (generalized): Secondary | ICD-10-CM | POA: Diagnosis not present

## 2017-05-04 DIAGNOSIS — R296 Repeated falls: Secondary | ICD-10-CM | POA: Diagnosis not present

## 2017-05-05 DIAGNOSIS — R296 Repeated falls: Secondary | ICD-10-CM | POA: Diagnosis not present

## 2017-05-05 DIAGNOSIS — M6281 Muscle weakness (generalized): Secondary | ICD-10-CM | POA: Diagnosis not present

## 2017-05-06 DIAGNOSIS — R296 Repeated falls: Secondary | ICD-10-CM | POA: Diagnosis not present

## 2017-05-06 DIAGNOSIS — M6281 Muscle weakness (generalized): Secondary | ICD-10-CM | POA: Diagnosis not present

## 2017-05-07 DIAGNOSIS — M6281 Muscle weakness (generalized): Secondary | ICD-10-CM | POA: Diagnosis not present

## 2017-05-07 DIAGNOSIS — R296 Repeated falls: Secondary | ICD-10-CM | POA: Diagnosis not present

## 2017-05-10 DIAGNOSIS — R296 Repeated falls: Secondary | ICD-10-CM | POA: Diagnosis not present

## 2017-05-10 DIAGNOSIS — M6281 Muscle weakness (generalized): Secondary | ICD-10-CM | POA: Diagnosis not present

## 2017-05-11 DIAGNOSIS — R296 Repeated falls: Secondary | ICD-10-CM | POA: Diagnosis not present

## 2017-05-11 DIAGNOSIS — M6281 Muscle weakness (generalized): Secondary | ICD-10-CM | POA: Diagnosis not present

## 2017-05-12 DIAGNOSIS — M6281 Muscle weakness (generalized): Secondary | ICD-10-CM | POA: Diagnosis not present

## 2017-05-12 DIAGNOSIS — R296 Repeated falls: Secondary | ICD-10-CM | POA: Diagnosis not present

## 2017-05-13 DIAGNOSIS — R296 Repeated falls: Secondary | ICD-10-CM | POA: Diagnosis not present

## 2017-05-13 DIAGNOSIS — M6281 Muscle weakness (generalized): Secondary | ICD-10-CM | POA: Diagnosis not present

## 2017-05-14 DIAGNOSIS — M6281 Muscle weakness (generalized): Secondary | ICD-10-CM | POA: Diagnosis not present

## 2017-05-14 DIAGNOSIS — R296 Repeated falls: Secondary | ICD-10-CM | POA: Diagnosis not present

## 2017-05-17 DIAGNOSIS — M6281 Muscle weakness (generalized): Secondary | ICD-10-CM | POA: Diagnosis not present

## 2017-05-17 DIAGNOSIS — R296 Repeated falls: Secondary | ICD-10-CM | POA: Diagnosis not present

## 2017-05-19 ENCOUNTER — Emergency Department (HOSPITAL_COMMUNITY): Payer: Medicare Other

## 2017-05-19 ENCOUNTER — Encounter (HOSPITAL_COMMUNITY): Payer: Self-pay | Admitting: Emergency Medicine

## 2017-05-19 ENCOUNTER — Emergency Department (HOSPITAL_COMMUNITY)
Admission: EM | Admit: 2017-05-19 | Discharge: 2017-05-20 | Disposition: A | Discharge status: Rehabilitation Facility | Payer: Medicare Other | Attending: Emergency Medicine | Admitting: Emergency Medicine

## 2017-05-19 DIAGNOSIS — F32A Depression, unspecified: Secondary | ICD-10-CM

## 2017-05-19 DIAGNOSIS — I129 Hypertensive chronic kidney disease with stage 1 through stage 4 chronic kidney disease, or unspecified chronic kidney disease: Secondary | ICD-10-CM | POA: Insufficient documentation

## 2017-05-19 DIAGNOSIS — K5900 Constipation, unspecified: Secondary | ICD-10-CM | POA: Insufficient documentation

## 2017-05-19 DIAGNOSIS — Z87891 Personal history of nicotine dependence: Secondary | ICD-10-CM | POA: Diagnosis not present

## 2017-05-19 DIAGNOSIS — J449 Chronic obstructive pulmonary disease, unspecified: Secondary | ICD-10-CM | POA: Diagnosis not present

## 2017-05-19 DIAGNOSIS — Z79899 Other long term (current) drug therapy: Secondary | ICD-10-CM | POA: Insufficient documentation

## 2017-05-19 DIAGNOSIS — N183 Chronic kidney disease, stage 3 (moderate): Secondary | ICD-10-CM | POA: Insufficient documentation

## 2017-05-19 DIAGNOSIS — F329 Major depressive disorder, single episode, unspecified: Secondary | ICD-10-CM | POA: Insufficient documentation

## 2017-05-19 LAB — COMPREHENSIVE METABOLIC PANEL
ALT: 9 U/L — ABNORMAL LOW (ref 14–54)
AST: 17 U/L (ref 15–41)
Albumin: 2.7 g/dL — ABNORMAL LOW (ref 3.5–5.0)
Alkaline Phosphatase: 103 U/L (ref 38–126)
Anion gap: 7 (ref 5–15)
BUN: 30 mg/dL — ABNORMAL HIGH (ref 6–20)
CO2: 26 mmol/L (ref 22–32)
Calcium: 10.3 mg/dL (ref 8.9–10.3)
Chloride: 108 mmol/L (ref 101–111)
Creatinine, Ser: 1.21 mg/dL — ABNORMAL HIGH (ref 0.44–1.00)
GFR calc Af Amer: 46 mL/min — ABNORMAL LOW (ref >60)
GFR calc non Af Amer: 40 mL/min — ABNORMAL LOW (ref >60)
Glucose, Bld: 95 mg/dL (ref 65–99)
Potassium: 4.3 mmol/L (ref 3.5–5.1)
Sodium: 141 mmol/L (ref 135–145)
Total Bilirubin: 0.2 mg/dL — ABNORMAL LOW (ref 0.3–1.2)
Total Protein: 7.2 g/dL (ref 6.5–8.1)

## 2017-05-19 LAB — CBC WITH DIFFERENTIAL/PLATELET
Basophils Absolute: 0 10*3/uL (ref 0.0–0.1)
Basophils Relative: 1 %
Eosinophils Absolute: 0.2 10*3/uL (ref 0.0–0.7)
Eosinophils Relative: 3 %
HCT: 30.6 % — ABNORMAL LOW (ref 36.0–46.0)
Hemoglobin: 9 g/dL — ABNORMAL LOW (ref 12.0–15.0)
Lymphocytes Relative: 21 %
Lymphs Abs: 1.4 10*3/uL (ref 0.7–4.0)
MCH: 25.6 pg — ABNORMAL LOW (ref 26.0–34.0)
MCHC: 29.4 g/dL — ABNORMAL LOW (ref 30.0–36.0)
MCV: 86.9 fL (ref 78.0–100.0)
Monocytes Absolute: 0.5 10*3/uL (ref 0.1–1.0)
Monocytes Relative: 8 %
Neutro Abs: 4.6 10*3/uL (ref 1.7–7.7)
Neutrophils Relative %: 67 %
Platelets: 380 10*3/uL (ref 150–400)
RBC: 3.52 MIL/uL — ABNORMAL LOW (ref 3.87–5.11)
RDW: 17.2 % — ABNORMAL HIGH (ref 11.5–15.5)
WBC: 6.6 10*3/uL (ref 4.0–10.5)

## 2017-05-19 MED ORDER — POLYETHYLENE GLYCOL 3350 17 G PO PACK: PACK | ORAL | 0 refills | Status: AC

## 2017-05-19 NOTE — ED Notes (Signed)
Acuity 4, see provider assessment

## 2017-05-19 NOTE — ED Provider Notes (Signed)
[facility]-EMERGENCY DEPT Provider Note   CSN: [id number] Arrival date & time: 05/19/17  1957     History   Chief Complaint Chief Complaint  Patient presents with  . Depression  . Constipation   Level V caveat due to dementia HPI Adriana Middleton is a 83 y.o. female with history of dementia, COPD, depression, GERD, HTN, CKD who presents a with chief complaint depression and constipation. Per triage note, [facility] staff stated that patient stated that she was ready to die and see her parents. Per nursing home notes, "patient stated that she will kill herself". Apparently first responders stated that SPO2 was low 80%. Per EMS on reevaluation, patient SPO2 100% on room air. According to triage note, patient complains of no bowel movement in 2 days. When asked, patient states that she has no plan or intent to commit suicide, however states that she has committed suicide in the past. She denies ingestion, SI, HI at this time. Patient states that she feels "very tired".  While in the ED, patient's daughter (and [place]) presented. She states that patient has been depressed for over 9 months. She states that she is crying constantly and does not want to be in the nursing home. She states that patient is at times somewhat lucid and will recognize her, but typically is A&Ox1 to person only. She does state that the patient frequently threatens to commit suicide, but that "this is only a threat ". She states that patient has never attempted suicide to her knowledge. She also states that patient is wheelchair bound, and would have very little opportunity to attempt suicide in the nursing home.  The history is provided by the patient.    Past Medical History:  Diagnosis Date  . ARTHRITIS, LEFT HIP 04/18/2008  . CATARACTS, BILATERAL 04/18/2008  . COPD 04/18/2008  . DEPRESSION 07/08/2007  . GERD 07/08/2007  . HYPERTENSION 07/08/2007  . Stroke (HCC)   . Unspecified disorder of parathyroid gland  11/14/2010    Patient Active Problem List   Diagnosis Date Noted  . Pressure injury of skin 02/14/2017  . Symptomatic anemia 02/12/2017  . Acute kidney injury (HCC) 02/12/2017  . Abnormal TSH 02/12/2017  . Dementia 02/12/2017  . CKD (chronic kidney disease), stage III 02/12/2017  . Abnormal transaminases 02/12/2017  . Dehydration 01/19/2017  . Renal failure (ARF), acute on chronic (HCC)   . Troponin level elevated 09/22/2015  . Acute renal failure (ARF) (HCC) 09/22/2015  . Hypercalcemia 09/22/2015  . Acute encephalopathy 09/22/2015  . DNR (do not resuscitate) 09/22/2015  . Palliative care status 09/22/2015  . Hypokalemia 09/22/2015  . Loss of weight 09/22/2015  . Embolic stroke (HCC) 07/19/2015  . Aortic atherosclerosis (HCC)   . Carotid stenosis   . History of CVA in adulthood   . HLD (hyperlipidemia)   . Left-sided weakness 06/16/2015  . Acute right arterial ischemic stroke, MCA (middle cerebral artery) (HCC) 06/16/2015  . Facial weakness 06/10/2015  . TIA (transient ischemic attack) 06/10/2015  . Disorder of parathyroid gland (HCC) 11/14/2010  . CATARACTS, BILATERAL 04/18/2008  . COPD (chronic obstructive pulmonary disease) (HCC) 04/18/2008  . Arthropathy of pelvic region and thigh 04/18/2008  . Depression 07/08/2007  . Essential hypertension 07/08/2007  . GERD 07/08/2007    Past Surgical History:  Procedure Laterality Date  . COLOSTOMY     partial  . EP IMPLANTABLE DEVICE N/A 06/18/2015   Procedure: Loop Recorder Insertion;  Surgeon: Marinus Maw, MD;  Location: [facility]  INVASIVE CV LAB;  Service: Cardiovascular;  Laterality: N/A;  . TEE WITHOUT CARDIOVERSION N/A 06/18/2015   Procedure: TRANSESOPHAGEAL ECHOCARDIOGRAM (TEE);  Surgeon: Chrystie Nose, MD;  Location: [facility] ENDOSCOPY;  Service: Cardiovascular;  Laterality: N/A;    OB History    No data available       Home Medications    Prior to Admission medications   Medication Sig Start Date End Date Taking?  Authorizing Provider  acetaminophen (TYLENOL) 325 MG tablet Take 650 mg by mouth every 6 (six) hours as needed for mild pain or fever. Fever greater than  101    [provider]  ALPRAZolam (XANAX) 0.25 MG tablet Take 1 tablet (0.25 mg total) by mouth 2 (two) times daily as needed for anxiety. 04/29/17   Ngetich, Dinah C, NP  atorvastatin (LIPITOR) 10 MG tablet Take 10 mg by mouth daily.    [provider]  Bromfenac Sodium (PROLENSA) 0.07 % SOLN Apply 1 drop to eye daily. Left eye (do not substitute). Use until runs out    [provider]  buPROPion (WELLBUTRIN XL) 150 MG 24 hr tablet Take 1 tablet by mouth daily. 04/02/17   [provider]  clopidogrel (PLAVIX) 75 MG tablet Take 1 tablet (75 mg total) by mouth daily. 07/19/15   Micki Riley, MD  memantine (NAMENDA XR) 14 MG CP24 24 hr capsule Take 1 capsule by mouth daily. 04/21/17   [provider]  metoprolol succinate (TOPROL-XL) 25 MG 24 hr tablet Take 0.5 tablets (12.5 mg total) by mouth daily. 03/04/17   Ngetich, Dinah C, NP  Multiple Vitamin (MULTIVITAMIN) tablet Take 1 tablet by mouth daily.    [provider]  pantoprazole (PROTONIX) 40 MG tablet Take 1 tablet (40 mg total) by mouth daily. 02/15/17   Richarda Overlie, MD  polyethylene glycol (MIRALAX / GLYCOLAX) packet Take 17 g of powdered dissolved in 8 ounces of water once daily 05/19/17   Emilyann Banka A, PA-C  prednisoLONE acetate (PRED FORTE) 1 % ophthalmic suspension Place 1 drop into the left eye 2 (two) times daily.  04/06/17   [provider]  UNABLE TO FIND Take 120 mLs by mouth 2 (two) times daily. Med Name: Med Pass    [provider]    Family History Family History  Problem Relation Age of Onset  . Diabetes Other   . Hypertension Other   . Cancer Other        prostate  . Heart disease Other     Social History Social History  Substance Use Topics  . Smoking status: Former Games developer  . Smokeless tobacco:  Never Used     Comment: quit 15 yrs ago.    . Alcohol use No     Allergies   Codeine and Simvastatin   Review of Systems Review of Systems  Unable to perform ROS: Dementia     Physical Exam Updated Vital Signs BP (!) 132/53   Pulse 79   Temp 98.5 F (36.9 C) (Oral)   Resp 18   SpO2 100%   Physical Exam  Constitutional: She appears well-developed and well-nourished. No distress.  Thin, but resting comfortably in bed  HENT:  Head: Normocephalic and atraumatic.  Eyes: EOM are normal. Pupils are equal, round, and reactive to light. Right eye exhibits no discharge. Left eye exhibits no discharge.  Neck: No JVD present. No tracheal deviation present.  Cardiovascular: Normal rate, regular rhythm, normal heart sounds and intact distal pulses.  2+ radial pulses bilaterally  Pulmonary/Chest: Effort normal and breath sounds normal. She exhibits no tenderness.  Abdominal: Soft. Bowel sounds are normal. She exhibits no distension. There is no tenderness.  Murphy's absent  Neurological: No cranial nerve deficit or sensory deficit.  Appears sleepy, but easily arousable. Oriented only to person.  Skin: Skin is warm and dry. Capillary refill takes less than 2 seconds. She is not diaphoretic.  Psychiatric: She has a normal mood and affect. Her behavior is normal.     ED Treatments / Results  Labs (all labs ordered are listed, but only abnormal results are displayed) Labs Reviewed  CBC WITH DIFFERENTIAL/PLATELET - Abnormal; Notable for the following:       Result Value   RBC 3.52 (*)    Hemoglobin 9.0 (*)    HCT 30.6 (*)    MCH 25.6 (*)    MCHC 29.4 (*)    RDW 17.2 (*)    All other components within normal limits  COMPREHENSIVE METABOLIC PANEL - Abnormal; Notable for the following:    BUN 30 (*)    Creatinine, Ser 1.21 (*)    Albumin 2.7 (*)    ALT 9 (*)    Total Bilirubin 0.2 (*)    GFR calc non Af Amer 40 (*)    GFR calc Af Amer 46 (*)    All other components within  normal limits    EKG  EKG Interpretation  Date/Time:  Wednesday May 19 2017 20:59:37 EDT Ventricular Rate:  83 PR Interval:    QRS Duration: 71 QT Interval:  371 QTC Calculation: 436 R Axis:   -31 Text Interpretation:  Sinus rhythm Left axis deviation No significant change was found Confirmed by Azalia Bilis ([id number]) on 05/19/2017 10:50:08 PM        Radiology Dg Abdomen Acute W/chest  Result Date: 05/19/2017 CLINICAL DATA:  Constipation and weakness for 2 days. EXAM: DG ABDOMEN ACUTE W/ 1V CHEST COMPARISON:  CT 12/22/2016 FINDINGS: The heart is enlarged. There is tortuosity and atherosclerosis of the thoracic aorta. No pulmonary edema or focal airspace disease. Implanted loop recorder projects over the left chest wall. No evidence of free air. No dilated bowel loops to suggest obstruction. Moderate to large colonic stool burden suggesting constipation. There intra-abdominal vascular calcifications. The bones are under mineralized. Compression fracture of T12 appears chronic and grossly unchanged from prior CT. Chronic deformity of both hips, left greater than right. IMPRESSION: 1. Moderate to large stool burden suggesting constipation. No evidence of obstruction or free air. 2. Cardiomegaly without acute pulmonary process. 3. Thoracoabdominal aortic atherosclerosis. Electronically Signed   By: Rubye Oaks M.D.   On: 05/19/2017 21:54    Procedures Procedures (including critical care time)  Medications Ordered in ED Medications - No data to display   Initial Impression / Assessment and Plan / ED Course  I have reviewed the triage vital signs and the nursing notes.  Pertinent labs & imaging results that were available during my care of the patient were reviewed by me and considered in my medical decision making (see chart for details).     Patient with history of dementia presents today with depression and constipation. She is at her mental status baseline per daughter, no  focal neurological deficits noted. No tenderness on examination. EKG shows no significant change from last, lab work shows stable mild anemia and note significant change from lab work from last CMP. Radiographs show moderate to large stool burden suggesting constipation with no evidence  of obstruction or free air. Low suspicion of infectious process. On reevaluation, patient is resting comfortably.  At this time, I do not believe that patient is a danger to herself or others. She stable for discharge back to the nursing home with addition of MiraLAX for treatment of constipation. Spoke with patient's daughter regarding treatment plan and follow-up. Discussed indications for return to the ED. [name] verbalize understanding of and agreement with plan and patient is stable for discharge back to the nursing home at this time.  Final Clinical Impressions(s) / ED Diagnoses   Final diagnoses:  Constipation, unspecified constipation type  Depression, unspecified depression type    New Prescriptions New Prescriptions   POLYETHYLENE GLYCOL (MIRALAX / GLYCOLAX) PACKET    Take 17 g of powdered dissolved in 8 ounces of water once daily     Bennye AlmFawze, Chablis Losh A, PA-C 05/20/17 [id number]    Azalia Bilisampos, Kevin, MD 05/20/17 [phone]

## 2017-05-19 NOTE — ED Triage Notes (Signed)
Pt arrives via EMS from [facility]. Staff states patient stated she was ready to die and see her mom and dad. Staff alerted authorities and EMS. On arrival rescue stated O2 was lower 80s. Per ems 100 percent on RA. Pt c/o of no BM in 2 days

## 2017-05-19 NOTE — Discharge Instructions (Signed)
Take MiraLAX daily for the next 2 weeks for treatment of constipation. Drink plenty of fluids and eat foods that are rich in fiber. Follow-up with primary care for reevaluation. Return to the ED if any concerning signs or symptoms develop.

## 2017-05-20 DIAGNOSIS — R100 Acute abdomen: Secondary | ICD-10-CM | POA: Diagnosis not present

## 2017-05-20 DIAGNOSIS — K5900 Constipation, unspecified: Secondary | ICD-10-CM | POA: Diagnosis not present

## 2017-05-20 NOTE — ED Notes (Signed)
Pt unable to sign, too sleepy at present, discharge instructions reviewed and gone over with PTAR and patient. [facility] to [facility]

## 2017-05-26 DIAGNOSIS — H2512 Age-related nuclear cataract, left eye: Secondary | ICD-10-CM | POA: Diagnosis not present

## 2017-06-03 DIAGNOSIS — Z79899 Other long term (current) drug therapy: Secondary | ICD-10-CM | POA: Diagnosis not present

## 2017-06-15 DIAGNOSIS — K5900 Constipation, unspecified: Secondary | ICD-10-CM | POA: Diagnosis not present

## 2017-06-22 DIAGNOSIS — M159 Polyosteoarthritis, unspecified: Secondary | ICD-10-CM | POA: Diagnosis not present

## 2017-06-23 DIAGNOSIS — R488 Other symbolic dysfunctions: Secondary | ICD-10-CM | POA: Diagnosis not present

## 2017-06-23 DIAGNOSIS — R1312 Dysphagia, oropharyngeal phase: Secondary | ICD-10-CM | POA: Diagnosis not present

## 2017-06-24 DIAGNOSIS — N390 Urinary tract infection, site not specified: Secondary | ICD-10-CM | POA: Diagnosis not present

## 2017-06-24 DIAGNOSIS — R488 Other symbolic dysfunctions: Secondary | ICD-10-CM | POA: Diagnosis not present

## 2017-06-24 DIAGNOSIS — R1312 Dysphagia, oropharyngeal phase: Secondary | ICD-10-CM | POA: Diagnosis not present

## 2017-06-25 DIAGNOSIS — R1312 Dysphagia, oropharyngeal phase: Secondary | ICD-10-CM | POA: Diagnosis not present

## 2017-06-25 DIAGNOSIS — R488 Other symbolic dysfunctions: Secondary | ICD-10-CM | POA: Diagnosis not present

## 2017-06-28 DIAGNOSIS — R488 Other symbolic dysfunctions: Secondary | ICD-10-CM | POA: Diagnosis not present

## 2017-06-28 DIAGNOSIS — R1312 Dysphagia, oropharyngeal phase: Secondary | ICD-10-CM | POA: Diagnosis not present

## 2017-06-29 DIAGNOSIS — R1312 Dysphagia, oropharyngeal phase: Secondary | ICD-10-CM | POA: Diagnosis not present

## 2017-06-29 DIAGNOSIS — Z961 Presence of intraocular lens: Secondary | ICD-10-CM | POA: Diagnosis not present

## 2017-06-29 DIAGNOSIS — R488 Other symbolic dysfunctions: Secondary | ICD-10-CM | POA: Diagnosis not present

## 2017-09-30 DEATH — deceased

## 2018-09-19 IMAGING — CR DG ABDOMEN ACUTE W/ 1V CHEST
4 series · 4 of 4 positions shown · non-contrast
Comparison: CT 12/22/2016

CLINICAL DATA: Constipation and weakness for 2 days.

EXAM:
DG ABDOMEN ACUTE W/ 1V CHEST

[chest pa]
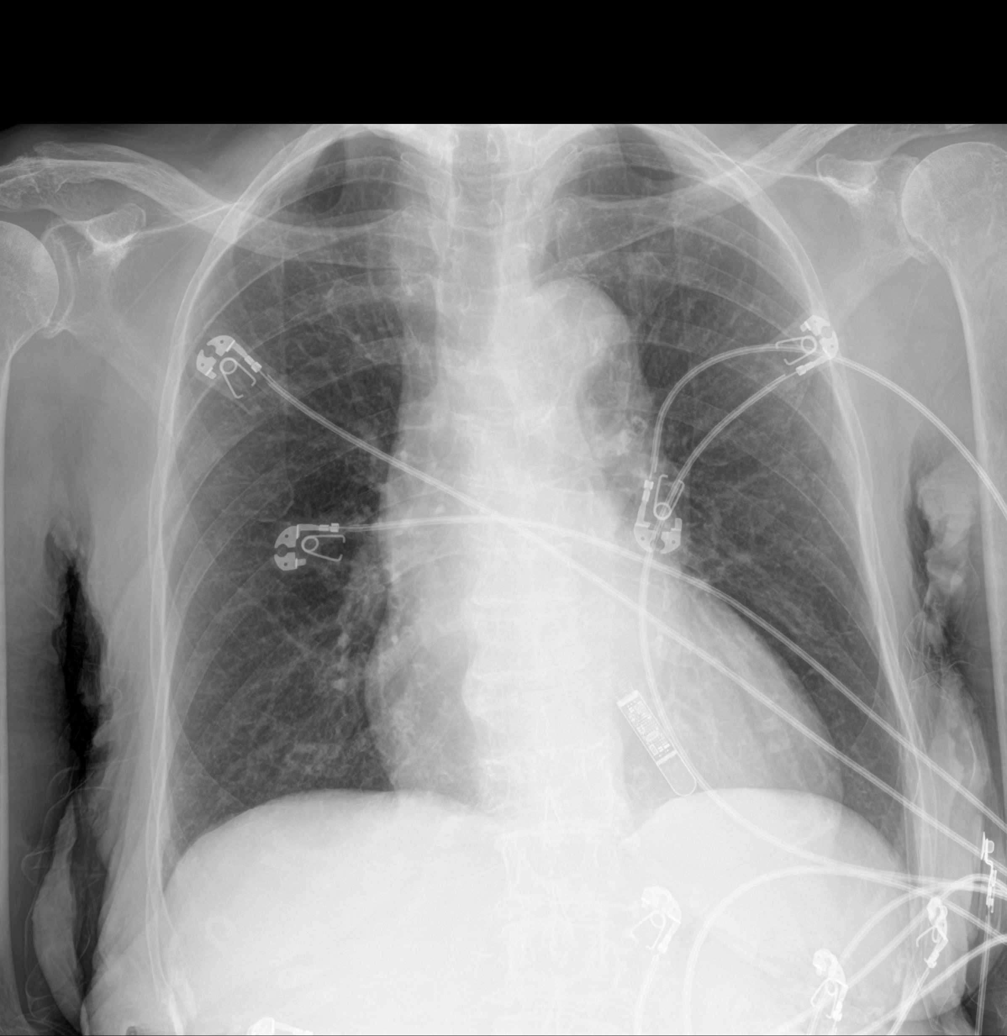

[abdomen erect]
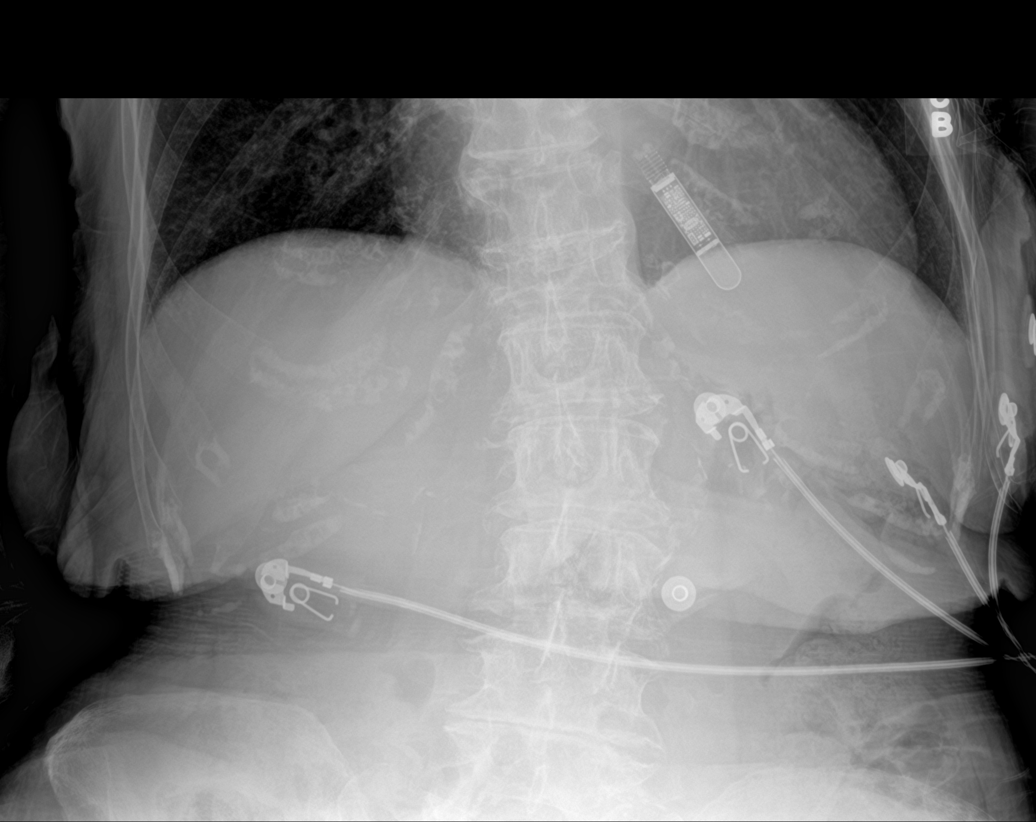

[abdomen supine (1 of 2)]
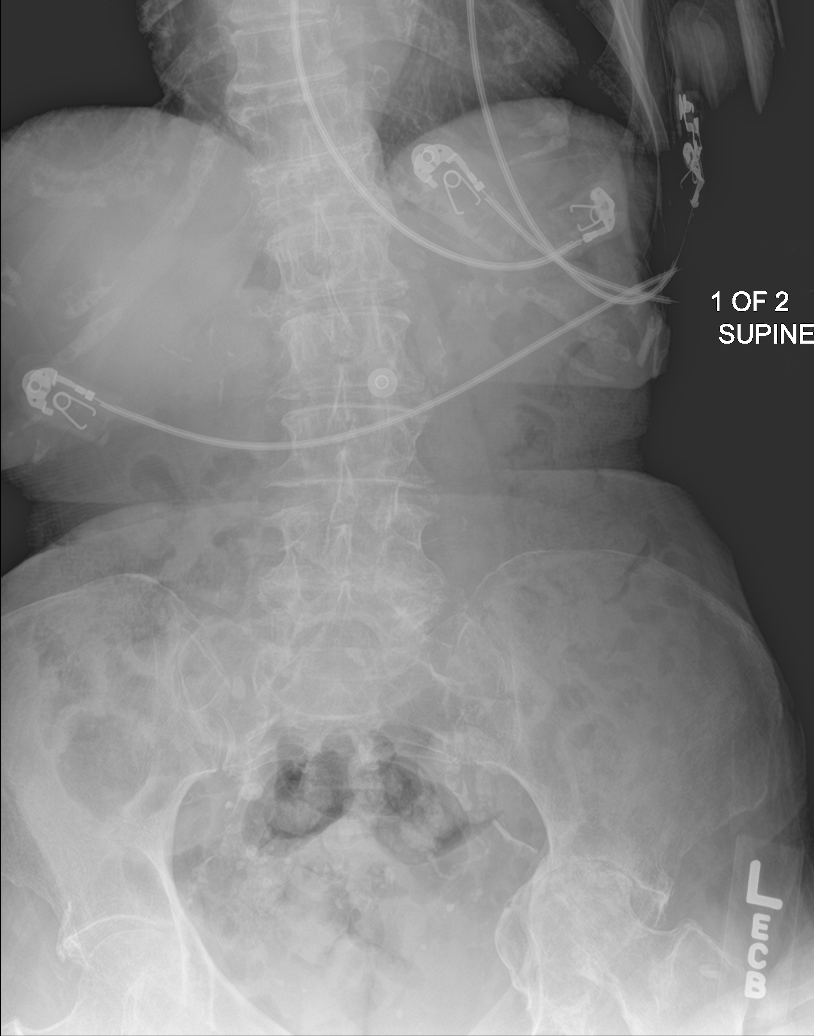

[abdomen supine (2 of 2)]
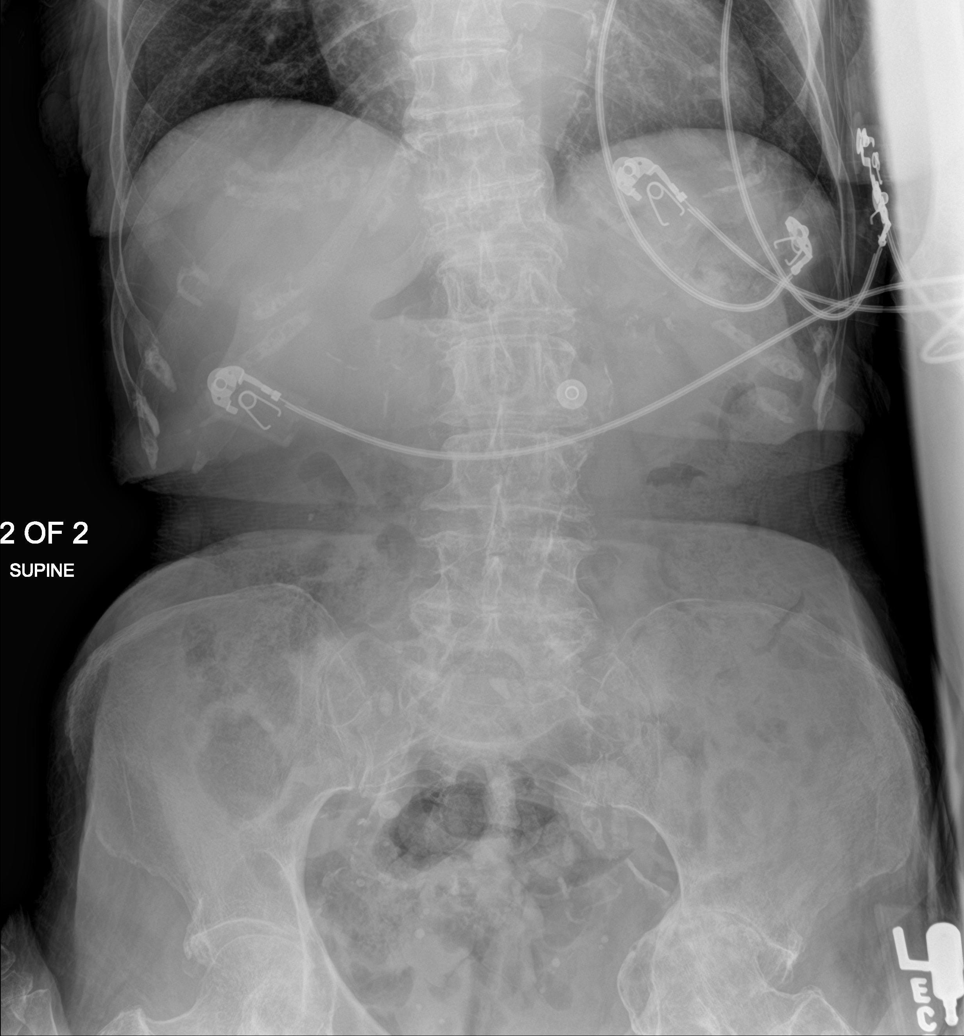

[4 of 4 positions shown; findings below may reference images not displayed]

FINDINGS: The heart is enlarged. There is tortuosity and atherosclerosis of
the thoracic aorta. No pulmonary edema or focal airspace disease.
Implanted loop recorder projects over the left chest wall.

No evidence of free air. No dilated bowel loops to suggest
obstruction. Moderate to large colonic stool burden suggesting
constipation. There intra-abdominal vascular calcifications. The
bones are under mineralized. Compression fracture of T12 appears
chronic and grossly unchanged from prior CT. Chronic deformity of
both hips, left greater than right.
IMPRESSION: 1. Moderate to large stool burden suggesting constipation. No
evidence of obstruction or free air.
2. Cardiomegaly without acute pulmonary process.
3. Thoracoabdominal aortic atherosclerosis.
